# Patient Record
Sex: Male | Born: 1950
Health system: Southern US, Community
[De-identification: ages and names within clinical notes are randomized; demographics above are authoritative.]

## PROBLEM LIST (undated history)

## (undated) DIAGNOSIS — R7301 Impaired fasting glucose: Secondary | ICD-10-CM

## (undated) DIAGNOSIS — K625 Hemorrhage of anus and rectum: Secondary | ICD-10-CM

## (undated) DIAGNOSIS — N200 Calculus of kidney: Secondary | ICD-10-CM

## (undated) DIAGNOSIS — K219 Gastro-esophageal reflux disease without esophagitis: Secondary | ICD-10-CM

## (undated) DIAGNOSIS — T07XXXA Unspecified multiple injuries, initial encounter: Secondary | ICD-10-CM

## (undated) DIAGNOSIS — C4490 Unspecified malignant neoplasm of skin, unspecified: Secondary | ICD-10-CM

## (undated) DIAGNOSIS — K648 Other hemorrhoids: Secondary | ICD-10-CM

## (undated) HISTORY — DX: Impaired fasting glucose: R73.01

## (undated) HISTORY — PX: COLONOSCOPY: SHX174

## (undated) HISTORY — DX: Unspecified malignant neoplasm of skin, unspecified: C44.90

## (undated) HISTORY — DX: Gastro-esophageal reflux disease without esophagitis: K21.9

## (undated) HISTORY — PX: POLYPECTOMY: SHX149

## (undated) HISTORY — DX: Unspecified multiple injuries, initial encounter: T07.XXXA

## (undated) HISTORY — DX: Other hemorrhoids: K64.8

## (undated) HISTORY — DX: Calculus of kidney: N20.0

## (undated) HISTORY — DX: Gilbert syndrome: E80.4

## (undated) HISTORY — DX: Hemorrhage of anus and rectum: K62.5

---

## 2001-09-29 ENCOUNTER — Encounter: Admission: RE | Admit: 2001-09-29 | Discharge: 2001-09-29 | Payer: Self-pay | Admitting: Internal Medicine

## 2001-09-29 ENCOUNTER — Encounter: Payer: Self-pay | Admitting: Internal Medicine

## 2001-10-01 HISTORY — PX: UPPER GASTROINTESTINAL ENDOSCOPY: SHX188

## 2001-10-01 HISTORY — PX: COLONOSCOPY: SHX174

## 2001-10-01 HISTORY — PX: CHOLECYSTECTOMY: SHX55

## 2003-09-06 ENCOUNTER — Encounter: Payer: Self-pay | Admitting: Internal Medicine

## 2005-01-08 ENCOUNTER — Ambulatory Visit: Payer: Self-pay | Admitting: Internal Medicine

## 2005-01-15 ENCOUNTER — Encounter: Admission: RE | Admit: 2005-01-15 | Discharge: 2005-01-15 | Payer: Self-pay | Admitting: Internal Medicine

## 2005-01-15 ENCOUNTER — Ambulatory Visit: Payer: Self-pay | Admitting: Internal Medicine

## 2005-03-30 ENCOUNTER — Ambulatory Visit: Payer: Self-pay | Admitting: Internal Medicine

## 2005-10-01 HISTORY — PX: LITHOTRIPSY: SUR834

## 2005-11-26 ENCOUNTER — Ambulatory Visit: Payer: Self-pay | Admitting: Internal Medicine

## 2005-11-29 ENCOUNTER — Encounter: Admission: RE | Admit: 2005-11-29 | Discharge: 2005-11-29 | Payer: Self-pay | Admitting: Internal Medicine

## 2006-02-04 ENCOUNTER — Ambulatory Visit: Payer: Self-pay | Admitting: Internal Medicine

## 2006-07-12 ENCOUNTER — Ambulatory Visit: Payer: Self-pay | Admitting: Internal Medicine

## 2006-09-09 ENCOUNTER — Ambulatory Visit: Payer: Self-pay | Admitting: Internal Medicine

## 2006-09-09 LAB — CONVERTED CEMR LAB
ALT: 28 units/L (ref 0–40)
AST: 26 units/L (ref 0–37)
BUN: 29 mg/dL — ABNORMAL HIGH (ref 6–23)
Chol/HDL Ratio, serum: 4.8
Cholesterol: 159 mg/dL (ref 0–200)
Creatinine, Ser: 1.3 mg/dL (ref 0.4–1.5)
Glucose, Bld: 101 mg/dL — ABNORMAL HIGH (ref 70–99)
HDL: 33.1 mg/dL — ABNORMAL LOW (ref >39.0)
LDL Cholesterol: 95 mg/dL (ref 0–99)
PSA: 0.39 ng/mL (ref 0.10–4.00)
Potassium: 3.9 meq/L (ref 3.5–5.1)
Triglyceride fasting, serum: 153 mg/dL — ABNORMAL HIGH (ref 0–149)
VLDL: 31 mg/dL (ref 0–40)

## 2006-12-30 ENCOUNTER — Ambulatory Visit: Payer: Self-pay | Admitting: Internal Medicine

## 2007-02-25 DIAGNOSIS — Z87442 Personal history of urinary calculi: Secondary | ICD-10-CM | POA: Insufficient documentation

## 2007-02-25 DIAGNOSIS — M545 Low back pain: Secondary | ICD-10-CM

## 2007-04-07 ENCOUNTER — Ambulatory Visit: Payer: Self-pay | Admitting: Internal Medicine

## 2007-04-13 LAB — CONVERTED CEMR LAB
ALT: 22 units/L (ref 0–53)
AST: 21 units/L (ref 0–37)
Albumin: 3.8 g/dL (ref 3.5–5.2)
Alkaline Phosphatase: 55 units/L (ref 39–117)
BUN: 20 mg/dL (ref 6–23)
Basophils Absolute: 0 10*3/uL (ref 0.0–0.1)
Basophils Relative: 0.6 % (ref 0.0–1.0)
Bilirubin, Direct: 0.1 mg/dL (ref 0.0–0.3)
CO2: 26 meq/L (ref 19–32)
Calcium: 9 mg/dL (ref 8.4–10.5)
Chloride: 110 meq/L (ref 96–112)
Cholesterol: 157 mg/dL (ref 0–200)
Creatinine, Ser: 1.2 mg/dL (ref 0.4–1.5)
Direct LDL: 90 mg/dL
Eosinophils Absolute: 0.1 10*3/uL (ref 0.0–0.6)
Eosinophils Relative: 2.5 % (ref 0.0–5.0)
GFR calc Af Amer: 81 mL/min
GFR calc non Af Amer: 67 mL/min
Glucose, Bld: 108 mg/dL — ABNORMAL HIGH (ref 70–99)
HCT: 43.5 % (ref 39.0–52.0)
HDL: 24.2 mg/dL — ABNORMAL LOW (ref >39.0)
Hemoglobin: 15 g/dL (ref 13.0–17.0)
Lymphocytes Relative: 22.6 % (ref 12.0–46.0)
MCHC: 34.4 g/dL (ref 30.0–36.0)
MCV: 90.3 fL (ref 78.0–100.0)
Monocytes Absolute: 0.4 10*3/uL (ref 0.2–0.7)
Monocytes Relative: 7.6 % (ref 3.0–11.0)
Neutro Abs: 3.8 10*3/uL (ref 1.4–7.7)
Neutrophils Relative %: 66.7 % (ref 43.0–77.0)
Platelets: 138 10*3/uL — ABNORMAL LOW (ref 150–400)
Potassium: 3.9 meq/L (ref 3.5–5.1)
RBC: 4.82 M/uL (ref 4.22–5.81)
RDW: 12 % (ref 11.5–14.6)
Sodium: 142 meq/L (ref 135–145)
TSH: 0.93 microintl units/mL (ref 0.35–5.50)
Total Bilirubin: 1.5 mg/dL — ABNORMAL HIGH (ref 0.3–1.2)
Total CHOL/HDL Ratio: 6.5
Total Protein: 6.1 g/dL (ref 6.0–8.3)
Triglycerides: 269 mg/dL (ref 0–149)
VLDL: 54 mg/dL — ABNORMAL HIGH (ref 0–40)
WBC: 5.6 10*3/uL (ref 4.5–10.5)

## 2007-04-14 ENCOUNTER — Encounter (INDEPENDENT_AMBULATORY_CARE_PROVIDER_SITE_OTHER): Payer: Self-pay | Admitting: *Deleted

## 2007-04-21 ENCOUNTER — Ambulatory Visit: Payer: Self-pay | Admitting: Internal Medicine

## 2007-04-21 DIAGNOSIS — N401 Enlarged prostate with lower urinary tract symptoms: Secondary | ICD-10-CM

## 2007-04-21 DIAGNOSIS — T887XXA Unspecified adverse effect of drug or medicament, initial encounter: Secondary | ICD-10-CM | POA: Insufficient documentation

## 2007-04-24 ENCOUNTER — Encounter (INDEPENDENT_AMBULATORY_CARE_PROVIDER_SITE_OTHER): Payer: Self-pay | Admitting: *Deleted

## 2007-09-01 ENCOUNTER — Encounter: Payer: Self-pay | Admitting: Internal Medicine

## 2007-09-08 ENCOUNTER — Ambulatory Visit: Payer: Self-pay | Admitting: Internal Medicine

## 2007-09-08 DIAGNOSIS — E785 Hyperlipidemia, unspecified: Secondary | ICD-10-CM | POA: Insufficient documentation

## 2007-09-08 DIAGNOSIS — E1169 Type 2 diabetes mellitus with other specified complication: Secondary | ICD-10-CM | POA: Insufficient documentation

## 2007-09-08 DIAGNOSIS — I10 Essential (primary) hypertension: Secondary | ICD-10-CM

## 2007-09-08 DIAGNOSIS — K219 Gastro-esophageal reflux disease without esophagitis: Secondary | ICD-10-CM

## 2007-10-17 ENCOUNTER — Encounter (INDEPENDENT_AMBULATORY_CARE_PROVIDER_SITE_OTHER): Payer: Self-pay | Admitting: *Deleted

## 2007-10-17 ENCOUNTER — Ambulatory Visit: Payer: Self-pay | Admitting: Internal Medicine

## 2007-11-10 ENCOUNTER — Ambulatory Visit: Payer: Self-pay | Admitting: Gastroenterology

## 2007-12-22 ENCOUNTER — Encounter: Payer: Self-pay | Admitting: Internal Medicine

## 2007-12-29 ENCOUNTER — Encounter: Payer: Self-pay | Admitting: Gastroenterology

## 2007-12-29 ENCOUNTER — Ambulatory Visit: Payer: Self-pay | Admitting: Gastroenterology

## 2008-09-22 ENCOUNTER — Telehealth: Payer: Self-pay | Admitting: Internal Medicine

## 2008-09-27 ENCOUNTER — Ambulatory Visit: Payer: Self-pay | Admitting: Internal Medicine

## 2008-09-27 ENCOUNTER — Telehealth (INDEPENDENT_AMBULATORY_CARE_PROVIDER_SITE_OTHER): Payer: Self-pay | Admitting: *Deleted

## 2008-09-28 LAB — CONVERTED CEMR LAB: PSA: 0.37 ng/mL (ref 0.10–4.00)

## 2008-09-29 ENCOUNTER — Encounter (INDEPENDENT_AMBULATORY_CARE_PROVIDER_SITE_OTHER): Payer: Self-pay | Admitting: *Deleted

## 2008-11-04 ENCOUNTER — Ambulatory Visit: Payer: Self-pay | Admitting: Internal Medicine

## 2008-11-04 DIAGNOSIS — N1831 Chronic kidney disease, stage 3a: Secondary | ICD-10-CM | POA: Insufficient documentation

## 2008-11-04 DIAGNOSIS — R7303 Prediabetes: Secondary | ICD-10-CM | POA: Insufficient documentation

## 2008-11-04 DIAGNOSIS — R739 Hyperglycemia, unspecified: Secondary | ICD-10-CM

## 2008-11-04 LAB — CONVERTED CEMR LAB
Cholesterol, target level: 200 mg/dL
LDL Goal: 130 mg/dL

## 2008-11-05 ENCOUNTER — Encounter: Payer: Self-pay | Admitting: Internal Medicine

## 2008-11-15 ENCOUNTER — Ambulatory Visit: Payer: Self-pay | Admitting: Internal Medicine

## 2008-12-04 LAB — CONVERTED CEMR LAB
ALT: 24 units/L (ref 0–53)
AST: 25 units/L (ref 0–37)
Eosinophils Relative: 2.4 % (ref 0.0–5.0)
HCT: 43.7 % (ref 39.0–52.0)
Hemoglobin: 15.6 g/dL (ref 13.0–17.0)
Hgb A1c MFr Bld: 4.9 % (ref 4.6–6.0)
LDL Cholesterol: 108 mg/dL — ABNORMAL HIGH (ref 0–99)
Lymphocytes Relative: 22.8 % (ref 12.0–46.0)
Monocytes Absolute: 0.3 10*3/uL (ref 0.1–1.0)
Monocytes Relative: 5.3 % (ref 3.0–12.0)
Neutro Abs: 4.4 10*3/uL (ref 1.4–7.7)
Potassium: 3.9 meq/L (ref 3.5–5.1)
RBC: 4.78 M/uL (ref 4.22–5.81)
TSH: 0.9 microintl units/mL (ref 0.35–5.50)
Total Bilirubin: 1.4 mg/dL — ABNORMAL HIGH (ref 0.3–1.2)
Total CHOL/HDL Ratio: 5.8
Total Protein: 6.5 g/dL (ref 6.0–8.3)
WBC: 6.5 10*3/uL (ref 4.5–10.5)

## 2008-12-07 ENCOUNTER — Encounter (INDEPENDENT_AMBULATORY_CARE_PROVIDER_SITE_OTHER): Payer: Self-pay | Admitting: *Deleted

## 2009-08-04 ENCOUNTER — Telehealth (INDEPENDENT_AMBULATORY_CARE_PROVIDER_SITE_OTHER): Payer: Self-pay | Admitting: *Deleted

## 2009-08-09 ENCOUNTER — Telehealth (INDEPENDENT_AMBULATORY_CARE_PROVIDER_SITE_OTHER): Payer: Self-pay | Admitting: *Deleted

## 2009-08-17 ENCOUNTER — Encounter: Payer: Self-pay | Admitting: Internal Medicine

## 2009-09-29 ENCOUNTER — Ambulatory Visit: Payer: Self-pay | Admitting: Internal Medicine

## 2009-09-30 LAB — CONVERTED CEMR LAB: PSA: 0.49 ng/mL (ref 0.10–4.00)

## 2009-10-03 ENCOUNTER — Encounter (INDEPENDENT_AMBULATORY_CARE_PROVIDER_SITE_OTHER): Payer: Self-pay | Admitting: *Deleted

## 2009-11-07 ENCOUNTER — Ambulatory Visit: Payer: Self-pay | Admitting: Internal Medicine

## 2009-11-07 DIAGNOSIS — Z85828 Personal history of other malignant neoplasm of skin: Secondary | ICD-10-CM

## 2009-11-07 DIAGNOSIS — D126 Benign neoplasm of colon, unspecified: Secondary | ICD-10-CM

## 2009-11-14 ENCOUNTER — Ambulatory Visit: Payer: Self-pay | Admitting: Internal Medicine

## 2009-11-14 DIAGNOSIS — J069 Acute upper respiratory infection, unspecified: Secondary | ICD-10-CM | POA: Insufficient documentation

## 2009-11-18 LAB — CONVERTED CEMR LAB
Albumin: 3.7 g/dL (ref 3.5–5.2)
Basophils Relative: 0.2 % (ref 0.0–3.0)
CO2: 30 meq/L (ref 19–32)
Calcium: 8.9 mg/dL (ref 8.4–10.5)
Creatinine, Ser: 1.1 mg/dL (ref 0.4–1.5)
Eosinophils Absolute: 0.1 10*3/uL (ref 0.0–0.7)
Eosinophils Relative: 1.5 % (ref 0.0–5.0)
GFR calc non Af Amer: 73.22 mL/min (ref >60)
Glucose, Bld: 93 mg/dL (ref 70–99)
HDL: 32.2 mg/dL — ABNORMAL LOW (ref >39.00)
Hemoglobin: 14.6 g/dL (ref 13.0–17.0)
Lymphocytes Relative: 24.7 % (ref 12.0–46.0)
Monocytes Relative: 10.7 % (ref 3.0–12.0)
Neutro Abs: 2.4 10*3/uL (ref 1.4–7.7)
Neutrophils Relative %: 62.9 % (ref 43.0–77.0)
PSA: 0.41 ng/mL (ref 0.10–4.00)
RBC: 4.55 M/uL (ref 4.22–5.81)
Sodium: 143 meq/L (ref 135–145)
TSH: 1.47 microintl units/mL (ref 0.35–5.50)
Total CHOL/HDL Ratio: 4
Total Protein: 6.4 g/dL (ref 6.0–8.3)
Triglycerides: 151 mg/dL — ABNORMAL HIGH (ref 0.0–149.0)
VLDL: 30.2 mg/dL (ref 0.0–40.0)
WBC: 3.9 10*3/uL — ABNORMAL LOW (ref 4.5–10.5)

## 2010-10-29 LAB — CONVERTED CEMR LAB
Hgb A1c MFr Bld: 4.5 % — ABNORMAL LOW (ref 4.6–6.0)
Microalb Creat Ratio: 1.4 mg/g (ref 0.0–30.0)
PSA: 0.52 ng/mL (ref 0.10–4.00)

## 2010-10-31 NOTE — Assessment & Plan Note (Signed)
Summary: MED REFILL//PH   Vital Signs:  Patient profile:   60 year old male Height:      72 inches Weight:      246.6 pounds BMI:     33.57 Temp:     98.4 degrees F oral Pulse rate:   82 / minute Resp:     16 per minute BP sitting:   140 / 82  (left arm) Cuff size:   large  Vitals Entered By: Shonna Chock (November 07, 2009 1:09 PM)  CC: Yearly follow-up on medication, Discuss when colonoscopy should be rechecked, General Medical Evaluation Comments REVIEWED MED LIST, PATIENT AGREED DOSE AND INSTRUCTION CORRECT    CC:  Yearly follow-up on medication, Discuss when colonoscopy should be rechecked, and General Medical Evaluation.  History of Present Illness: Richard Trevino is here for a physical ; he is asymptomatic except for minor headaches . No Rx to date; he questions relationship to decreased vision.  Allergies: 1)  ! * Niaspan  Past History:  Past Medical History: Low back pain Multiple fractures:coccyx, finger,hand [phone] abnormal blood chemistry nec,790.29 (fasting hyperglycemia) hyperplasia of prostate  w/o urinary obstruction Hyperlipidemia Hypertension nephrolithiasis, history of X 2 in  2004, 2006 Gilberts syndrome  GERD Skin cancer, hx of, Basal Cell X1, Dr. Maree Erie  Past Surgical History: Endoscopy  :gastritis 2003; lithrotripsy 2007, Dr Edwin Cap Cholecystectomy 1994 Colon polypectomy 2003,Dr Russella Dar, ? due 2013 Endo neg 2009  Family History: Father: emphysema, brown lung,?HTN,CVA; Mother:myeloma ,HTN,arthritis; Siblings:bro  skin CA, prostate CA, CVA,depression,cirrhosis; M uncles MI ( not pre 77)  Social History: Married Former Smoker: quit @ 18 Alcohol use-no No diet Occupation:Truck Driver  Review of Systems       The patient complains of vision loss.  The patient denies anorexia, fever, decreased hearing, hoarseness, chest pain, syncope, dyspnea on exertion, prolonged cough, hemoptysis, abdominal pain, melena, hematochezia, severe  indigestion/heartburn, hematuria, incontinence, suspicious skin lesions, depression, unusual weight change, abnormal bleeding, enlarged lymph nodes, and angioedema.         Weight up 6# over past year. Hand stiff while driving, ? minimal swelling.  Physical Exam  General:  well-nourished,in no acute distress; alert,appropriate and cooperative throughout examination Head:  Normocephalic and atraumatic without obvious abnormalities. pattern  alopecia ; moustache & beard Eyes:  No corneal or conjunctival inflammation noted.Perrla. Funduscopic exam benign, without hemorrhages, exudates or papilledema.  Ears:  External ear exam shows no significant lesions or deformities.  Otoscopic examination reveals clear canals, tympanic membranes are intact bilaterally without bulging, retraction, inflammation or discharge. Hearing is grossly normal bilaterally. Nose:  External nasal examination shows no deformity or inflammation. Nasal mucosa are pink and moist without lesions or exudates. Mouth:  Oral mucosa and oropharynx without lesions or exudates.  Teeth in good repair. Neck:  No deformities, masses, or tenderness noted. Lungs:  Normal respiratory effort, chest expands symmetrically. Lungs are clear to auscultation, no crackles or wheezes. Heart:  Normal rate and regular rhythm. S1 and S2 normal without gallop, murmur, click, rub . S4 with slurring Abdomen:  Bowel sounds positive,abdomen soft and non-tender without masses, organomegaly or hernias noted. Rectal:  No external abnormalities noted. Normal sphincter tone. No rectal masses or tenderness. Genitalia:  Testes bilaterally descended without nodularity, tenderness or masses. No scrotal masses or lesions. No penis lesions or urethral discharge. Prostate:  Prostate gland firm and smooth, no enlargement, nodularity, tenderness, mass, asymmetry or induration. Msk:  No deformity or scoliosis noted of thoracic or lumbar spine.  Pulses:  R and L  carotid,radial,dorsalis pedis and posterior tibial pulses are full and equal bilaterally Extremities:  No clubbing, cyanosis, edema, or deformity noted with normal full range of motion of all joints.  Minor fungal toenail change Neurologic:  alert & oriented X3 and DTRs symmetrical and normal.   Skin:  Intact without suspicious lesions or rashes.Op scar RU back , well healed. Minor boss above OD Cervical Nodes:  No lymphadenopathy noted Axillary Nodes:  No palpable lymphadenopathy Inguinal Nodes:  No significant adenopathy Psych:  memory intact for recent and remote, normally interactive, and good eye contact.     Impression & Recommendations:  Problem # 1:  PREVENTIVE HEALTH CARE (ICD-V70.0)  Orders: EKG w/ Interpretation (93000)  Problem # 2:  FASTING HYPERGLYCEMIA (ICD-790.29)  Problem # 3:  HYPERTENSION (ICD-401.9)  His updated medication list for this problem includes:    Hydrochlorothiazide 25 Mg Tabs (Hydrochlorothiazide) .Marland Kitchen... 1/2 tab qd  Orders: EKG w/ Interpretation (93000)  Problem # 4:  HYPERLIPIDEMIA (ICD-272.4)  His updated medication list for this problem includes:    Pravachol 20 Mg Tabs (Pravastatin sodium) .Marland Kitchen... Take 1/2 tab qhs  Problem # 5:  NEOPLASM, MALIGNANT, PROSTATE, FAMILY HX (ICD-V16.42) brother  Problem # 6:  SKIN CANCER, HX OF (ICD-V10.83) as per Derm  Problem # 7:  COLONIC POLYPS (ICD-211.3)  Complete Medication List: 1)  Pravachol 20 Mg Tabs (Pravastatin sodium) .... Take 1/2 tab qhs 2)  Fluoxetine Hcl 20 Mg Caps (Fluoxetine hcl) .Marland Kitchen.. 1 by mouth qd 3)  Hydrochlorothiazide 25 Mg Tabs (Hydrochlorothiazide) .... 1/2 tab qd 4)  Losartan Potassium 100 Mg  .... 1/2 -1  once daily to keep bp < 135/85 on average 5)  Flomax 0.4 Mg Cp24 (Tamsulosin hcl) .Marland Kitchen.. 1 by mouth qd 6)  Gnp Flax Seed Oil 1000 Mg Caps (Flaxseed (linseed)) .... Daily 7)  Kapidex 60 Mg Cpdr (Dexlansoprazole) .Marland Kitchen.. 1 q am 8)  Daily Multi Tabs (Multiple vitamins-minerals) .Marland Kitchen.. 1 by  mouth once daily 9)  Benadryl 25 Mg Tabs (Diphenhydramine hcl) .... 3 by mouth once daily  Patient Instructions: 1)  Avoid foods high in acid (tomatoes, citrus juices, spicy foods). Avoid eating within two hours of lying down or before exercising. Do not over eat; try smaller more frequent meals. Elevate head of bed twelve inches when sleeping. Verify when Colonoscopy due with Dr Ardell Isaacs. Schedule fasting labs: 2)  BMP ;Hepatic Panel ;Lipid Panel ;TSH ;CBC w/ Diff ;PSA ;HbgA1C . Consume LESS THAN 40 grams of sugar/ day from foods & drinks with High Fructose Corn Syrup as #1,2 or #3 on label. 3)  Check your Blood Pressure regularly. If it is above:135/85  you should icrease Losartan to 2100mg  once daily from 50 mg once daily  Prescriptions: KAPIDEX 60 MG CPDR (DEXLANSOPRAZOLE) 1 q am  #30 x 2   Entered and Authorized by:   Marga Melnick MD   Signed by:   Marga Melnick MD on 11/07/2009   Method used:   Print then Mail to Patient   RxID:   [id number] FLOMAX 0.4 MG  CP24 (TAMSULOSIN HCL) 1 by mouth qd  #90 Capsule x 3   Entered and Authorized by:   Marga Melnick MD   Signed by:   Marga Melnick MD on 11/07/2009   Method used:   Print then Mail to Patient   RxID:   [id number] HYDROCHLOROTHIAZIDE 25 MG  TABS (HYDROCHLOROTHIAZIDE) 1/2 tab qd  #90 x 1   Entered and Authorized by:  Marga Melnick MD   Signed by:   Marga Melnick MD on 11/07/2009   Method used:   Print then Mail to Patient   RxID:   [id number] FLUOXETINE HCL 20 MG  CAPS (FLUOXETINE HCL) 1 by mouth qd  #90 Capsule x 3   Entered and Authorized by:   Marga Melnick MD   Signed by:   Marga Melnick MD on 11/07/2009   Method used:   Print then Mail to Patient   RxID:   [id number] PRAVACHOL 20 MG  TABS (PRAVASTATIN SODIUM) TAKE 1/2 tab qhs  #90 x 1   Entered and Authorized by:   Marga Melnick MD   Signed by:   Marga Melnick MD on 11/07/2009   Method used:   Print then Mail to Patient   RxID:    [id number] LOSARTAN POTASSIUM 100 MG 1/2 -1  once daily to keep BP < 135/85 on AVERAGE  #30 x 11   Entered and Authorized by:   Marga Melnick MD   Signed by:   Marga Melnick MD on 11/07/2009   Method used:   Print then Mail to Patient   RxID:   [id number]    Immunization History:  Tetanus/Td Immunization History:    Tetanus/Td:  historical (03/30/2005)

## 2010-10-31 NOTE — Letter (Signed)
Summary: Results Follow up Letter  [facility] [place]  [address] [place], [place] [address]   Phone: [phone]  Fax: [phone]    10/03/2009 MRN: [id number]  Richard Trevino [address] [place], [place]  [address]  Dear Mr. CRAVER,  The following are the results of your recent test(s):  Test         Result    Pap Smear:        Normal _____  Not Normal _____ Comments: ______________________________________________________ Cholesterol: LDL(Bad cholesterol):         Your goal is less than:         HDL (Good cholesterol):       Your goal is more than: Comments:  ______________________________________________________ Mammogram:        Normal _____  Not Normal _____ Comments:  ___________________________________________________________________ Hemoccult:        Normal _____  Not normal _______ Comments:    _____________________________________________________________________ Other Tests: Please see attached labs done on 09/29/2009    We routinely do not discuss normal results over the telephone.  If you desire a copy of the results, or you have any questions about this information we can discuss them at your next office visit.   Sincerely,

## 2010-10-31 NOTE — Assessment & Plan Note (Signed)
Summary: possible flu//lch   Vital Signs:  Patient profile:   60 year old male Weight:      242 pounds Temp:     98.7 degrees F oral Pulse rate:   84 / minute Resp:     14 per minute BP sitting:   124 / 78  (left arm) Cuff size:   large  Vitals Entered By: Shonna Chock (November 14, 2009 2:56 PM) CC: Cough, fatigue, wheezing, tightness in head(headache) and a fever( x 1 day-Thursday) Comments REVIEWED MED LIST, PATIENT AGREED DOSE AND INSTRUCTION CORRECT    CC:  Cough, fatigue, wheezing, and tightness in head(headache) and a fever( x 1 day-Thursday).  History of Present Illness: Onset 11/08/2009 as NP cough , malaise. Fever to ? 02/10 with chills.Now  residual frontal headache, NP cough with wheezing. Rx: OTC flu meds. He does not take flu shot.  Allergies: 1)  ! * Niaspan  Review of Systems General:  Complains of fever and sweats; denies chills. ENT:  Complains of nasal congestion and sinus pressure; Facial pain w/o purulence. Resp:  Complains of cough; denies shortness of breath and sputum productive; No purulence.  Physical Exam  General:  well-nourished,in no acute distress; alert,appropriate and cooperative throughout examination Ears:  External ear exam shows no significant lesions or deformities.  Otoscopic examination reveals clear canals, tympanic membranes are intact bilaterally without bulging, retraction, inflammation or discharge. Hearing is grossly normal bilaterally. Nose:  External nasal examination shows no deformity or inflammation. Nasal mucosa are pink and moist without lesions or exudates. septal dislocation Mouth:  Oral mucosa and oropharynx without lesions or exudates.  Teeth in good repair. Lungs:  Normal respiratory effort, chest expands symmetrically. Lungs are clear to auscultation, no crackles or wheezes. Minor cough Cervical Nodes:  No lymphadenopathy noted Axillary Nodes:  No palpable lymphadenopathy   Impression & Recommendations:  Problem #  1:  BRONCHITIS-ACUTE (ICD-466.0)  His updated medication list for this problem includes:    Azithromycin 250 Mg Tabs (Azithromycin) .Marland Kitchen... As per pack  Problem # 2:  URI (ICD-465.9)  His updated medication list for this problem includes:    Benadryl 25 Mg Tabs (Diphenhydramine hcl) .Marland KitchenMarland KitchenMarland KitchenMarland Kitchen 3 by mouth once daily  Complete Medication List: 1)  Pravachol 20 Mg Tabs (Pravastatin sodium) .... Take 1/2 tab qhs 2)  Fluoxetine Hcl 20 Mg Caps (Fluoxetine hcl) .Marland Kitchen.. 1 by mouth qd 3)  Hydrochlorothiazide 25 Mg Tabs (Hydrochlorothiazide) .... 1/2 tab qd 4)  Losartan Potassium 100 Mg  .... 1/2 -1  once daily to keep bp < 135/85 on average 5)  Flomax 0.4 Mg Cp24 (Tamsulosin hcl) .Marland Kitchen.. 1 by mouth qd 6)  Gnp Flax Seed Oil 1000 Mg Caps (Flaxseed (linseed)) .... Daily 7)  Dexilant 60 Mg Cpdr (dexlansoprazole)  .Marland Kitchen.. 1 q am 8)  Daily Multi Tabs (Multiple vitamins-minerals) .Marland Kitchen.. 1 by mouth once daily 9)  Benadryl 25 Mg Tabs (Diphenhydramine hcl) .... 3 by mouth once daily 10)  Azithromycin 250 Mg Tabs (Azithromycin) .... As per pack  Patient Instructions: 1)  Advair  (sample) one inhalation every 12 hrs ; gargle & spit after use. Cough syrup if no better with Advair. 2)  Drink as much fluid as you can tolerate for the next few days. Prescriptions: AZITHROMYCIN 250 MG TABS (AZITHROMYCIN) as per pack  #1 x 0   Entered and Authorized by:   Marga Melnick MD   Signed by:   Marga Melnick MD on 11/14/2009   Method used:   Arneta Cliche  to ..Marland Kitchen       [address] [phone]* (retail)       [address]       [place], [place]  [address]       Ph: [phone]       Fax: [phone]   RxID:   [id number]

## 2010-11-20 ENCOUNTER — Encounter (INDEPENDENT_AMBULATORY_CARE_PROVIDER_SITE_OTHER): Payer: Self-pay | Admitting: *Deleted

## 2010-11-28 NOTE — Letter (Signed)
Summary: Primary Care Appointment Letter  [facility] [place]  [address] [place], [place] [address]   Phone: [phone]  Fax: [phone]    11/20/2010 MRN: [id number]  NECO KLING [address] [place], [place]  [address]  Dear Mr. HASZ,   Your Primary Care Physician Marga Melnick MD has indicated that:    ___X____it is time to schedule an appointment(Last office visit 11/2009, please call to schedule appointment) An appointment is necessary to continue refilling meds .    _______you missed your appointment on______ and need to call and          reschedule.    _______you need to have lab work done.    _______you need to schedule an appointment discuss lab or test results.    _______you need to call to reschedule your appointment that is                       scheduled on _________.     Please call our office as soon as possible. Our phone number is [phone]. Please press option 1. Our office is open 8a-5p, Monday through Friday.     Thank you,    [facility]

## 2010-12-05 ENCOUNTER — Encounter: Payer: Self-pay | Admitting: Internal Medicine

## 2010-12-28 ENCOUNTER — Other Ambulatory Visit: Payer: Self-pay | Admitting: Internal Medicine

## 2011-01-18 ENCOUNTER — Other Ambulatory Visit: Payer: Self-pay | Admitting: Internal Medicine

## 2011-01-22 ENCOUNTER — Ambulatory Visit (INDEPENDENT_AMBULATORY_CARE_PROVIDER_SITE_OTHER): Payer: 59 | Admitting: Internal Medicine

## 2011-01-22 ENCOUNTER — Encounter: Payer: Self-pay | Admitting: Internal Medicine

## 2011-01-22 DIAGNOSIS — R079 Chest pain, unspecified: Secondary | ICD-10-CM

## 2011-01-22 DIAGNOSIS — E785 Hyperlipidemia, unspecified: Secondary | ICD-10-CM

## 2011-01-22 DIAGNOSIS — I10 Essential (primary) hypertension: Secondary | ICD-10-CM

## 2011-01-22 DIAGNOSIS — N429 Disorder of prostate, unspecified: Secondary | ICD-10-CM

## 2011-01-22 DIAGNOSIS — Z0000 Encounter for general adult medical examination without abnormal findings: Secondary | ICD-10-CM

## 2011-01-22 NOTE — Assessment & Plan Note (Signed)
NMR Lipoprofile 2004: LDL 144(1924/1216), HDL 32,TG 322. LDL goal= <110

## 2011-01-22 NOTE — Progress Notes (Signed)
Addended by: Stephan Minister on: 01/22/2011 02:39 PM   Modules accepted: Orders

## 2011-01-22 NOTE — Progress Notes (Signed)
Subjective:    Patient ID: Richard Trevino, male    DOB: 02-Nov-1950, 60 y.o.   MRN: [id number]  HPI He is here for a physical ; he does describe occasional chest pain when "tired", on average once / week. CHEST PAIN : Location: L anterior  Quality: burning Duration: 15-20 min Onset (rest, exertion): exertion Radiation: no Better with: no treatment Worse with: nothing definitely Symptoms History of Trauma/lifting: no  Nausea/vomiting: no  Diaphoresis: no Shortness of breath: yes, occasionally  Pleuritic: yes  Cough: no  Edema: yes, after sitting for prolonged period  PND: no  Dizziness: no  Palpitations: no, (see PACs on EKG)  Syncope: no  Indigestion: no  Red Flags Worse with exertion: yes  Recent Immobility: no  FH: brother CVA;  M uncles MI in 37s  Review of Systems Patient reports no  vision changes,anorexia,  fever ,adenopathy, persistant / recurrent hoarseness, swallowing issues, persistant / recurrent cough, hemoptysis,  gastrointestinal  bleeding (melena, rectal bleeding), abdominal pain,GU symptoms( dysuria, hematuria, pyuria, voiding/incontinence  issues)  focal weakness, memory loss,numbness & tingling, skin/hair/nail changes,depression, anxiety, abnormal bruising/bleeding, musculoskeletal symptoms/signs.   He does believe  He has had  some hearing loss. He has gained 20 pounds since his last visit.      Objective:   Physical Exam Gen.: Healthy and well-nourished in appearance. Alert, appropriate and cooperative throughout exam. Head: Normocephalic without obvious abnormalities; patternalopecia  Eyes: No corneal or conjunctival inflammation noted. Pupils equal round reactive to light and accommodation. Fundal exam is benign without hemorrhages, exudate, papilledema. Extraocular motion intact. Vision grossly normal. Ears: External  ear exam reveals no significant lesions or deformities. Canals clear .TMs normal. Hearing is grossly normal bilaterally. Nose: External  nasal exam reveals no deformity or inflammation. Nasal mucosa are pink and moist. No lesions or exudates noted. Septum  slightly dislocated to L  Mouth: Oral mucosa and oropharynx reveal no lesions or exudates. Teeth in good repair. Neck: No deformities, masses, or tenderness noted. Range of motion normal. Thyroid : no nodules. Lungs: Normal respiratory effort; chest expands symmetrically. Lungs are clear to auscultation without rales, wheezes, or increased work of breathing. Heart: Normal rate and rhythm. Normal S1 and S2. No gallop, click, or rub. S4; no murmur. Abdomen: Bowel sounds normal; abdomen soft and nontender. No masses, organomegaly or hernias noted. Genitalia: There is a small varicocele on the left. There is mild asymmetry of the prostate with borderline  enlargement of the right lobe without nodules.                                                                                     Musculoskeletal/extremities: No deformity or scoliosis noted of  the thoracic or lumbar spine. No clubbing, cyanosis, edema. Minor DIP  deformity noted 5th L finger. Range of motion  normal .Tone & strength  normal.Joints normal. Nail health  Good. Crepitus of knees Vascular: Carotid, radial artery, dorsalis pedis and dorsalis posterior tibial pulses are full and equal. No bruits present. Neurologic: Alert and oriented x3. Deep tendon reflexes symmetrical and normal.         Skin: Intact without suspicious lesions or rashes. Lymph: No  cervical, axillary, or inguinal lymphadenopathy present. Psych: Mood and affect are normal. Normally interactive                                                                                         Assessment & Plan:  #1 comprehensive physical examination  #2 chest pain  #3 PACs on EKG without ischemic changes  #4 dyslipidemia  #5 hypertension, controlled  Plan: #1  An advanced cholesterol panel will be collected to optimally assess any long-term risk.  #2 a  Bruce stress test will be performed to assess for chest pain.

## 2011-01-22 NOTE — Patient Instructions (Addendum)
Please schedule fasting labs: CBC and differential, PSA, Boston heart panel (1304X) Codes: V70.0, 786.50,272.4,602.9,530.10. A routine stress test will be completed to assess the chest pain. Please monitor your blood pressure regularly. Your goal is an average of 135/85 or less ideally.                    Preventive Health Care: Exercise at least 30-45 minutes a day,  3-4 days a week if stress test is negative.  Eat a low-fat diet with lots of fruits and vegetables, up to 7-9 servings per day. Avoid obesity; your goal is waist measurement < 40 inches.Consume less than 40 grams of sugar per day from foods & drinks with High Fructose Corn Sugar as #2,3 or # 4 on label. Seatbelts can save your life. Wear them always. Smoke detectors on every level of your home, check batteries every year. Eye Doctor - have an eye exam @ least annually.                                                                                 Health Care Power of Attorney & Living Will. Complete if not in place ; these place you in charge of your health care decisions.  The most common cause of elevated triglycerides is the ingestion of sugar from high fructose corn syrup sources. You should consume less than 40 grams  of sugar per day from foods and drinks with high fructose corn syrup as number 2, 3, or #4 on the label. As TG go up, HDL or good cholesterol goes down. Also uric acid which causes gout will go up.

## 2011-01-25 ENCOUNTER — Encounter: Payer: 59 | Admitting: Physician Assistant

## 2011-01-29 ENCOUNTER — Ambulatory Visit (INDEPENDENT_AMBULATORY_CARE_PROVIDER_SITE_OTHER): Payer: 59 | Admitting: Physician Assistant

## 2011-01-29 ENCOUNTER — Encounter: Payer: Self-pay | Admitting: Physician Assistant

## 2011-01-29 ENCOUNTER — Other Ambulatory Visit (INDEPENDENT_AMBULATORY_CARE_PROVIDER_SITE_OTHER): Payer: 59

## 2011-01-29 DIAGNOSIS — N429 Disorder of prostate, unspecified: Secondary | ICD-10-CM

## 2011-01-29 DIAGNOSIS — Z0000 Encounter for general adult medical examination without abnormal findings: Secondary | ICD-10-CM

## 2011-01-29 DIAGNOSIS — E785 Hyperlipidemia, unspecified: Secondary | ICD-10-CM

## 2011-01-29 DIAGNOSIS — R079 Chest pain, unspecified: Secondary | ICD-10-CM

## 2011-01-29 LAB — CBC WITH DIFFERENTIAL/PLATELET
Basophils Absolute: 0 10*3/uL (ref 0.0–0.1)
Eosinophils Absolute: 0.1 10*3/uL (ref 0.0–0.7)
HCT: 44.2 % (ref 39.0–52.0)
Lymphs Abs: 1.3 10*3/uL (ref 0.7–4.0)
MCV: 92.7 fl (ref 78.0–100.0)
Monocytes Absolute: 0.4 10*3/uL (ref 0.1–1.0)
Platelets: 137 10*3/uL — ABNORMAL LOW (ref 150.0–400.0)
RDW: 12.5 % (ref 11.5–14.6)

## 2011-01-29 NOTE — Progress Notes (Signed)
Exercise Treadmill Test  Pre-Exercise Testing Evaluation Rhythm: normal sinus  Rate: 88   PR:  .14 QRS:  .08  QT:  .34 QTc: .42     Test  Exercise Tolerance Test Ordering MD: Marga Melnick M.D  Interpreting MD:  Tereso Newcomer PA-C  Unique Test No: 1  Treadmill:  1  Indication for ETT: chest pain - rule out ischemia  Contraindication to ETT: No   Stress Modality: exercise - treadmill  Cardiac Imaging Performed: non   Protocol: standard Bruce - maximal  Max BP: 186/84  Max MPHR (bpm):  162 85% MPR (bpm):  137  MPHR obtained (bpm):160 % MPHR obtained:98  Reached 85% MPHR (min:sec):  3:31 Total Exercise Time (min-sec): 9:07  Workload in METS: 10.2 Borg Scale:15  Reason ETT Terminated:  desired heart rate attained    ST Segment Analysis At Rest: normal ST segments - no evidence of significant ST depression With Exercise: no evidence of significant ST depression  Other Information Arrhythmia:  No Angina during ETT:  absent (0) Quality of ETT:  diagnostic  ETT Interpretation:  normal - no evidence of ischemia by ST analysis  Comments: Good exercise tolerance. Normal BP response to exercise. No chest pain. No EKG changes to suggest ischemia. Few PACs noted.  Recommendations: Follow up with Dr. Alwyn Ren as directed.

## 2011-01-30 ENCOUNTER — Telehealth: Payer: Self-pay | Admitting: Physician Assistant

## 2011-01-30 NOTE — Telephone Encounter (Signed)
Pt Signed ROI, Picked up copy of Stress Test 01/29/11/km

## 2011-02-06 ENCOUNTER — Other Ambulatory Visit: Payer: Self-pay | Admitting: Internal Medicine

## 2011-02-08 ENCOUNTER — Other Ambulatory Visit: Payer: Self-pay | Admitting: Internal Medicine

## 2011-02-09 ENCOUNTER — Telehealth: Payer: Self-pay

## 2011-02-09 NOTE — Telephone Encounter (Signed)
Patient faxed over paperwork stating he has not received his blood work results and needs prescriptions filled. Patient is wondering if he was suppose to schedule an appointment in order to receive copy and discuss.   I called and left message on patient's home voicemail informing him he had a special cholesterol test and it takes a while (3-4 weeks) to come back and we will mail with Dr.Hopper's comments and patient is to prepare any questions and then schedule appointment once he receives booklet in mail. I informed patient to call pharmacy for refill request and to call our office if any questions about this phone message

## 2011-02-15 ENCOUNTER — Other Ambulatory Visit: Payer: Self-pay | Admitting: Internal Medicine

## 2011-02-16 ENCOUNTER — Other Ambulatory Visit: Payer: Self-pay | Admitting: Internal Medicine

## 2011-02-16 NOTE — Assessment & Plan Note (Signed)
[facility]                         GASTROENTEROLOGY OFFICE NOTE   Quandarius, Nill XAVYER STEENSON                    MRN:          [id number]  DATE:11/10/2007                            DOB:          [birth year]    REFERRING PHYSICIAN:  Titus Dubin. Hopper, MD,FACP,FCCP   PROBLEM:  Acid reflux and choking.   HISTORY:  Mr. Lama is a pleasant 60 year old white male known  previously to Dr. Russella Dar, who was last seen in 2003.  He had upper  endoscopy at that time, which did not show any evidence of a stricture.  He did have gastritis.  He also had colonoscopy at that same time,  negative except internal hemorrhoids.  He would be due for recall colon  in 2010.   The patient said that he has been on PPI therapy long term for acid  reflux, but has had difficulty over the past one year with frequent  coughing and choking, waking him from sleep.  He said he also wakes up  frequently with burning in his chest and epigastrium, and belching.  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  His wife said that he often eats a very heavy meal prior to going  to bed, and has changed his breakfast selections some recently with  lighter meals, and that has decreased his symptoms.  He had been taking  Protonix on a daily basis, was changed to a Protonix dissolvable form  per Dr. Alwyn Ren, but has only been taking this as needed over the past  few weeks.   Patient does have some breakthrough symptoms while at work as well with  heartburn and indigestion, though this does not happen on a daily basis,  does occasionally feel that his food is sticking, but he says that he  generally eats very fast and knows that he does not chew his food well.   CURRENT MEDICATIONS:  1. Pravachol 20 mg 1/2 tablet daily.  2. Fluoxetine 20 daily.  3. HCTZ 25 daily.  4. Cozaar 50 daily.  5. Flomax 0.4 daily.  6. Rozerem 8 mg 1/2 tablet at night.  7. Protonix  40 mg daily to q. 3 days.   ALLERGIES/INTOLERANCES:  Allergic to PLASTIC TAPE.   PAST MEDICAL HISTORY:  Hypertension  osteoarthritis  anxiety  depression  history of ureterolithiasis  prior skin cancer  status post cholecystectomy   FAMILY HISTORY:  Negative for colon cancer or polyps.  Positive for  heart disease in a brother and prostatic cancer in a brother.   SOCIAL HISTORY:  Patient is married, accompanied by his wife today.  He  is employed as a Freight forwarder.  Is a nonsmoker,  nondrinker.   REVIEW OF SYSTEMS:  GI as outlined above.  He does have problems with  osteoarthritis and low back pain.  Has some decreased auditory acuity,  and changes in urination, which he attributes to his prostate.   PHYSICAL EXAMINATION:  Well developed white male in no acute distress.  Height 5 feet 11 inches.  Weight is 235.8.  HEENT:  Nontraumatic, normocephalic.  EOMI.  PERRLA.  Sclerae anicteric.  CARDIOVASCULAR:  Regular rate and rhythm with S1 and S2.  ABDOMEN:  Obese, soft, nontender.  There is no palpable mass or  hepatosplenomegaly.  No guarding or rebound.  RECTAL:  Exam not done at this time.   IMPRESSION:  1. [age]-year-old white male with refractory gastrointestinal      reflux disease with nocturnal symptoms and intermittent dysphagia.      Rule out stricture and erosive esophagitis.  2. Colon neoplasia screening.  Patient is up-to-date on colonoscopy.  3. Status post cholecystectomy.   PLAN:  1. Patient was advised to use his proton pump inhibitor on a daily      basis.  We will put him back on Protonix 40 mg a.c. his last meal      before bedtime.  2. Schedule upper endoscopy with probable Savary dilation.  3. If daily therapy is not completely relieving his symptoms, we may      need to increase to b.i.d.  4. Bedblocks and standard antireflux measures advised.      Mike Gip, PA-C  Electronically Signed      Venita Lick. Russella Dar, MD, [facility]   Electronically Signed   AE/MedQ  DD: 11/10/2007  DT: 11/11/2007  Job #: [id number]   cc:   Titus Dubin. Alwyn Ren, MD,FACP,FCCP

## 2011-02-21 ENCOUNTER — Encounter: Payer: Self-pay | Admitting: Internal Medicine

## 2011-03-30 ENCOUNTER — Other Ambulatory Visit: Payer: Self-pay | Admitting: Internal Medicine

## 2011-05-11 ENCOUNTER — Other Ambulatory Visit: Payer: Self-pay | Admitting: Internal Medicine

## 2011-06-28 ENCOUNTER — Other Ambulatory Visit: Payer: Self-pay | Admitting: Internal Medicine

## 2011-07-13 ENCOUNTER — Other Ambulatory Visit: Payer: Self-pay | Admitting: Internal Medicine

## 2011-09-05 ENCOUNTER — Other Ambulatory Visit: Payer: Self-pay | Admitting: Internal Medicine

## 2011-10-11 ENCOUNTER — Other Ambulatory Visit: Payer: Self-pay | Admitting: Internal Medicine

## 2011-11-16 ENCOUNTER — Other Ambulatory Visit: Payer: Self-pay | Admitting: Internal Medicine

## 2011-12-24 ENCOUNTER — Other Ambulatory Visit: Payer: Self-pay | Admitting: Internal Medicine

## 2011-12-24 NOTE — Telephone Encounter (Signed)
Refill done.  

## 2012-01-18 ENCOUNTER — Other Ambulatory Visit: Payer: Self-pay | Admitting: Internal Medicine

## 2012-02-17 ENCOUNTER — Other Ambulatory Visit: Payer: Self-pay | Admitting: Internal Medicine

## 2012-02-18 NOTE — Telephone Encounter (Signed)
Patient needs to schedule a CPX  

## 2012-03-05 ENCOUNTER — Other Ambulatory Visit: Payer: Self-pay | Admitting: Internal Medicine

## 2012-03-12 ENCOUNTER — Other Ambulatory Visit: Payer: Self-pay | Admitting: *Deleted

## 2012-03-12 MED ORDER — TAMSULOSIN HCL 0.4 MG PO CAPS: ORAL_CAPSULE | ORAL | Status: DC

## 2012-03-12 NOTE — Progress Notes (Signed)
Correction refill for Rx Error/SLS

## 2012-03-24 ENCOUNTER — Other Ambulatory Visit: Payer: Self-pay | Admitting: Internal Medicine

## 2012-03-24 NOTE — Telephone Encounter (Signed)
Refill done.  

## 2012-04-11 ENCOUNTER — Encounter: Payer: 59 | Admitting: Internal Medicine

## 2012-04-18 ENCOUNTER — Other Ambulatory Visit: Payer: Self-pay | Admitting: Internal Medicine

## 2012-05-09 ENCOUNTER — Encounter: Payer: 59 | Admitting: Internal Medicine

## 2012-05-16 ENCOUNTER — Other Ambulatory Visit: Payer: Self-pay | Admitting: Internal Medicine

## 2012-05-21 ENCOUNTER — Other Ambulatory Visit: Payer: Self-pay | Admitting: Internal Medicine

## 2012-05-21 NOTE — Telephone Encounter (Signed)
Refill done.  

## 2012-06-12 ENCOUNTER — Other Ambulatory Visit: Payer: Self-pay | Admitting: Internal Medicine

## 2012-07-03 ENCOUNTER — Encounter: Payer: Self-pay | Admitting: Gastroenterology

## 2012-07-07 ENCOUNTER — Encounter: Payer: Self-pay | Admitting: Internal Medicine

## 2012-07-07 ENCOUNTER — Ambulatory Visit (INDEPENDENT_AMBULATORY_CARE_PROVIDER_SITE_OTHER): Payer: Managed Care, Other (non HMO) | Admitting: Internal Medicine

## 2012-07-07 VITALS — BP 136/90 | HR 78 | Temp 98.3°F | Resp 12 | Ht 72.03 in | Wt 237.2 lb

## 2012-07-07 DIAGNOSIS — K649 Unspecified hemorrhoids: Secondary | ICD-10-CM

## 2012-07-07 DIAGNOSIS — D696 Thrombocytopenia, unspecified: Secondary | ICD-10-CM | POA: Insufficient documentation

## 2012-07-07 DIAGNOSIS — G8929 Other chronic pain: Secondary | ICD-10-CM

## 2012-07-07 DIAGNOSIS — M545 Low back pain: Secondary | ICD-10-CM

## 2012-07-07 DIAGNOSIS — E785 Hyperlipidemia, unspecified: Secondary | ICD-10-CM

## 2012-07-07 DIAGNOSIS — Z0000 Encounter for general adult medical examination without abnormal findings: Secondary | ICD-10-CM

## 2012-07-07 DIAGNOSIS — I10 Essential (primary) hypertension: Secondary | ICD-10-CM

## 2012-07-07 LAB — CBC WITH DIFFERENTIAL/PLATELET
Eosinophils Absolute: 0.1 10*3/uL (ref 0.0–0.7)
HCT: 44.5 % (ref 39.0–52.0)
Lymphs Abs: 1.5 10*3/uL (ref 0.7–4.0)
MCHC: 34.5 g/dL (ref 30.0–36.0)
MCV: 92.1 fl (ref 78.0–100.0)
Monocytes Absolute: 0.4 10*3/uL (ref 0.1–1.0)
Neutrophils Relative %: 66.1 % (ref 43.0–77.0)
Platelets: 144 10*3/uL — ABNORMAL LOW (ref 150.0–400.0)

## 2012-07-07 LAB — BASIC METABOLIC PANEL
BUN: 21 mg/dL (ref 6–23)
CO2: 26 mEq/L (ref 19–32)
Chloride: 106 mEq/L (ref 96–112)
Creatinine, Ser: 1.1 mg/dL (ref 0.4–1.5)
Glucose, Bld: 96 mg/dL (ref 70–99)
Potassium: 4 mEq/L (ref 3.5–5.1)

## 2012-07-07 LAB — HEPATIC FUNCTION PANEL
Bilirubin, Direct: 0.2 mg/dL (ref 0.0–0.3)
Total Bilirubin: 1.9 mg/dL — ABNORMAL HIGH (ref 0.3–1.2)
Total Protein: 7.2 g/dL (ref 6.0–8.3)

## 2012-07-07 LAB — LIPID PANEL
Cholesterol: 169 mg/dL (ref 0–200)
HDL: 30.7 mg/dL — ABNORMAL LOW (ref >39.00)
Triglycerides: 206 mg/dL — ABNORMAL HIGH (ref 0.0–149.0)

## 2012-07-07 LAB — TSH: TSH: 1.43 u[IU]/mL (ref 0.35–5.50)

## 2012-07-07 MED ORDER — HYDROCORTISONE ACE-PRAMOXINE 1-1 % RE FOAM: 1.0000 | Freq: Two times a day (BID) | RECTAL | Status: DC

## 2012-07-07 NOTE — Patient Instructions (Addendum)
Preventive Health Care: Eat a low-fat diet with lots of fruits and vegetables, up to 7-9 servings per day.  Consume less than 40 grams of sugar per day from foods & drinks with High Fructose Corn Sugar as # 1,2,3 or # 4 on label. Health Care Power of Attorney & Living Will. Complete if not in place ; these place you in charge of your health care decisions. Blood Pressure Goal  Ideally is an AVERAGE < 135/85. This AVERAGE should be calculated from @ least 5-7 BP readings taken @ different times of day on different days of week. You should not respond to isolated BP readings , but rather the AVERAGE for that week . Soak your buttocks in Sitz bath twice a day and then apply the cream to the hemorrhoidal tissue.  If you activate My Chart; the results can be released to you as soon as they populate from the lab. If you choose not to use this program; the labs have to be reviewed, copied & mailed   causing a delay in getting the results to you.

## 2012-07-07 NOTE — Progress Notes (Signed)
Subjective:    Patient ID: Richard Trevino, male    DOB: 1951/06/05, 61 y.o.   MRN: [id number]  HPI  Richard Trevino  is here for a physical;acute issues  Include chronic low back pain responsive to NSAIDS.  He denies associated weight loss; numbness/tingling of extremities; weakness in extremities; or incontinence of urine or stool.      Review of Systems HYPERTENSION: Disease Monitoring: Blood pressure - 120/76 on average  Chest pain, palpitations-no       Dyspnea- no Medications: Compliance- yes  Lightheadedness,Syncope- no   Edema- unless driving long periods   FASTING HYPERGLYCEMIA, PMH of:  Polyuria/phagia/dipsia- no      Visual problems- no   HYPERLIPIDEMIA: Disease Monitoring: See symptoms for Hypertension Medications: Compliance- yes Abd pain, bowel changes- no  Muscle aches- no         Objective:   Physical Exam Gen.:  well-nourished in appearance. Alert, appropriate and cooperative throughout exam. Head: Normocephalic without obvious abnormalities; pattern alopecia ; moustache & beard Eyes: No corneal or conjunctival inflammation noted. Pupils equal round reactive to light and accommodation.  Extraocular motion intact. Vision grossly normal with lenses. Ears: External  ear exam reveals no significant lesions or deformities. Canals clear .TMs normal. Hearing is grossly normal bilaterally. Nose: External nasal exam reveals no deformity or inflammation. Nasal mucosa are pink and moist. No lesions or exudates noted.   Mouth: Oral mucosa and oropharynx reveal no lesions or exudates. Teeth in good repair. Neck: No deformities, masses, or tenderness noted. Range of motion & Thyroid normal. Lungs: Normal respiratory effort; chest expands symmetrically. Lungs are clear to auscultation without rales, wheezes, or increased work of breathing. Heart: Normal rate and rhythm. Normal S1 and S2. No gallop, click, or rub. No murmur. Abdomen: Bowel sounds normal; abdomen soft  and nontender. No masses, organomegaly or hernias noted. Genitalia/ DRE: Genitalia normal except for right granuloma & left varices. Prostate is normal without enlargement, asymmetry, nodularity, or induration. External hemorrhoid                                                  Musculoskeletal/extremities: No deformity or scoliosis noted of  the thoracic or lumbar spine. No clubbing, cyanosis, edema, or deformity noted. Range of motion  normal .Tone & strength  normal.Joints normal. Nail health  good. Negative straight leg raising bilaterally Vascular: Carotid, radial artery, dorsalis pedis and  posterior tibial pulses are full and equal. No bruits present. Neurologic: Alert and oriented x3. Deep tendon reflexes symmetrical and normal.          Skin: Intact without suspicious lesions or rashes. Lymph: No cervical, axillary, or inguinal lymphadenopathy present. Psych: Mood and affect are normal. Normally interactive                                                                                       Assessment & Plan:  #1 comprehensive physical exam; no acute findings #2 low back syndrome with no neurologic deficit #3 hemorrhoid #4 see  Problem List with Assessments & Recommendations Plan: see Orders

## 2012-07-17 ENCOUNTER — Telehealth: Payer: Self-pay | Admitting: *Deleted

## 2012-07-17 NOTE — Telephone Encounter (Signed)
Noted incoming fax for pt prior authorization for dexilant 60mg  #30, was advised by rep nancy that she will fax out the PA form and pt will need to verify if he has tried any other medication such as Nexium, Omeprazole any other OTC medication, called pt and was advised that the pt has tried all other medications with no resolve, advised that we are pending the forms and will advise on form the pt inability to take other medicaitons, fax in and send the dexilant when approved, pt understood will address when received

## 2012-07-18 NOTE — Telephone Encounter (Signed)
Noted incoming fax to be filled out and approved, completed form faxed back to pharmacy, pending approval

## 2012-07-20 ENCOUNTER — Other Ambulatory Visit: Payer: Self-pay | Admitting: Internal Medicine

## 2012-07-23 NOTE — Telephone Encounter (Signed)
Form completed by MD Alwyn Ren, signed and faxed, will send when approval noted,

## 2012-07-24 ENCOUNTER — Other Ambulatory Visit: Payer: Self-pay | Admitting: Internal Medicine

## 2012-07-29 ENCOUNTER — Encounter: Payer: Self-pay | Admitting: Internal Medicine

## 2012-07-29 NOTE — Telephone Encounter (Signed)
No approval letter noted in chart at this time, contacted MD Hopper assistant CB to advise approval pending

## 2012-07-29 NOTE — Telephone Encounter (Signed)
Was advised the pt medication has been approved and faxed to [facility] pharmacy, approval letter noted in media

## 2012-08-18 ENCOUNTER — Other Ambulatory Visit: Payer: Self-pay | Admitting: Internal Medicine

## 2012-08-19 NOTE — Telephone Encounter (Signed)
Rx sent.    MW 

## 2012-08-21 ENCOUNTER — Other Ambulatory Visit: Payer: Self-pay | Admitting: Internal Medicine

## 2012-09-17 ENCOUNTER — Other Ambulatory Visit: Payer: Self-pay | Admitting: Internal Medicine

## 2012-09-22 ENCOUNTER — Other Ambulatory Visit: Payer: Self-pay | Admitting: Internal Medicine

## 2012-10-17 ENCOUNTER — Other Ambulatory Visit: Payer: Self-pay | Admitting: Internal Medicine

## 2012-11-15 ENCOUNTER — Other Ambulatory Visit: Payer: Self-pay

## 2012-11-16 ENCOUNTER — Other Ambulatory Visit: Payer: Self-pay | Admitting: Internal Medicine

## 2013-01-21 ENCOUNTER — Encounter: Payer: Self-pay | Admitting: Gastroenterology

## 2013-02-02 ENCOUNTER — Other Ambulatory Visit: Payer: Self-pay | Admitting: General Practice

## 2013-02-02 MED ORDER — HYDROCHLOROTHIAZIDE 25 MG PO TABS: ORAL_TABLET | ORAL | Status: DC

## 2013-02-02 MED ORDER — DEXLANSOPRAZOLE 60 MG PO CPDR: DELAYED_RELEASE_CAPSULE | ORAL | Status: DC

## 2013-02-02 MED ORDER — TAMSULOSIN HCL 0.4 MG PO CAPS: ORAL_CAPSULE | ORAL | Status: DC

## 2013-02-02 NOTE — Telephone Encounter (Signed)
Refill request for Dexilant, Tamsulosin, and HCTZ. Pt last seen on 07/2012. Left message for pt to schedule an appt. meds filled for 30 with 0 refills.

## 2013-02-06 MED ORDER — TAMSULOSIN HCL 0.4 MG PO CAPS: ORAL_CAPSULE | ORAL | Status: DC

## 2013-02-06 MED ORDER — HYDROCHLOROTHIAZIDE 25 MG PO TABS: ORAL_TABLET | ORAL | Status: DC

## 2013-02-06 MED ORDER — DEXLANSOPRAZOLE 60 MG PO CPDR: DELAYED_RELEASE_CAPSULE | ORAL | Status: DC

## 2013-02-06 NOTE — Telephone Encounter (Signed)
[facility]

## 2013-02-06 NOTE — Addendum Note (Signed)
Addended by: Jackson Latino on: 02/06/2013 05:03 PM   Modules accepted: Orders

## 2013-02-09 ENCOUNTER — Telehealth: Payer: Self-pay | Admitting: General Practice

## 2013-02-09 NOTE — Telephone Encounter (Signed)
[facility] a [facility] from [facility] regarding pt's dexlansoprazole (DEXILANT) 60 MG capsule. They state it would be cheaper to switch pt to Lansoprazole, omeprazole, or pantoprazole. Called pt and he informed that he does not want to switch from the Dexilant as it is the only medication that has helped him.

## 2013-02-16 ENCOUNTER — Encounter: Payer: Self-pay | Admitting: Internal Medicine

## 2013-02-16 ENCOUNTER — Ambulatory Visit (INDEPENDENT_AMBULATORY_CARE_PROVIDER_SITE_OTHER): Payer: Managed Care, Other (non HMO) | Admitting: Internal Medicine

## 2013-02-16 VITALS — BP 134/82 | HR 72 | Temp 98.1°F | Wt 234.0 lb

## 2013-02-16 DIAGNOSIS — I10 Essential (primary) hypertension: Secondary | ICD-10-CM

## 2013-02-16 DIAGNOSIS — K219 Gastro-esophageal reflux disease without esophagitis: Secondary | ICD-10-CM

## 2013-02-16 DIAGNOSIS — D696 Thrombocytopenia, unspecified: Secondary | ICD-10-CM

## 2013-02-16 MED ORDER — DEXLANSOPRAZOLE 60 MG PO CPDR: DELAYED_RELEASE_CAPSULE | ORAL | Status: DC

## 2013-02-16 NOTE — Assessment & Plan Note (Signed)
Blood pressure control is excellent; no change indicated

## 2013-02-16 NOTE — Assessment & Plan Note (Signed)
DEXILANT is controlling symptoms; it is clinically indicated especially in view of the mild thrombocytopenia as noted.

## 2013-02-16 NOTE — Patient Instructions (Addendum)
Reflux of gastric acid may be asymptomatic as this may occur mainly during sleep.The triggers for reflux  include stress; the "aspirin family" ; alcohol; peppermint; and caffeine (coffee, tea, cola, and chocolate). The aspirin family would include aspirin and the nonsteroidal agents such as ibuprofen &  Naproxen. Tylenol would not cause reflux. If having symptoms ; food & drink should be avoided for @ least 2 hours before going to bed.   The most common cause of elevated triglycerides (TG) is the ingestion of sugar from high fructose corn syrup sources added to processed foods & drinks.  Eat a low-fat diet with lots of fruits and vegetables, up to 7-9 servings per day. Consume less than 40  Grams (preferably ZERO) of sugar per day from foods & drinks with High Fructose Corn Syrup (HFCS) sugar as #1,2,3 or # 4 on label.Whole Foods, [employer] & [employer] do not carry products with HFCS. 

## 2013-02-16 NOTE — Progress Notes (Signed)
  Subjective:    Patient ID: Richard Trevino, male    DOB: May 10, 1951, 62 y.o.   MRN: [id number]  HPI   He is taking DEXILANT for reflux with good control. Unless he rushes his meals, and he has no dysphagia. There is no associated unexplained weight loss, melena, or rectal bleeding.  He is delaying his followup colonoscopy for insurance coverage reasons until 2014.    Review of Systems He is on a heart healthy diet; he is not exercising but is very physically active on his job without symptoms. Specifically he denies chest pain, palpitations, dyspnea, or claudication. Family history is negative for premature coronary disease. Advanced cholesterol testing reveals his LDL goal was less than 100.He continues on statin; his LDL has been at goal. 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   He has a history of slightly low platelets; he switched from a full dose to a baby aspirin daily     Objective:   Physical Exam  General appearance is one of good health and nourishment w/o distress.  Eyes: No conjunctival inflammation or scleral icterus is present.  Oral exam: Dental hygiene is good; lips and gums are healthy appearing.There is no oropharyngeal erythema or exudate noted.   Heart:  Normal rate and regular rhythm. S1 and S2 normal without gallop, murmur, click, rub or other extra sounds     Lungs:Chest clear to auscultation; no wheezes, rhonchi,rales ,or rubs present.No increased work of breathing.   Abdomen: bowel sounds normal, soft and non-tender without masses, organomegaly or hernias noted.  No guarding or rebound   Skin:Warm & dry.  Intact without suspicious lesions or rashes ; no jaundice   Lymphatic: No lymphadenopathy is noted about the head, neck, axilla.             Assessment & Plan:  See Current Assessment & Plan in Problem List under specific Diagnosis

## 2013-02-16 NOTE — Assessment & Plan Note (Signed)
   We discussed potential risk of excess aspirin or nonsteroidal agents

## 2013-03-16 ENCOUNTER — Encounter: Payer: Self-pay | Admitting: Cardiology

## 2013-03-29 ENCOUNTER — Other Ambulatory Visit: Payer: Self-pay | Admitting: Internal Medicine

## 2013-03-30 NOTE — Telephone Encounter (Signed)
Refill done.  

## 2013-05-15 ENCOUNTER — Other Ambulatory Visit: Payer: Self-pay | Admitting: *Deleted

## 2013-05-15 MED ORDER — TAMSULOSIN HCL 0.4 MG PO CAPS: ORAL_CAPSULE | ORAL | Status: DC

## 2013-05-15 NOTE — Telephone Encounter (Signed)
Rx refilled for 30 day supply only patients labs are due.  Ag cma

## 2013-07-09 ENCOUNTER — Other Ambulatory Visit: Payer: Self-pay | Admitting: Internal Medicine

## 2013-07-10 ENCOUNTER — Other Ambulatory Visit: Payer: Self-pay | Admitting: *Deleted

## 2013-07-10 MED ORDER — TAMSULOSIN HCL 0.4 MG PO CAPS: ORAL_CAPSULE | ORAL | Status: DC

## 2013-07-10 NOTE — Telephone Encounter (Signed)
Flomax refill sent to pharmacy 

## 2013-07-28 ENCOUNTER — Other Ambulatory Visit: Payer: Self-pay | Admitting: Internal Medicine

## 2013-07-28 NOTE — Telephone Encounter (Signed)
Prozac refill sent to pharmacy

## 2013-07-29 ENCOUNTER — Other Ambulatory Visit: Payer: Self-pay | Admitting: Internal Medicine

## 2013-07-29 NOTE — Telephone Encounter (Signed)
Hydrochlorothiazide refill sent to pharmacy 

## 2013-08-03 ENCOUNTER — Other Ambulatory Visit (INDEPENDENT_AMBULATORY_CARE_PROVIDER_SITE_OTHER): Payer: Managed Care, Other (non HMO)

## 2013-08-03 DIAGNOSIS — Z0000 Encounter for general adult medical examination without abnormal findings: Secondary | ICD-10-CM

## 2013-08-03 LAB — CBC WITH DIFFERENTIAL/PLATELET
Basophils Absolute: 0 10*3/uL (ref 0.0–0.1)
Eosinophils Absolute: 0.1 10*3/uL (ref 0.0–0.7)
HCT: 42.4 % (ref 39.0–52.0)
Lymphs Abs: 1.6 10*3/uL (ref 0.7–4.0)
MCHC: 34.9 g/dL (ref 30.0–36.0)
MCV: 90.5 fl (ref 78.0–100.0)
Monocytes Absolute: 0.4 10*3/uL (ref 0.1–1.0)
Neutrophils Relative %: 65.6 % (ref 43.0–77.0)
Platelets: 131 10*3/uL — ABNORMAL LOW (ref 150.0–400.0)
RDW: 12.5 % (ref 11.5–14.6)

## 2013-08-03 LAB — HEPATIC FUNCTION PANEL
ALT: 20 U/L (ref 0–53)
Albumin: 3.9 g/dL (ref 3.5–5.2)
Bilirubin, Direct: 0.1 mg/dL (ref 0.0–0.3)
Total Protein: 6.5 g/dL (ref 6.0–8.3)

## 2013-08-03 LAB — BASIC METABOLIC PANEL
CO2: 27 mEq/L (ref 19–32)
Calcium: 9.3 mg/dL (ref 8.4–10.5)
GFR: 77.13 mL/min (ref >60.00)
Potassium: 3.7 mEq/L (ref 3.5–5.1)
Sodium: 143 mEq/L (ref 135–145)

## 2013-08-03 LAB — LIPID PANEL
Cholesterol: 156 mg/dL (ref 0–200)
HDL: 29.9 mg/dL — ABNORMAL LOW (ref >39.00)
Triglycerides: 260 mg/dL — ABNORMAL HIGH (ref 0.0–149.0)
VLDL: 52 mg/dL — ABNORMAL HIGH (ref 0.0–40.0)

## 2013-08-03 LAB — TSH: TSH: 1.53 u[IU]/mL (ref 0.35–5.50)

## 2013-08-03 LAB — LDL CHOLESTEROL, DIRECT: Direct LDL: 89.2 mg/dL

## 2013-08-06 ENCOUNTER — Other Ambulatory Visit: Payer: Self-pay

## 2013-08-10 ENCOUNTER — Ambulatory Visit (INDEPENDENT_AMBULATORY_CARE_PROVIDER_SITE_OTHER): Payer: Managed Care, Other (non HMO) | Admitting: Internal Medicine

## 2013-08-10 ENCOUNTER — Encounter: Payer: Self-pay | Admitting: Internal Medicine

## 2013-08-10 ENCOUNTER — Other Ambulatory Visit: Payer: Self-pay | Admitting: Internal Medicine

## 2013-08-10 ENCOUNTER — Encounter: Payer: Self-pay | Admitting: Gastroenterology

## 2013-08-10 VITALS — BP 118/81 | HR 81 | Temp 98.5°F | Wt 234.6 lb

## 2013-08-10 DIAGNOSIS — K219 Gastro-esophageal reflux disease without esophagitis: Secondary | ICD-10-CM

## 2013-08-10 DIAGNOSIS — Z8042 Family history of malignant neoplasm of prostate: Secondary | ICD-10-CM

## 2013-08-10 DIAGNOSIS — R972 Elevated prostate specific antigen [PSA]: Secondary | ICD-10-CM

## 2013-08-10 DIAGNOSIS — Z0000 Encounter for general adult medical examination without abnormal findings: Secondary | ICD-10-CM

## 2013-08-10 DIAGNOSIS — K625 Hemorrhage of anus and rectum: Secondary | ICD-10-CM

## 2013-08-10 DIAGNOSIS — E785 Hyperlipidemia, unspecified: Secondary | ICD-10-CM

## 2013-08-10 MED ORDER — TAMSULOSIN HCL 0.4 MG PO CAPS: 0.4000 mg | ORAL_CAPSULE | Freq: Every day | ORAL | Status: DC

## 2013-08-10 MED ORDER — PRAVASTATIN SODIUM 20 MG PO TABS: ORAL_TABLET | ORAL | Status: DC

## 2013-08-10 MED ORDER — LOSARTAN POTASSIUM 100 MG PO TABS: 100.0000 mg | ORAL_TABLET | Freq: Two times a day (BID) | ORAL | Status: DC

## 2013-08-10 MED ORDER — HYDROCHLOROTHIAZIDE 25 MG PO TABS: ORAL_TABLET | ORAL | Status: DC

## 2013-08-10 MED ORDER — FLUOXETINE HCL 20 MG PO CAPS: 20.0000 mg | ORAL_CAPSULE | Freq: Every day | ORAL | Status: DC

## 2013-08-10 MED ORDER — DEXLANSOPRAZOLE 60 MG PO CPDR: DELAYED_RELEASE_CAPSULE | ORAL | Status: DC

## 2013-08-10 NOTE — Progress Notes (Signed)
Pre visit review using our clinic review tool, if applicable. No additional management support is needed unless otherwise documented below in the visit note. 

## 2013-08-10 NOTE — Patient Instructions (Signed)
As per the Standard of Care , screening Colonoscopy recommended @ 50 & every 5-10 years thereafter . More frequent monitor would be dictated by family history or findings @ Colonoscopy.  Surveillance colonoscopy cannot be scheduled because of the recent history of rectal bleeding. Dr. Russella Dar will need to reassess you.

## 2013-08-10 NOTE — Progress Notes (Signed)
  Subjective:    Patient ID: Richard Trevino, male    DOB: [birth year], 62 y.o.   MRN: [id number]  HPI  He is here for a physical;acute issues denied.     Review of Systems  A modified  heart healthy diet is followed; no regular exercise  Family history is positive for premature CVA in his father. Advanced cholesterol testing reveals  LDL goal is less than 100 ; ideally < 75 . There is medication compliance with the statin.  Low dose ASA taken Specifically denied are  chest pain, palpitations, dyspnea, or claudication.  Significant abdominal symptoms, memory deficit, or myalgias not present now but rectal bleeding over several weeks 2-3 months ago. BP not monitored @ home.      Objective:   Physical Exam  Gen.: Healthy and well-nourished in appearance. Alert, appropriate and cooperative throughout exam. Head: Normocephalic without obvious abnormalities;beard & moustache  Eyes: No corneal or conjunctival inflammation noted. Pupils equal round reactive to light and accommodation. Extraocular motion intact. Ears: External  ear exam reveals no significant lesions or deformities. Canals clear .TMs normal. Hearing is grossly normal bilaterally. Nose: External nasal exam reveals no deformity or inflammation. Nasal mucosa are pink and moist. No lesions or exudates noted.   Mouth: Oral mucosa and oropharynx reveal no lesions or exudates. Teeth in good repair. Neck: No deformities, masses, or tenderness noted. Range of motion & Thyroid normal. Lungs: Normal respiratory effort; chest expands symmetrically. Lungs are clear to auscultation without rales, wheezes, or increased work of breathing. Heart: Normal rate and rhythm. Accentuated S1 ; normal S2. No gallop, click, or rub. No murmur. Abdomen: Bowel sounds normal; abdomen soft and nontender. No masses, organomegaly or hernias noted. Genitalia: Genitalia normal except for bilateral epididymal granuloma & left varices. Prostate is normal without  enlargement,  nodularity, or induration . Minimal asymmetry, R > L. FOB negative                                   Musculoskeletal/extremities: No deformity or scoliosis noted of  the thoracic or lumbar spine.    No clubbing, cyanosis, edema, or significant extremity  deformity noted. Range of motion normal .Tone & strength normal. Hand joints  reveal minor L 5th DIP flexion changes. Fingernail health good. Able to lie down & sit up w/o help. Negative SLR bilaterally Vascular: Carotid, radial artery, dorsalis pedis and  posterior tibial pulses are full and equal. No bruits present. Neurologic: Alert and oriented x3. Deep tendon reflexes symmetrical and normal.         Skin: Intact without suspicious lesions or rashes. Blanching erythema above OD Lymph: No cervical, axillary, or inguinal lymphadenopathy present. Psych: Mood and affect are normal. Normally interactive                                                                                        Assessment & Plan:  #1 comprehensive physical exam; no acute findings #2 rectal bleeding, resolved. No anemia  Plan: see Orders  & Recommendations

## 2013-08-13 ENCOUNTER — Telehealth: Payer: Self-pay | Admitting: *Deleted

## 2013-08-13 NOTE — Telephone Encounter (Signed)
Received approval for Losartan Potassium, effective 07/12/2013-08/11/2014. Case ID: [id number]

## 2013-09-12 ENCOUNTER — Other Ambulatory Visit: Payer: Self-pay | Admitting: Internal Medicine

## 2013-09-14 ENCOUNTER — Encounter: Payer: Self-pay | Admitting: Gastroenterology

## 2013-09-14 ENCOUNTER — Ambulatory Visit (INDEPENDENT_AMBULATORY_CARE_PROVIDER_SITE_OTHER): Payer: Managed Care, Other (non HMO) | Admitting: Gastroenterology

## 2013-09-14 VITALS — BP 110/70 | HR 89 | Ht 70.5 in | Wt 238.4 lb

## 2013-09-14 DIAGNOSIS — K219 Gastro-esophageal reflux disease without esophagitis: Secondary | ICD-10-CM

## 2013-09-14 DIAGNOSIS — K921 Melena: Secondary | ICD-10-CM

## 2013-09-14 MED ORDER — PEG-KCL-NACL-NASULF-NA ASC-C 100 G PO SOLR: 1.0000 | Freq: Once | ORAL | Status: DC

## 2013-09-14 NOTE — Patient Instructions (Addendum)
You have been scheduled for a colonoscopy with propofol. Please follow written instructions given to you at your visit today.  Please pick up your prep kit at the pharmacy within the next 1-3 days. If you use inhalers (even only as needed), please bring them with you on the day of your procedure.  Thank you for choosing me and [facility].  Malcolm T. Stark, Jr., MD., FACG    

## 2013-09-14 NOTE — Progress Notes (Signed)
    History of Present Illness: This is a 62 year old male who previously underwent colonoscopy in October 2003 showing only small internal hemorrhoids. He relates problems with small volume hematochezia with bowel movements occurring once or twice a month the past 9 months. He has had a couple episodes of clots per rectum. He has chronic mild thrombocytopenia and Gilbert's syndrome. Denies weight loss, abdominal pain, constipation, diarrhea, change in stool caliber, melena, nausea, vomiting, dysphagia, reflux symptoms, chest pain.  Review of Systems: Pertinent positive and negative review of systems were noted in the above HPI section. All other review of systems were otherwise negative.  Current Medications, Allergies, Past Medical History, Past Surgical History, Family History and Social History were reviewed in [employer] record.  Physical Exam: General: Well developed , well nourished, no acute distress Head: Normocephalic and atraumatic Eyes:  sclerae anicteric, EOMI Ears: Normal auditory acuity Mouth: No deformity or lesions Neck: Supple, no masses or thyromegaly Lungs: Clear throughout to auscultation Heart: Regular rate and rhythm; no murmurs, rubs or bruits Abdomen: Soft, non tender and non distended. No masses, hepatosplenomegaly or hernias noted. Normal Bowel sounds Rectal: Deferred to colonoscopy. Recent exam by Dr. Alwyn Ren unremarkable with Hemoccult negative stool Musculoskeletal: Symmetrical with no gross deformities  Skin: No lesions on visible extremities Pulses:  Normal pulses noted Extremities: No clubbing, cyanosis, edema or deformities noted Neurological: Alert oriented x 4, grossly nonfocal Cervical Nodes:  No significant cervical adenopathy Inguinal Nodes: No significant inguinal adenopathy Psychological:  Alert and cooperative. Normal mood and affect  Assessment and Recommendations:  1. Hematochezia. Rule out hemorrhoids, colorectal  neoplasms and other disorders. The risks, benefits, and alternatives to colonoscopy with possible biopsy and possible polypectomy were discussed with the patient and they consent to proceed.   2. GERD. Symptoms controlled on dexlansoprazole and antireflux measures.   3. Mild chronic thrombocytopenia. Last platelet count was 131k.  4. Gilbert's syndrome

## 2013-09-14 NOTE — Telephone Encounter (Signed)
Rx sent to the pharmacy by e-script.//AB/CMA 

## 2013-09-26 ENCOUNTER — Other Ambulatory Visit: Payer: Self-pay | Admitting: Internal Medicine

## 2013-09-28 NOTE — Telephone Encounter (Signed)
Pravastatin refilled per protocol. JG//CMA 

## 2013-09-30 ENCOUNTER — Encounter: Payer: Self-pay | Admitting: Internal Medicine

## 2013-10-23 NOTE — Telephone Encounter (Signed)
LM @ (11:43am) asking the pt to RTC regarding meds below.//AB/CMA

## 2013-11-06 ENCOUNTER — Encounter: Payer: Managed Care, Other (non HMO) | Admitting: Gastroenterology

## 2013-11-29 ENCOUNTER — Encounter: Payer: Self-pay | Admitting: Gastroenterology

## 2013-11-30 ENCOUNTER — Telehealth: Payer: Self-pay | Admitting: Gastroenterology

## 2013-11-30 ENCOUNTER — Encounter: Payer: Self-pay | Admitting: Gastroenterology

## 2013-12-01 ENCOUNTER — Encounter: Payer: Self-pay | Admitting: Gastroenterology

## 2013-12-01 ENCOUNTER — Ambulatory Visit (AMBULATORY_SURGERY_CENTER): Payer: Managed Care, Other (non HMO) | Admitting: Gastroenterology

## 2013-12-01 VITALS — BP 132/80 | HR 66 | Temp 98.3°F | Resp 50 | Ht 70.0 in | Wt 238.0 lb

## 2013-12-01 DIAGNOSIS — D126 Benign neoplasm of colon, unspecified: Secondary | ICD-10-CM

## 2013-12-01 DIAGNOSIS — K921 Melena: Secondary | ICD-10-CM

## 2013-12-01 MED ORDER — SODIUM CHLORIDE 0.9 % IV SOLN: 500.0000 mL | INTRAVENOUS | Status: DC

## 2013-12-01 NOTE — Op Note (Signed)
[facility]  [address] [place], [address]   COLONOSCOPY PROCEDURE REPORT  PATIENT: Richard Trevino, Richard Trevino.  MR#: [id number] BIRTHDATE: 05-08-1951 , 62  yrs. old GENDER: Male ENDOSCOPIST: Ladene Artist, MD, [facility] PROCEDURE DATE:  12/01/2013 PROCEDURE:   Colonoscopy with snare polypectomy First Screening Colonoscopy - Avg.  risk and is 50 yrs.  old or older - No.  Prior Negative Screening - Now for repeat screening. N/A  History of Adenoma - Now for follow-up colonoscopy & has been > or = to 3 yrs.  N/A  Polyps Removed Today? Yes. ASA CLASS:   Class II INDICATIONS:hematochezia. MEDICATIONS: MAC sedation, administered by CRNA and propofol (Diprivan) 270mg  IV DESCRIPTION OF PROCEDURE:   After the risks benefits and alternatives of the procedure were thoroughly explained, informed consent was obtained.  A digital rectal exam revealed no abnormalities of the rectum.   The LB [id number]  endoscope was introduced through the anus and advanced to the cecum, which was identified by both the appendix and ileocecal valve. No adverse events experienced.   The quality of the prep was excellent, using MoviPrep  The instrument was then slowly withdrawn as the colon was fully examined.  COLON FINDINGS: Two sessile polyps measuring 5-6 mm in size were found in the transverse colon.  A polypectomy was performed with a cold snare.  The resection was complete and the polyp tissue was completely retrieved.   The colon was otherwise normal.  There was no diverticulosis, inflammation, polyps or cancers unless previously stated.  Retroflexed views revealed moderate internal hemorrhoids. The time to cecum=2 minutes 09 seconds.  Withdrawal time=9 minutes 16 seconds.  The scope was withdrawn and the procedure completed. COMPLICATIONS: There were no complications.  ENDOSCOPIC IMPRESSION: 1.   Two sessile polyps measuring 5-6 mm in the transverse colon; polypectomy performed with  a cold snare 2.   Moderate internal hemorrhoids  RECOMMENDATIONS: 1.  Await pathology results 2.  Repeat colonoscopy in 5 years if polyp(s) adenomatous; otherwise 10 years 3.  Prep H supp bid prn hemorrhoidal symptoms  eSigned:  Ladene Artist, MD, [facility] 12/01/2013 9:35 AM

## 2013-12-01 NOTE — Progress Notes (Signed)
Called to room to assist during endoscopic procedure.  Patient ID and intended procedure confirmed with present staff. Received instructions for my participation in the procedure from the performing physician.  

## 2013-12-01 NOTE — Patient Instructions (Signed)
Discharge instructions given with verbal understanding. Handouts on polyps and hemorrhoids. Resume previous medications. YOU HAD AN ENDOSCOPIC PROCEDURE TODAY AT THE [facility]: Refer to the procedure report that was given to you for any specific questions about what was found during the examination.  If the procedure report does not answer your questions, please call your gastroenterologist to clarify.  If you requested that your care partner not be given the details of your procedure findings, then the procedure report has been included in a sealed envelope for you to review at your convenience later.  YOU SHOULD EXPECT: Some feelings of bloating in the abdomen. Passage of more gas than usual.  Walking can help get rid of the air that was put into your GI tract during the procedure and reduce the bloating. If you had a lower endoscopy (such as a colonoscopy or flexible sigmoidoscopy) you may notice spotting of blood in your stool or on the toilet paper. If you underwent a bowel prep for your procedure, then you may not have a normal bowel movement for a few days.  DIET: Your first meal following the procedure should be a light meal and then it is ok to progress to your normal diet.  A half-sandwich or bowl of soup is an example of a good first meal.  Heavy or fried foods are harder to digest and may make you feel nauseous or bloated.  Likewise meals heavy in dairy and vegetables can cause extra gas to form and this can also increase the bloating.  Drink plenty of fluids but you should avoid alcoholic beverages for 24 hours.  ACTIVITY: Your care partner should take you home directly after the procedure.  You should plan to take it easy, moving slowly for the rest of the day.  You can resume normal activity the day after the procedure however you should NOT DRIVE or use heavy machinery for 24 hours (because of the sedation medicines used during the test).    SYMPTOMS TO REPORT  IMMEDIATELY: A gastroenterologist can be reached at any hour.  During normal business hours, 8:30 AM to 5:00 PM Monday through Friday, call [phone].  After hours and on weekends, please call the GI answering service at [phone] who will take a message and have the physician on call contact you.   Following lower endoscopy (colonoscopy or flexible sigmoidoscopy):  Excessive amounts of blood in the stool  Significant tenderness or worsening of abdominal pains  Swelling of the abdomen that is new, acute  Fever of 100F or higher  FOLLOW UP: If any biopsies were taken you will be contacted by phone or by letter within the next 1-3 weeks.  Call your gastroenterologist if you have not heard about the biopsies in 3 weeks.  Our staff will call the home number listed on your records the next business day following your procedure to check on you and address any questions or concerns that you may have at that time regarding the information given to you following your procedure. This is a courtesy call and so if there is no answer at the home number and we have not heard from you through the emergency physician on call, we will assume that you have returned to your regular daily activities without incident.  SIGNATURES/CONFIDENTIALITY: You and/or your care partner have signed paperwork which will be entered into your electronic medical record.  These signatures attest to the fact that that the information above on your After Visit Summary   has been reviewed and is understood.  Full responsibility of the confidentiality of this discharge information lies with you and/or your care-partner. 

## 2013-12-01 NOTE — Progress Notes (Signed)
Report to pacu rn, vss, bbs=clear 

## 2013-12-02 ENCOUNTER — Telehealth: Payer: Self-pay | Admitting: *Deleted

## 2013-12-02 NOTE — Telephone Encounter (Signed)
No answer, left message to call if questions or concerns. 

## 2013-12-07 ENCOUNTER — Encounter: Payer: Self-pay | Admitting: Gastroenterology

## 2014-01-26 NOTE — Telephone Encounter (Signed)
Pt called by admitting

## 2014-02-01 ENCOUNTER — Other Ambulatory Visit: Payer: Self-pay | Admitting: Internal Medicine

## 2014-02-01 NOTE — Telephone Encounter (Signed)
He was Rxed #90 with 3 refills in 11/14. Rx should be active until 11/15

## 2014-02-01 NOTE — Telephone Encounter (Signed)
Last ov with you on 08/10/2013 Med refilled on 08/10/2013 #90 plus 3 refills

## 2014-05-01 ENCOUNTER — Encounter: Payer: Self-pay | Admitting: Internal Medicine

## 2014-05-03 MED ORDER — LOSARTAN POTASSIUM 100 MG PO TABS: 100.0000 mg | ORAL_TABLET | Freq: Two times a day (BID) | ORAL | Status: DC

## 2014-06-20 ENCOUNTER — Other Ambulatory Visit: Payer: Self-pay | Admitting: Internal Medicine

## 2014-08-16 ENCOUNTER — Encounter: Payer: Self-pay | Admitting: Internal Medicine

## 2014-08-16 ENCOUNTER — Ambulatory Visit (INDEPENDENT_AMBULATORY_CARE_PROVIDER_SITE_OTHER): Payer: Managed Care, Other (non HMO) | Admitting: Internal Medicine

## 2014-08-16 VITALS — BP 104/70 | HR 77 | Temp 98.1°F | Resp 12 | Wt 236.5 lb

## 2014-08-16 DIAGNOSIS — R972 Elevated prostate specific antigen [PSA]: Secondary | ICD-10-CM

## 2014-08-16 DIAGNOSIS — K219 Gastro-esophageal reflux disease without esophagitis: Secondary | ICD-10-CM

## 2014-08-16 DIAGNOSIS — E785 Hyperlipidemia, unspecified: Secondary | ICD-10-CM

## 2014-08-16 DIAGNOSIS — I10 Essential (primary) hypertension: Secondary | ICD-10-CM

## 2014-08-16 DIAGNOSIS — R739 Hyperglycemia, unspecified: Secondary | ICD-10-CM

## 2014-08-16 DIAGNOSIS — R1310 Dysphagia, unspecified: Secondary | ICD-10-CM

## 2014-08-16 NOTE — Progress Notes (Signed)
Pre visit review using our clinic review tool, if applicable. No additional management support is needed unless otherwise documented below in the visit note. 

## 2014-08-16 NOTE — Assessment & Plan Note (Signed)
Liproprofile, LFTs, TSH

## 2014-08-16 NOTE — Assessment & Plan Note (Signed)
GI referral for dysphagia CBC & dif

## 2014-08-16 NOTE — Patient Instructions (Signed)

## 2014-08-16 NOTE — Assessment & Plan Note (Signed)
A1c

## 2014-08-16 NOTE — Progress Notes (Signed)
Subjective:    Patient ID: Richard Trevino, male    DOB: [birth year], 63 y.o.   MRN: [id number]  HPI  He is here to assess active health issues & conditions. PMH, FH, & Social history verified & updated    He was previously on Dexilant; but it was changed to Nexium because of lack of insurance coverage. Despite taking Nexium 40 mg daily; he continues to have dysphagia once weekly or every 2 weeks. He believes his last upper endoscopy was in 2005.  He also describes hoarseness.  He continues to have some occasional rectal bleeding. He had a colonoscopy this year. He states insurance would  not pay for it.  He has compliant with this antihypertensive as well as statin medications without adverse effects.  He has not monitored his blood pressure.  He does have occasional pedal edema after driving for prolonged period of time.  He is on fluoxetine which his wife feels helps his anxiety and irritability. Deppression is denied. She states that he "chills out better"on the medication.  He is on tamsulosin yet he continues to have some lower urinary tract symptoms including difficulty starting stream and nocturia several times a night. In  2012 his PSA was 0.54. It had been below 0.5 dating back to 2010. In November 2014 it had risen to 1.80. Hearing instructed to return in May to have a repeat PSA. This was not completed.   He takes Benadryl 25 mg 3 at bedtime for sleep. His wife denies excessive snoring or sleep apnea.     Review of Systems   He denies chest pain, palpitations, dyspnea, claudication, or paroxysmal nocturnal dyspnea  He has no abdominal pain, initially weight loss, melena, or small-caliber stools  He also denies significant memory deficit or myalgias       Objective:   Physical Exam  Gen.: Healthy and well-nourished in appearance. Alert, appropriate and cooperative throughout exam. Appears younger than stated age  Head: Normocephalic without obvious  abnormalities;  Beard & moustache. Pattern alopecia  Eyes: No corneal or conjunctival inflammation noted. Pupils equal round reactive to light and accommodation. Extraocular motion intact.  Ears: External  ear exam reveals no significant lesions or deformities. Canals clear .TMs normal. Hearing is grossly normal bilaterally. Nose: External nasal exam reveals no deformity or inflammation. Nasal mucosa are pink and moist. No lesions or exudates noted.   Mouth: Oral mucosa and oropharynx reveal no lesions or exudates. Teeth in good repair. Neck: No deformities, masses, or tenderness noted. Range of motion & Thyroid normal Lungs: Normal respiratory effort; chest expands symmetrically. Lungs are clear to auscultation without rales, wheezes, or increased work of breathing. Heart: Normal rate and rhythm. Normal S1 and S2. No gallop, click, or rub. No murmur. Abdomen: Protuberant.Bowel sounds normal; abdomen soft and nontender. No masses, organomegaly or hernias noted. Genitalia:  as per Urologist                                  Musculoskeletal/extremities: No deformity or scoliosis noted of  the thoracic or lumbar spine.  No clubbing, cyanosis, edema, or significant extremity  deformity noted. Range of motion normal .Tone & strength normal. Hand joints reveal mild  5th L finger flexion @ DIP.  Fingernail  health good. Able to lie down & sit up w/o help. Negative SLR bilaterally Vascular: Carotid, radial artery, dorsalis pedis and  posterior tibial pulses are full and  equal. No bruits present. Neurologic: Alert and oriented x3. Deep tendon reflexes symmetrical and normal.  Gait normal .      Skin: Intact without suspicious lesions or rashes. Lymph: No cervical, axillary lymphadenopathy present. Psych: Mood and affect are normal. Normally interactive                                                                                       Assessment & Plan:  See Current Assessment & Plan in Problem  List under specific Diagnosis

## 2014-08-16 NOTE — Assessment & Plan Note (Signed)
Blood pressure goals reviewed. BMET 

## 2014-08-17 ENCOUNTER — Telehealth: Payer: Self-pay

## 2014-08-17 ENCOUNTER — Telehealth: Payer: Self-pay | Admitting: Internal Medicine

## 2014-08-17 NOTE — Telephone Encounter (Signed)
-----   Message from Ladene Artist, MD sent at 08/17/2014  8:34 AM EST ----- We will follow up. Barbera Setters will you contact the patient. Likely needs an REV.   ----- Message -----    From: Hendricks Limes, MD    Sent: 08/17/2014   7:48 AM      To: Ladene Artist, MD  He is having ongoing dysphagia despite Nexium qd. He states insurance would not pay for colonoscopy. Can your staff F/U please. Thanks, [employer]

## 2014-08-17 NOTE — Telephone Encounter (Signed)
emmi emailed °

## 2014-08-17 NOTE — Telephone Encounter (Signed)
Left message for patient to call back  

## 2014-08-17 NOTE — Assessment & Plan Note (Signed)
PSA  reheck

## 2014-08-18 NOTE — Telephone Encounter (Signed)
Patient is scheduled for 08/23/14 with Alonza Bogus, PA

## 2014-08-23 ENCOUNTER — Other Ambulatory Visit (INDEPENDENT_AMBULATORY_CARE_PROVIDER_SITE_OTHER): Payer: Managed Care, Other (non HMO)

## 2014-08-23 ENCOUNTER — Encounter: Payer: Self-pay | Admitting: Gastroenterology

## 2014-08-23 ENCOUNTER — Other Ambulatory Visit: Payer: Self-pay | Admitting: Internal Medicine

## 2014-08-23 ENCOUNTER — Ambulatory Visit (INDEPENDENT_AMBULATORY_CARE_PROVIDER_SITE_OTHER): Payer: Managed Care, Other (non HMO) | Admitting: Gastroenterology

## 2014-08-23 VITALS — BP 120/70 | HR 79 | Ht 70.0 in | Wt 236.6 lb

## 2014-08-23 DIAGNOSIS — E785 Hyperlipidemia, unspecified: Secondary | ICD-10-CM

## 2014-08-23 DIAGNOSIS — K219 Gastro-esophageal reflux disease without esophagitis: Secondary | ICD-10-CM | POA: Insufficient documentation

## 2014-08-23 DIAGNOSIS — R972 Elevated prostate specific antigen [PSA]: Secondary | ICD-10-CM

## 2014-08-23 DIAGNOSIS — R739 Hyperglycemia, unspecified: Secondary | ICD-10-CM

## 2014-08-23 DIAGNOSIS — I10 Essential (primary) hypertension: Secondary | ICD-10-CM

## 2014-08-23 DIAGNOSIS — R1310 Dysphagia, unspecified: Secondary | ICD-10-CM | POA: Insufficient documentation

## 2014-08-23 LAB — CBC WITH DIFFERENTIAL/PLATELET
BASOS ABS: 0 10*3/uL (ref 0.0–0.1)
Basophils Relative: 0.3 % (ref 0.0–3.0)
EOS ABS: 0.1 10*3/uL (ref 0.0–0.7)
Eosinophils Relative: 1.8 % (ref 0.0–5.0)
HEMATOCRIT: 44.4 % (ref 39.0–52.0)
Hemoglobin: 15.2 g/dL (ref 13.0–17.0)
LYMPHS ABS: 1.6 10*3/uL (ref 0.7–4.0)
Lymphocytes Relative: 23 % (ref 12.0–46.0)
MCHC: 34.3 g/dL (ref 30.0–36.0)
MCV: 91.4 fl (ref 78.0–100.0)
MONOS PCT: 6 % (ref 3.0–12.0)
Monocytes Absolute: 0.4 10*3/uL (ref 0.1–1.0)
Neutro Abs: 4.9 10*3/uL (ref 1.4–7.7)
Neutrophils Relative %: 68.9 % (ref 43.0–77.0)
Platelets: 155 10*3/uL (ref 150.0–400.0)
RBC: 4.86 Mil/uL (ref 4.22–5.81)
RDW: 11.9 % (ref 11.5–15.5)
WBC: 7.1 10*3/uL (ref 4.0–10.5)

## 2014-08-23 LAB — HEPATIC FUNCTION PANEL
ALBUMIN: 4.2 g/dL (ref 3.5–5.2)
ALT: 25 U/L (ref 0–53)
AST: 24 U/L (ref 0–37)
Alkaline Phosphatase: 67 U/L (ref 39–117)
Bilirubin, Direct: 0.2 mg/dL (ref 0.0–0.3)
Total Bilirubin: 1.5 mg/dL — ABNORMAL HIGH (ref 0.2–1.2)
Total Protein: 6.5 g/dL (ref 6.0–8.3)

## 2014-08-23 LAB — BASIC METABOLIC PANEL
BUN: 27 mg/dL — AB (ref 6–23)
CALCIUM: 9.4 mg/dL (ref 8.4–10.5)
CO2: 24 mEq/L (ref 19–32)
Chloride: 109 mEq/L (ref 96–112)
Creatinine, Ser: 1.1 mg/dL (ref 0.4–1.5)
GFR: 72.81 mL/min (ref >60.00)
Glucose, Bld: 97 mg/dL (ref 70–99)
POTASSIUM: 4.2 meq/L (ref 3.5–5.1)
Sodium: 142 mEq/L (ref 135–145)

## 2014-08-23 LAB — HEMOGLOBIN A1C: Hgb A1c MFr Bld: 5.2 % (ref 4.6–6.5)

## 2014-08-23 LAB — TSH: TSH: 1.26 u[IU]/mL (ref 0.35–4.50)

## 2014-08-23 LAB — PSA: PSA: 0.53 ng/mL (ref 0.10–4.00)

## 2014-08-23 MED ORDER — ESOMEPRAZOLE MAGNESIUM 40 MG PO CPDR: 40.0000 mg | DELAYED_RELEASE_CAPSULE | Freq: Two times a day (BID) | ORAL | Status: DC

## 2014-08-23 NOTE — Patient Instructions (Addendum)
You have been scheduled for a Barium Esophogram at [facility] (1st floor of the hospital) on 08-30-2014 at 10 AM. Please arrive 15 minutes prior to your appointment for registration. Make certain not to have anything to eat or drink 6 hours prior to your test. If you need to reschedule for any reason, please contact radiology at [phone] to do so. __________________________________________________________________ A barium swallow is an examination that concentrates on views of the esophagus. This tends to be a double contrast exam (barium and two liquids which, when combined, create a gas to distend the wall of the oesophagus) or single contrast (non-ionic iodine based). The study is usually tailored to your symptoms so a good history is essential. Attention is paid during the study to the form, structure and configuration of the esophagus, looking for functional disorders (such as aspiration, dysphagia, achalasia, motility and reflux) EXAMINATION You may be asked to change into a gown, depending on the type of swallow being performed. A radiologist and radiographer will perform the procedure. The radiologist will advise you of the type of contrast selected for your procedure and direct you during the exam. You will be asked to stand, sit or lie in several different positions and to hold a small amount of fluid in your mouth before being asked to swallow while the imaging is performed .In some instances you may be asked to swallow barium coated marshmallows to assess the motility of a solid food bolus. The exam can be recorded as a digital or video fluoroscopy procedure. POST PROCEDURE It will take 1-2 days for the barium to pass through your system. To facilitate this, it is important, unless otherwise directed, to increase your fluids for the next 24-48hrs and to resume your normal diet.  This test typically takes about 30 minutes to  perform. __________________________________________________________________________________   Please increase Nexium to twice daily, new prescription has been sent to your pharmacy.

## 2014-08-23 NOTE — Progress Notes (Signed)
Reviewed and agree with management plan.  Malcolm T. Stark, MD FACG 

## 2014-08-23 NOTE — Progress Notes (Signed)
     08/23/2014 MYCAH FORMICA [id number] 1951-03-02   History of Present Illness:  This is a pleasant 63 year old male who is known to Dr. Fuller Plan.  Had recent colonoscopy in 11/2013 at which time he had two polyps removed; one tubular adenoma and one sessile serrated polyp, negative for dysplasia; also had internal hemorrhoids.  He is also treated for GERD.  Was previously on Dexilant 60 mg daily for several years, which worked well, however, he recently changed jobs and now his insurance will not cover it.  He is now on Nexium 40 mg daily, but does not think that it helps much.  He presents to our office today with complaints of intermittent dysphagia, maybe occurring once every 2-3 weeks or so.  Says that he "gets choked" and coughs a lot then brings the food back up.  Also "gets choked" in his sleep at times.  He works third shift so he admits to eating breakfast in the morning right before lying down to sleep.  Also complains of hoarseness in his throat.      Last EGD was in 11/2007 at which time he only had gastritis and GERD.  Esophagus was dilated empirically at that time, but no stricture was seen.   Current Medications, Allergies, Past Medical History, Past Surgical History, Family History and Social History were reviewed in [employer] record.   Physical Exam: BP 120/70 mmHg  Pulse 79  Ht 5\' 10"  (1.778 m)  Wt 236 lb 9.6 oz (107.321 kg)  BMI 33.95 kg/m2 General: Well developed white male in no acute distress Head: Normocephalic and atraumatic Eyes:  Sclerae anicteric, conjunctiva pink  Ears: Normal auditory acuity Lungs: Clear throughout to auscultation Heart: Regular rate and rhythm Abdomen: Soft, non-distended.  Normal bowel sounds.  Non-tender. Musculoskeletal: Symmetrical with no gross deformities  Extremities: No edema  Neurological: Alert oriented x 4, grossly non-focal Psychological:  Alert and cooperative. Normal mood and affect  Assessment  and Recommendations: -Dysphagia:  Intermittent to solid food.  Does not have history of stricture.  ? Stricture vs spasm due to uncontrolled reflux.  Will check esophagram first to see if there is anything amenable to endoscopic therapy. -GERD:  Not well controlled recently.  Dexilant worked well previously but not covered by Insurance underwriter.  Will increase Nexium to 40 mg BID.  Discussed GERD dietary measures including waiting 2-3 hours between eating and going to bed.

## 2014-08-25 LAB — NMR LIPOPROFILE WITH LIPIDS
CHOLESTEROL, TOTAL: 159 mg/dL (ref 100–199)
HDL Particle Number: 27.5 umol/L — ABNORMAL LOW (ref >=30.5)
HDL SIZE: 8.6 nm — AB (ref >=9.2)
HDL-C: 34 mg/dL — ABNORMAL LOW (ref >39)
LARGE VLDL-P: 6 nmol/L — AB (ref <=2.7)
LDL (calc): 86 mg/dL (ref 0–99)
LDL Particle Number: 1255 nmol/L — ABNORMAL HIGH (ref <1000)
LDL Size: 20.1 nm (ref >=20.8)
LP-IR Score: 77 — ABNORMAL HIGH (ref <=45)
Large HDL-P: 2.3 umol/L — ABNORMAL LOW (ref >=4.8)
Small LDL Particle Number: 758 nmol/L — ABNORMAL HIGH (ref <=527)
Triglycerides: 197 mg/dL — ABNORMAL HIGH (ref 0–149)
VLDL Size: 51.1 nm — ABNORMAL HIGH (ref <=46.6)

## 2014-08-30 ENCOUNTER — Ambulatory Visit (HOSPITAL_COMMUNITY)
Admission: RE | Admit: 2014-08-30 | Discharge: 2014-08-30 | Disposition: A | Discharge status: Home / Self Care | Payer: Managed Care, Other (non HMO) | Source: Ambulatory Visit | Attending: Gastroenterology | Admitting: Gastroenterology

## 2014-08-30 DIAGNOSIS — R1310 Dysphagia, unspecified: Secondary | ICD-10-CM | POA: Diagnosis present

## 2014-09-02 ENCOUNTER — Other Ambulatory Visit: Payer: Self-pay | Admitting: Internal Medicine

## 2014-09-02 ENCOUNTER — Encounter: Payer: Self-pay | Admitting: Internal Medicine

## 2014-09-02 MED ORDER — FLUOXETINE HCL 20 MG PO CAPS: 20.0000 mg | ORAL_CAPSULE | Freq: Every day | ORAL | Status: DC

## 2014-10-09 ENCOUNTER — Other Ambulatory Visit: Payer: Self-pay | Admitting: Internal Medicine

## 2014-10-24 ENCOUNTER — Other Ambulatory Visit: Payer: Self-pay | Admitting: Internal Medicine

## 2014-11-03 ENCOUNTER — Encounter: Payer: Self-pay | Admitting: Internal Medicine

## 2014-11-03 MED ORDER — DEXLANSOPRAZOLE 60 MG PO CPDR: 60.0000 mg | DELAYED_RELEASE_CAPSULE | Freq: Every day | ORAL | Status: DC

## 2014-11-05 ENCOUNTER — Telehealth: Payer: Self-pay

## 2014-11-05 NOTE — Telephone Encounter (Signed)
Faxed additional documentation (08/23/14 office note) to [facility] stating why patient needs to remain on Dexilant 60 mg. Also sent 11/03/14 email from patient.

## 2014-12-17 ENCOUNTER — Other Ambulatory Visit: Payer: Self-pay | Admitting: Internal Medicine

## 2015-05-27 ENCOUNTER — Other Ambulatory Visit: Payer: Self-pay | Admitting: Emergency Medicine

## 2015-05-27 MED ORDER — TAMSULOSIN HCL 0.4 MG PO CAPS: 0.4000 mg | ORAL_CAPSULE | Freq: Every day | ORAL | Status: DC

## 2015-06-09 ENCOUNTER — Telehealth: Payer: Self-pay | Admitting: Internal Medicine

## 2015-06-09 DIAGNOSIS — Z0000 Encounter for general adult medical examination without abnormal findings: Secondary | ICD-10-CM

## 2015-06-09 DIAGNOSIS — R972 Elevated prostate specific antigen [PSA]: Secondary | ICD-10-CM

## 2015-06-09 NOTE — Telephone Encounter (Signed)
Pt was wondering if he can have blood work done prior to his appt 06/17/15, he wants to come and do it in the morning if it is fasting lab. Pt has Garment/textile technologist for insurance. Please advise.

## 2015-06-10 NOTE — Telephone Encounter (Signed)
Called pt no answer LMOM cpx labs has been entered....Richard Trevino

## 2015-06-13 ENCOUNTER — Other Ambulatory Visit (INDEPENDENT_AMBULATORY_CARE_PROVIDER_SITE_OTHER): Payer: Managed Care, Other (non HMO)

## 2015-06-13 ENCOUNTER — Encounter: Payer: Managed Care, Other (non HMO) | Admitting: Internal Medicine

## 2015-06-13 DIAGNOSIS — R7989 Other specified abnormal findings of blood chemistry: Secondary | ICD-10-CM | POA: Diagnosis not present

## 2015-06-13 DIAGNOSIS — R972 Elevated prostate specific antigen [PSA]: Secondary | ICD-10-CM

## 2015-06-13 DIAGNOSIS — Z0000 Encounter for general adult medical examination without abnormal findings: Secondary | ICD-10-CM

## 2015-06-13 DIAGNOSIS — Z0189 Encounter for other specified special examinations: Secondary | ICD-10-CM | POA: Diagnosis not present

## 2015-06-13 LAB — URINALYSIS, ROUTINE W REFLEX MICROSCOPIC
Bilirubin Urine: NEGATIVE
Hgb urine dipstick: NEGATIVE
Ketones, ur: NEGATIVE
Leukocytes, UA: NEGATIVE
Nitrite: NEGATIVE
RBC / HPF: NONE SEEN
Specific Gravity, Urine: 1.025
Total Protein, Urine: NEGATIVE
Urine Glucose: NEGATIVE
Urobilinogen, UA: 0.2
pH: 5.5 (ref 5.0–8.0)

## 2015-06-13 LAB — LDL CHOLESTEROL, DIRECT: Direct LDL: 106 mg/dL

## 2015-06-13 LAB — CBC WITH DIFFERENTIAL/PLATELET
BASOS ABS: 0 10*3/uL (ref 0.0–0.1)
BASOS PCT: 0.2 % (ref 0.0–3.0)
EOS ABS: 0.1 10*3/uL (ref 0.0–0.7)
Eosinophils Relative: 1.9 % (ref 0.0–5.0)
HEMATOCRIT: 46.3 % (ref 39.0–52.0)
HEMOGLOBIN: 15.9 g/dL (ref 13.0–17.0)
LYMPHS PCT: 23.4 % (ref 12.0–46.0)
Lymphs Abs: 1.7 10*3/uL (ref 0.7–4.0)
MCHC: 34.3 g/dL (ref 30.0–36.0)
MCV: 91 fl (ref 78.0–100.0)
Monocytes Absolute: 0.4 10*3/uL (ref 0.1–1.0)
Monocytes Relative: 5.7 % (ref 3.0–12.0)
Neutro Abs: 4.9 10*3/uL (ref 1.4–7.7)
Neutrophils Relative %: 68.8 % (ref 43.0–77.0)
Platelets: 172 10*3/uL (ref 150.0–400.0)
RBC: 5.09 Mil/uL (ref 4.22–5.81)
RDW: 12.4 % (ref 11.5–15.5)
WBC: 7.1 10*3/uL (ref 4.0–10.5)

## 2015-06-13 LAB — TSH: TSH: 1.64 u[IU]/mL (ref 0.35–4.50)

## 2015-06-13 LAB — HEPATIC FUNCTION PANEL
ALBUMIN: 4.2 g/dL (ref 3.5–5.2)
ALK PHOS: 74 U/L (ref 39–117)
ALT: 24 U/L (ref 0–53)
AST: 19 U/L (ref 0–37)
BILIRUBIN DIRECT: 0.2 mg/dL (ref 0.0–0.3)
TOTAL PROTEIN: 6.6 g/dL (ref 6.0–8.3)
Total Bilirubin: 1.4 mg/dL — ABNORMAL HIGH (ref 0.2–1.2)

## 2015-06-13 LAB — BASIC METABOLIC PANEL
BUN: 18 mg/dL (ref 6–23)
CHLORIDE: 107 meq/L (ref 96–112)
CO2: 27 meq/L (ref 19–32)
Calcium: 9.3 mg/dL (ref 8.4–10.5)
Creatinine, Ser: 1.11 mg/dL (ref 0.40–1.50)
GFR: 71.11 mL/min (ref >60.00)
GLUCOSE: 104 mg/dL — AB (ref 70–99)
POTASSIUM: 4 meq/L (ref 3.5–5.1)
Sodium: 141 mEq/L (ref 135–145)

## 2015-06-13 LAB — LIPID PANEL
Cholesterol: 177 mg/dL (ref 0–200)
HDL: 30.2 mg/dL — ABNORMAL LOW (ref >39.00)
NONHDL: 146.65
Total CHOL/HDL Ratio: 6
Triglycerides: 345 mg/dL — ABNORMAL HIGH (ref 0.0–149.0)
VLDL: 69 mg/dL — AB (ref 0.0–40.0)

## 2015-06-13 LAB — PSA: PSA: 0.75 ng/mL (ref 0.10–4.00)

## 2015-06-17 ENCOUNTER — Other Ambulatory Visit (INDEPENDENT_AMBULATORY_CARE_PROVIDER_SITE_OTHER): Payer: Managed Care, Other (non HMO)

## 2015-06-17 DIAGNOSIS — R739 Hyperglycemia, unspecified: Secondary | ICD-10-CM | POA: Diagnosis not present

## 2015-06-17 LAB — HEMOGLOBIN A1C: Hgb A1c MFr Bld: 5.1 % (ref 4.6–6.5)

## 2015-06-20 ENCOUNTER — Ambulatory Visit (INDEPENDENT_AMBULATORY_CARE_PROVIDER_SITE_OTHER): Payer: Managed Care, Other (non HMO) | Admitting: Internal Medicine

## 2015-06-20 ENCOUNTER — Encounter: Payer: Self-pay | Admitting: Internal Medicine

## 2015-06-20 VITALS — BP 130/84 | HR 84 | Temp 98.4°F | Resp 16 | Ht 71.0 in | Wt 243.0 lb

## 2015-06-20 DIAGNOSIS — Z23 Encounter for immunization: Secondary | ICD-10-CM

## 2015-06-20 DIAGNOSIS — Z0189 Encounter for other specified special examinations: Secondary | ICD-10-CM | POA: Diagnosis not present

## 2015-06-20 DIAGNOSIS — Z0000 Encounter for general adult medical examination without abnormal findings: Secondary | ICD-10-CM

## 2015-06-20 MED ORDER — PANTOPRAZOLE SODIUM 40 MG PO TBEC: 40.0000 mg | DELAYED_RELEASE_TABLET | Freq: Every day | ORAL | Status: DC

## 2015-06-20 NOTE — Progress Notes (Signed)
   Subjective:    Patient ID: Richard Trevino, male    DOB: [birth year], 64 y.o.   MRN: [id number]  HPI The patient is here for physical to assess status of active health conditions.  PMH, FH, & Social History reviewed & updated.No change in [place] as recorded.  He ingests  red meat and fried foods. He does not add salt at the table. He's physically active on the job without a definitive exercise program. He denies active cardio pulmonary symptoms except for some exertional dyspnea  He quit smoking in his teens. He does not drink alcohol.  He had a colonoscopy in March 2015. He had a serrated polyp as well as a tubular adenoma. Path report given to them and F/U recommended in 2020. He has no active GI symptoms  His major active symptom is pain at the base of the left thumb. He describes stiffness after he sleeps for a while. He denies frank numbness and tingling of the hand. His mother and maternal aunt had non-rheumatoid arthritis. 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  Review of Systems  Chest pain, palpitations, tachycardia, exertional dyspnea, paroxysmal nocturnal dyspnea, claudication or edema are absent. No unexplained weight loss, abdominal pain, significant dyspepsia, dysphagia, melena, rectal bleeding, or persistently small caliber stools. Dysuria, pyuria, hematuria, frequency, nocturia or polyuria are denied. Change in hair, skin, nails denied. No bowel changes of constipation or diarrhea. No intolerance to heat or cold.      Objective:   Physical Exam  Pertinent or positive findings include: Pattern alopecia is present. He has a mustache. Nasal septum dislocated to the left. Abdomen is protuberant. He has crepitus greater in the right knee than the left. Varices are noted in the left scrotum. Prostate is upper limits of normal with slight asymmetry.  General appearance :adequately nourished; in no distress.  Eyes: No conjunctival  inflammation or scleral icterus is present.  Oral exam:  Lips and gums are healthy appearing.There is no oropharyngeal erythema or exudate noted. Dental hygiene is good.  Heart:  Normal rate and regular rhythm. S1 and S2 normal without gallop, murmur, click, rub or other extra sounds    Lungs:Chest clear to auscultation; no wheezes, rhonchi,rales ,or rubs present.No increased work of breathing.   Abdomen: bowel sounds normal, soft and non-tender without masses, organomegaly or hernias noted.  No guarding or rebound.   Vascular : all pulses equal ; no bruits present.  Skin:Warm & dry.  Intact without suspicious lesions or rashes ; no tenting or jaundice   Lymphatic: No lymphadenopathy is noted about the head, neck, axilla, or inguinal areas.   Neuro: Strength, tone & DTRs normal.   MS: no hand deformities; full ROM and normal strength.       Assessment & Plan:  #1 comprehensive physical exam; no acute findings  #2 hand pain, localized, mainly at the base of the left thumb. Films will be pursued if no better with recommendations in AVS.  Plan: see Orders  & Recommendations

## 2015-06-20 NOTE — Progress Notes (Signed)
Pre visit review using our clinic review tool, if applicable. No additional management support is needed unless otherwise documented below in the visit note. 

## 2015-06-20 NOTE — Patient Instructions (Signed)
Use an anti-inflammatory cream such as Aspercreme or Zostrix cream twice a day to the affected area as needed. In lieu of this warm moist compresses or  hot water bottle can be used. Do not apply ice .  Consider glucosamine sulfate 1500 mg daily for joint symptoms. Take this daily  for 3 months and then leave it off for 2 months. This will rehydrate the cartilages.

## 2015-08-28 ENCOUNTER — Other Ambulatory Visit: Payer: Self-pay | Admitting: Internal Medicine

## 2015-09-11 ENCOUNTER — Other Ambulatory Visit: Payer: Self-pay | Admitting: Internal Medicine

## 2015-09-12 ENCOUNTER — Other Ambulatory Visit: Payer: Self-pay | Admitting: Emergency Medicine

## 2015-09-12 MED ORDER — HYDROCHLOROTHIAZIDE 25 MG PO TABS: 12.5000 mg | ORAL_TABLET | Freq: Every day | ORAL | Status: DC

## 2015-09-30 ENCOUNTER — Other Ambulatory Visit: Payer: Self-pay | Admitting: Internal Medicine

## 2015-11-26 ENCOUNTER — Other Ambulatory Visit: Payer: Self-pay | Admitting: Internal Medicine

## 2015-11-27 ENCOUNTER — Other Ambulatory Visit: Payer: Self-pay | Admitting: Internal Medicine

## 2015-11-28 NOTE — Telephone Encounter (Signed)
Left message advising patient to call back to schedule appt with new pcp----let Richard Trevino know when appt is make so that prozac rx refill can be sent to pharm

## 2015-11-28 NOTE — Telephone Encounter (Signed)
Left message advising patient he needs to call back in to schedule appt with new pcp----his appt can be august or September/2017---let tamara know when appt is made so that protonix rx request can be sent to patient's pharm

## 2015-12-13 ENCOUNTER — Other Ambulatory Visit: Payer: Self-pay | Admitting: Internal Medicine

## 2015-12-13 NOTE — Telephone Encounter (Signed)
Left message advising patient to call back to schedule appt/get established with new pcp---appt can be aug/sept 2017---let tamara know when appt is make so that flomax refill can be sent to pharm

## 2015-12-19 MED ORDER — FLUOXETINE HCL 20 MG PO CAPS: 20.0000 mg | ORAL_CAPSULE | Freq: Every day | ORAL | Status: DC

## 2015-12-19 NOTE — Telephone Encounter (Signed)
Received call from pt/wife pt has made appt for his CPX w/Dr. Quay Burow in sept. She states he is needing his tamulosin & [facility]. Inform will send electronically...Richard Trevino

## 2016-06-25 ENCOUNTER — Encounter: Payer: Managed Care, Other (non HMO) | Admitting: Internal Medicine

## 2016-06-26 ENCOUNTER — Encounter: Payer: Self-pay | Admitting: Internal Medicine

## 2016-06-26 ENCOUNTER — Ambulatory Visit (INDEPENDENT_AMBULATORY_CARE_PROVIDER_SITE_OTHER): Payer: Self-pay | Admitting: Internal Medicine

## 2016-06-26 VITALS — BP 152/82 | HR 82 | Temp 97.9°F | Resp 16 | Wt 245.0 lb

## 2016-06-26 DIAGNOSIS — E785 Hyperlipidemia, unspecified: Secondary | ICD-10-CM

## 2016-06-26 DIAGNOSIS — R972 Elevated prostate specific antigen [PSA]: Secondary | ICD-10-CM

## 2016-06-26 DIAGNOSIS — Z114 Encounter for screening for human immunodeficiency virus [HIV]: Secondary | ICD-10-CM

## 2016-06-26 DIAGNOSIS — I10 Essential (primary) hypertension: Secondary | ICD-10-CM

## 2016-06-26 DIAGNOSIS — K219 Gastro-esophageal reflux disease without esophagitis: Secondary | ICD-10-CM

## 2016-06-26 DIAGNOSIS — R739 Hyperglycemia, unspecified: Secondary | ICD-10-CM

## 2016-06-26 DIAGNOSIS — D696 Thrombocytopenia, unspecified: Secondary | ICD-10-CM

## 2016-06-26 DIAGNOSIS — Z1159 Encounter for screening for other viral diseases: Secondary | ICD-10-CM

## 2016-06-26 DIAGNOSIS — N401 Enlarged prostate with lower urinary tract symptoms: Secondary | ICD-10-CM

## 2016-06-26 MED ORDER — PANTOPRAZOLE SODIUM 40 MG PO TBEC: 40.0000 mg | DELAYED_RELEASE_TABLET | Freq: Every day | ORAL | 1 refills | Status: DC

## 2016-06-26 MED ORDER — TAMSULOSIN HCL 0.4 MG PO CAPS: 0.4000 mg | ORAL_CAPSULE | Freq: Every day | ORAL | 1 refills | Status: DC

## 2016-06-26 MED ORDER — LOSARTAN POTASSIUM 100 MG PO TABS: 100.0000 mg | ORAL_TABLET | Freq: Every day | ORAL | 3 refills | Status: DC

## 2016-06-26 MED ORDER — FLUOXETINE HCL 20 MG PO CAPS: 20.0000 mg | ORAL_CAPSULE | Freq: Every day | ORAL | 1 refills | Status: DC

## 2016-06-26 NOTE — Progress Notes (Signed)
Subjective:    Patient ID: Richard Trevino, male    DOB: Apr 01, 1951, 65 y.o.   MRN: [id number]  HPI He is here to establish with a new pcp.   He is here for follow up.  Hypertension: He is taking his medication daily. He is not always compliant with a low sodium diet.  He denies chest pain, palpitations, edema, shortness of breath and regular headaches. He is not exercising regularly.  He does monitor his blood pressure at home 129/90, 149/96, 151/105, 135/89.    Hyperlipidemia: He is taking his medication daily. He is compliant with a low fat/cholesterol diet. He is not exercising regularly.   GERD:  He is taking his medication daily as prescribed.  He denies any GERD symptoms and feels his GERD is well controlled.   BPH:  He is taking flomax daily.  He denies difficulty urinating or change in his urination.  He feels the medication is working well.    Restless legs: Some nights when he lays down he has restless feeling in legs.  He is still able to sleep. He does not have the symptoms outside of laying in his bed.   Bites on left leg:   Several bites on his leg. The itch and one looks like it is turing purple.  Some were blisters and were oozing fluid.  He does not think it is poison ivy.     Medications and allergies reviewed with patient and updated if appropriate.  Patient Active Problem List   Diagnosis Date Noted  . Dysphagia 08/23/2014  . Esophageal reflux 08/23/2014  . Rising PSA level 08/16/2014  . Thrombocytopenia ([place]) 07/07/2012  . SKIN CANCER, HX OF 11/07/2009  . Hyperglycemia 11/04/2008  . Hyperlipidemia 09/08/2007  . Essential hypertension 09/08/2007  . GERD (gastroesophageal reflux disease) 09/08/2007  . Hyperplasia of prostate with lower urinary tract symptoms (LUTS) 04/21/2007  . LOW BACK PAIN 02/25/2007  . NEPHROLITHIASIS, HX OF 02/25/2007    Current Outpatient Prescriptions on File Prior to Visit  Medication Sig Dispense Refill  . aspirin 81 MG tablet  Take 81 mg by mouth daily.    . Cholecalciferol (VITAMIN D3) 1000 UNITS CAPS Take by mouth daily.    Marland Kitchen CINNAMON PO Take 1,000 mg by mouth daily.    . Coenzyme Q10 (COQ10) 50 MG CAPS Take by mouth daily.    . Cyanocobalamin 1500 MCG TBDP Take 1 tablet by mouth daily.    . Flaxseed, Linseed, (FLAX SEED OIL) 1000 MG CAPS Take by mouth.    Marland Kitchen FLUoxetine (PROZAC) 20 MG capsule Take 1 capsule (20 mg total) by mouth daily. Keep sept appt w/new PCP for refills 90 capsule 1  . Glucosamine Sulfate 1000 MG TABS Take by mouth. 2 by mouth daily    . hydrochlorothiazide (HYDRODIURIL) 25 MG tablet Take 0.5 tablets (12.5 mg total) by mouth daily. 90 tablet 2  . losartan (COZAAR) 100 MG tablet Take 1 tablet (100 mg total) by mouth 2 (two) times daily. (Patient taking differently: Take 50 mg by mouth 2 (two) times daily. ) 90 tablet 3  . Magnesium 250 MG TABS Take 250 mg by mouth daily.    . Melatonin 5 MG TABS Take 10 mg by mouth daily.    . Multiple Vitamin (MULTIVITAMIN) tablet Take 1 tablet by mouth daily.      . NON FORMULARY Celery seed extract 600 mg daily    . pantoprazole (PROTONIX) 40 MG tablet Take 1 tablet (40  mg total) by mouth daily. 30 minutes before breakfast. 90 tablet 1  . pravastatin (PRAVACHOL) 20 MG tablet TAKE ONE-HALF (1/2) TABLET NIGHTLY AT BEDTIME 45 tablet 3  . tamsulosin (FLOMAX) 0.4 MG CAPS capsule Take 1 capsule (0.4 mg total) by mouth daily. Keep Sept appt w/new PCP for refills 90 capsule 1  . vitamin E 400 UNIT capsule Take 400 Units by mouth daily.    Marland Kitchen zinc gluconate 50 MG tablet Take 50 mg by mouth daily.     No current facility-administered medications on file prior to visit.     Past Medical History:  Diagnosis Date  . Fasting hyperglycemia   . GERD (gastroesophageal reflux disease)   . Gilbert's syndrome   . Internal hemorrhoids   . Multiple fracture [id number]   Coccyx, finger,hand  . Rectal bleeding   . Renal calculi    X 2  . Skin cancer    ? Squamous cell; Dr  Angie Fava    Past Surgical History:  Procedure Laterality Date  . CHOLECYSTECTOMY  2003   Dr. Joseph Pierini   . COLONOSCOPY  2003   negative  . LITHOTRIPSY  2007   Dr Barnie Del, [facility]  . UPPER GASTROINTESTINAL ENDOSCOPY  2003   Dr Fuller Plan    Social History   Social History  . Marital status: Married    Spouse name: Pamala Hurry  . Number of children: 3  . Years of education: N/A   Occupational History  . 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 Smoking status: Former Smoker    Quit date: 10/02/1967  . Smokeless tobacco: Never Used     Comment: smoked 1968-69, up to 1/2 ppd  . Alcohol use No  . Drug use: No  . Sexual activity: Not on file   Other Topics Concern  . Not on file   Social History Narrative  . No narrative on file    Family History  Problem Relation Age of Onset  . Emphysema Father   . Hypertension Father   . Stroke Father      < 21  . Hypertension Mother   . Arthritis Mother   . Multiple myeloma Mother   . Skin cancer Brother   . Prostate cancer Brother   . Stroke Brother      > 55  . Depression Brother   . Cirrhosis Brother   . Heart attack Maternal Uncle     X2; both > 55  . Prostate cancer Maternal Uncle   . Diabetes Neg Hx     Review of Systems  Constitutional: Negative for fever.  Respiratory: Negative for cough, shortness of breath and wheezing.   Cardiovascular: Negative for chest pain, palpitations and leg swelling.  Gastrointestinal: Negative for abdominal pain.  Genitourinary: Negative for difficulty urinating, dysuria and hematuria.       Objective:   Vitals:   06/26/16 1301  BP: (!) 152/82  Pulse: 82  Resp: 16  Temp: 97.9 F (36.6 C)   Filed Weights   06/26/16 1301  Weight: 245 lb (111.1 kg)   Body mass index is 34.17 kg/m.   Physical Exam Constitutional: Appears well-developed and well-nourished. No distress.  HENT:  Head: Normocephalic and atraumatic.  Neck: Neck supple. No tracheal deviation  present. No thyromegaly present.  No cervical lymphadenopathy Cardiovascular: Normal rate, regular rhythm and normal heart sounds.   No murmur heard. No carotid bruit .  No edema Pulmonary/Chest: Effort normal and breath sounds normal. No  respiratory distress. No has no wheezes. No rales.  Skin: Skin is warm and dry. Not diaphoretic. macular papular rash with small blisters - no surrounding erythema, no open wounds or discharge Psychiatric: Normal mood and affect. Behavior is normal.       Assessment & Plan:   See Problem List for Assessment and Plan of chronic medical problems.  F/u annually

## 2016-06-26 NOTE — Patient Instructions (Addendum)
Goal BP is less than 150/90 on average.   Test(s) ordered today. Your results will be released to [place] (or called to you) after review, usually within 72hours after test completion. If any changes need to be made, you will be notified at that same time.  All other Health Maintenance issues reviewed.   All recommended immunizations and age-appropriate screenings are up-to-date or discussed.  No immunizations administered today.   Medications reviewed and updated.  Changes include increasing the losartan to 100 mg daily.  Your prescription(s) have been submitted to your pharmacy. Please take as directed and contact our office if you believe you are having problem(s) with the medication(s).  Work on increasing your exercise and weight loss.    Please followup in one year for a physical.

## 2016-06-26 NOTE — Assessment & Plan Note (Signed)
Not ideally controlled Increase losartan to 100 mg daily Continue hctz 12. 5 mg daily stressed exercise, weight loss and low sodium diet cmp and basic blood work

## 2016-06-26 NOTE — Assessment & Plan Note (Signed)
a1c Stressed exercise and weight loss

## 2016-06-26 NOTE — Assessment & Plan Note (Signed)
Controlled with flomax Continue same

## 2016-06-26 NOTE — Assessment & Plan Note (Signed)
Check lipid panel.  Continue statin. 

## 2016-06-26 NOTE — Progress Notes (Signed)
Patient received education resource, including the self-management goal and tool. Patient verbalized understanding. 

## 2016-06-26 NOTE — Assessment & Plan Note (Signed)
GERD controlled Continue daily medication  

## 2016-06-26 NOTE — Assessment & Plan Note (Signed)
cbc

## 2016-06-26 NOTE — Assessment & Plan Note (Signed)
Check psa 

## 2016-10-09 ENCOUNTER — Other Ambulatory Visit: Payer: Self-pay | Admitting: Emergency Medicine

## 2016-10-09 MED ORDER — TAMSULOSIN HCL 0.4 MG PO CAPS: 0.4000 mg | ORAL_CAPSULE | Freq: Every day | ORAL | 1 refills | Status: DC

## 2016-10-24 ENCOUNTER — Other Ambulatory Visit (INDEPENDENT_AMBULATORY_CARE_PROVIDER_SITE_OTHER): Payer: Self-pay

## 2016-10-24 DIAGNOSIS — I10 Essential (primary) hypertension: Secondary | ICD-10-CM

## 2016-10-24 DIAGNOSIS — Z1159 Encounter for screening for other viral diseases: Secondary | ICD-10-CM

## 2016-10-24 DIAGNOSIS — R739 Hyperglycemia, unspecified: Secondary | ICD-10-CM

## 2016-10-24 DIAGNOSIS — E785 Hyperlipidemia, unspecified: Secondary | ICD-10-CM

## 2016-10-24 DIAGNOSIS — Z114 Encounter for screening for human immunodeficiency virus [HIV]: Secondary | ICD-10-CM

## 2016-10-24 DIAGNOSIS — K219 Gastro-esophageal reflux disease without esophagitis: Secondary | ICD-10-CM

## 2016-10-24 LAB — COMPREHENSIVE METABOLIC PANEL
ALBUMIN: 4.4 g/dL (ref 3.5–5.2)
ALK PHOS: 68 U/L (ref 39–117)
ALT: 23 U/L (ref 0–53)
AST: 19 U/L (ref 0–37)
BILIRUBIN TOTAL: 2.1 mg/dL — AB (ref 0.2–1.2)
BUN: 19 mg/dL (ref 6–23)
CO2: 28 mEq/L (ref 19–32)
Calcium: 9.3 mg/dL (ref 8.4–10.5)
Chloride: 106 mEq/L (ref 96–112)
Creatinine, Ser: 1.11 mg/dL (ref 0.40–1.50)
GFR: 70.8 mL/min (ref >60.00)
Glucose, Bld: 117 mg/dL — ABNORMAL HIGH (ref 70–99)
Potassium: 3.8 mEq/L (ref 3.5–5.1)
SODIUM: 140 meq/L (ref 135–145)
TOTAL PROTEIN: 6.8 g/dL (ref 6.0–8.3)

## 2016-10-24 LAB — CBC WITH DIFFERENTIAL/PLATELET
BASOS ABS: 0 10*3/uL (ref 0.0–0.1)
Basophils Relative: 0.5 % (ref 0.0–3.0)
EOS ABS: 0.1 10*3/uL (ref 0.0–0.7)
EOS PCT: 2.5 % (ref 0.0–5.0)
HCT: 43 % (ref 39.0–52.0)
HEMOGLOBIN: 15.3 g/dL (ref 13.0–17.0)
Lymphocytes Relative: 24.1 % (ref 12.0–46.0)
Lymphs Abs: 1.4 10*3/uL (ref 0.7–4.0)
MCHC: 35.5 g/dL (ref 30.0–36.0)
MCV: 89.2 fl (ref 78.0–100.0)
MONO ABS: 0.4 10*3/uL (ref 0.1–1.0)
Monocytes Relative: 7.3 % (ref 3.0–12.0)
Neutro Abs: 3.9 10*3/uL (ref 1.4–7.7)
Neutrophils Relative %: 65.6 % (ref 43.0–77.0)
Platelets: 166 10*3/uL (ref 150.0–400.0)
RBC: 4.82 Mil/uL (ref 4.22–5.81)
RDW: 11.9 % (ref 11.5–15.5)
WBC: 5.9 10*3/uL (ref 4.0–10.5)

## 2016-10-24 LAB — HEPATITIS C ANTIBODY: HCV Ab: NEGATIVE

## 2016-10-24 LAB — HIV ANTIBODY (ROUTINE TESTING W REFLEX): HIV 1&2 Ab, 4th Generation: NONREACTIVE

## 2016-10-24 LAB — LIPID PANEL
Cholesterol: 156 mg/dL (ref 0–200)
HDL: 30.2 mg/dL — ABNORMAL LOW (ref >39.00)
NONHDL: 125.82
Total CHOL/HDL Ratio: 5
Triglycerides: 240 mg/dL — ABNORMAL HIGH (ref 0.0–149.0)
VLDL: 48 mg/dL — ABNORMAL HIGH (ref 0.0–40.0)

## 2016-10-24 LAB — HEMOGLOBIN A1C: HEMOGLOBIN A1C: 5.1 % (ref 4.6–6.5)

## 2016-10-24 LAB — LDL CHOLESTEROL, DIRECT: LDL DIRECT: 97 mg/dL

## 2016-10-24 LAB — TSH: TSH: 3.49 u[IU]/mL (ref 0.35–4.50)

## 2016-10-25 LAB — PSA, TOTAL AND FREE
PROSTATE SPECIFIC AG, SERUM: 0.7 ng/mL (ref 0.0–4.0)
PSA FREE: 0.14 ng/mL
PSA, Free Pct: 20 %

## 2016-10-27 ENCOUNTER — Encounter: Payer: Self-pay | Admitting: Internal Medicine

## 2016-12-12 ENCOUNTER — Telehealth: Payer: Self-pay

## 2016-12-12 MED ORDER — HYDROCHLOROTHIAZIDE 25 MG PO TABS: 12.5000 mg | ORAL_TABLET | Freq: Every day | ORAL | 1 refills | Status: DC

## 2016-12-12 NOTE — Telephone Encounter (Signed)
Pharmacy is requesting a new Hydrochlorothiazide Rx due to the one of file being expired.

## 2016-12-12 NOTE — Telephone Encounter (Signed)
Ok to send

## 2016-12-18 ENCOUNTER — Other Ambulatory Visit: Payer: Self-pay | Admitting: Emergency Medicine

## 2016-12-18 MED ORDER — HYDROCHLOROTHIAZIDE 25 MG PO TABS: 12.5000 mg | ORAL_TABLET | Freq: Every day | ORAL | 1 refills | Status: DC

## 2017-04-10 ENCOUNTER — Other Ambulatory Visit: Payer: Self-pay | Admitting: Internal Medicine

## 2017-06-26 ENCOUNTER — Ambulatory Visit: Payer: Self-pay | Admitting: Internal Medicine

## 2017-06-26 NOTE — Progress Notes (Deleted)
Subjective:    Patient ID: Richard Trevino, male    DOB: Feb 12, 1951, 66 y.o.   MRN: [id number]  HPI The patient is here for follow up.  Hypertension: He is taking his medication daily. He is compliant with a low sodium diet.  He denies chest pain, palpitations, edema, shortness of breath and regular headaches. He is exercising regularly.  He does not monitor his blood pressure at home.    Hyperlipidemia: He is taking his medication daily. He is compliant with a low fat/cholesterol diet. He is exercising regularly. He denies myalgias.   Hyperglycemia:  He is compliant with a low sugar/carbohydrate diet.  He is exercising regularly.  GERD:  He is taking his medication daily as prescribed.  He denies any GERD symptoms and feels his GERD is well controlled.    Medications and allergies reviewed with patient and updated if appropriate.  Patient Active Problem List   Diagnosis Date Noted  . Dysphagia 08/23/2014  . Rising PSA level 08/16/2014  . Thrombocytopenia ([place]) 07/07/2012  . SKIN CANCER, HX OF 11/07/2009  . Hyperglycemia 11/04/2008  . Hyperlipidemia 09/08/2007  . Essential hypertension 09/08/2007  . GERD (gastroesophageal reflux disease) 09/08/2007  . Hyperplasia of prostate with lower urinary tract symptoms (LUTS) 04/21/2007  . LOW BACK PAIN 02/25/2007  . NEPHROLITHIASIS, HX OF 02/25/2007    Current Outpatient Prescriptions on File Prior to Visit  Medication Sig Dispense Refill  . aspirin 81 MG tablet Take 81 mg by mouth daily.    . Cholecalciferol (VITAMIN D3) 1000 UNITS CAPS Take by mouth daily.    Marland Kitchen CINNAMON PO Take 1,000 mg by mouth daily.    . Coenzyme Q10 (COQ10) 50 MG CAPS Take by mouth daily.    . Cyanocobalamin 1500 MCG TBDP Take 1 tablet by mouth daily.    . Flaxseed, Linseed, (FLAX SEED OIL) 1000 MG CAPS Take by mouth.    Marland Kitchen FLUoxetine (PROZAC) 20 MG capsule Take 1 capsule (20 mg total) by mouth daily. 90 capsule 1  . Glucosamine Sulfate 1000 MG TABS Take by  mouth. 2 by mouth daily    . hydrochlorothiazide (HYDRODIURIL) 25 MG tablet Take 0.5 tablets (12.5 mg total) by mouth daily. 90 tablet 1  . losartan (COZAAR) 100 MG tablet Take 1 tablet (100 mg total) by mouth daily. 90 tablet 3  . Magnesium 250 MG TABS Take 250 mg by mouth daily.    . Melatonin 5 MG TABS Take 10 mg by mouth daily.    . Multiple Vitamin (MULTIVITAMIN) tablet Take 1 tablet by mouth daily.      . NON FORMULARY Celery seed extract 600 mg daily    . pantoprazole (PROTONIX) 40 MG tablet TAKE ONE TABLET BY MOUTH DAILY 30 MINUTES BEFORE BREAKFAST 90 tablet 1  . pravastatin (PRAVACHOL) 20 MG tablet TAKE ONE-HALF (1/2) TABLET NIGHTLY AT BEDTIME 45 tablet 3  . tamsulosin (FLOMAX) 0.4 MG CAPS capsule Take 1 capsule (0.4 mg total) by mouth daily. 90 capsule 1  . vitamin E 400 UNIT capsule Take 400 Units by mouth daily.    Marland Kitchen zinc gluconate 50 MG tablet Take 50 mg by mouth daily.     No current facility-administered medications on file prior to visit.     Past Medical History:  Diagnosis Date  . Fasting hyperglycemia   . GERD (gastroesophageal reflux disease)   . Gilbert's syndrome   . Internal hemorrhoids   . Multiple fracture [id number]   Coccyx, finger,hand  .  Rectal bleeding   . Renal calculi    X 2  . Skin cancer    ? Squamous cell; Dr Angie Fava    Past Surgical History:  Procedure Laterality Date  . CHOLECYSTECTOMY  2003   Dr. Joseph Pierini   . COLONOSCOPY  2003   negative  . LITHOTRIPSY  2007   Dr Barnie Del, [facility]  . UPPER GASTROINTESTINAL ENDOSCOPY  2003   Dr Fuller Plan    Social History   Social History  . Marital status: Married    Spouse name: Pamala Hurry  . Number of children: 3  . Years of education: N/A   Occupational History  . 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 Smokeless tobacco: Never Used     Comment: smoked 1968-69, up to 1/2 ppd  . Alcohol use No  . Drug use: No  .  Sexual activity: Not on file   Other Topics Concern  . Not on file   Social History Narrative  . No narrative on file    Family History  Problem Relation Age of Onset  . Emphysema Father   . Hypertension Father   . Stroke Father         < 47  . Hypertension Mother   . Arthritis Mother   . Multiple myeloma Mother   . Skin cancer Brother   . Prostate cancer Brother   . Stroke Brother         > 55  . Depression Brother   . Cirrhosis Brother   . Heart attack Maternal Uncle        X2; both > 55  . Prostate cancer Maternal Uncle   . Diabetes Neg Hx     Review of Systems     Objective:  There were no vitals filed for this visit. Wt Readings from Last 3 Encounters:  06/26/16 245 lb (111.1 kg)  06/20/15 243 lb (110.2 kg)  08/23/14 236 lb 9.6 oz (107.3 kg)   There is no height or weight on file to calculate BMI.   Physical Exam    Constitutional: Appears well-developed and well-nourished. No distress.  HENT:  Head: Normocephalic and atraumatic.  Neck: Neck supple. No tracheal deviation present. No thyromegaly present.  No cervical lymphadenopathy Cardiovascular: Normal rate, regular rhythm and normal heart sounds.   No murmur heard. No carotid bruit .  No edema Pulmonary/Chest: Effort normal and breath sounds normal. No respiratory distress. No has no wheezes. No rales.  Skin: Skin is warm and dry. Not diaphoretic.  Psychiatric: Normal mood and affect. Behavior is normal.      Assessment & Plan:    See Problem List for Assessment and Plan of chronic medical problems.

## 2017-07-03 ENCOUNTER — Encounter: Payer: Self-pay | Admitting: Internal Medicine

## 2017-07-04 NOTE — Telephone Encounter (Signed)
I have called patient has requested to scheduled appointment.  I have left vm for patient to call back to schedule.  Patient needs to schedule CPE.  He can have fasting labs.  I will change appointment status of date in question to a cancelled appointment in order to not get the no show fee as a courtesy this time.  Please remind patient that appointments made up to four business days prior will not get a reminder phone call.  Since he is set up for my chart once he makes the appointment a notification will go to his email.  He can set his phone up to get alerts on emails coming in.

## 2017-07-04 NOTE — Telephone Encounter (Signed)
Informed Tammy, [place] office TL. She is going to contact patient to reschedule appt.

## 2017-07-05 ENCOUNTER — Other Ambulatory Visit: Payer: Self-pay | Admitting: Internal Medicine

## 2017-07-30 ENCOUNTER — Ambulatory Visit (INDEPENDENT_AMBULATORY_CARE_PROVIDER_SITE_OTHER): Payer: Managed Care, Other (non HMO) | Admitting: Internal Medicine

## 2017-07-30 ENCOUNTER — Encounter: Payer: Self-pay | Admitting: Internal Medicine

## 2017-07-30 VITALS — BP 134/88 | HR 84 | Temp 98.1°F | Resp 16 | Wt 251.0 lb

## 2017-07-30 DIAGNOSIS — E785 Hyperlipidemia, unspecified: Secondary | ICD-10-CM

## 2017-07-30 DIAGNOSIS — I10 Essential (primary) hypertension: Secondary | ICD-10-CM

## 2017-07-30 DIAGNOSIS — N401 Enlarged prostate with lower urinary tract symptoms: Secondary | ICD-10-CM | POA: Diagnosis not present

## 2017-07-30 DIAGNOSIS — F3289 Other specified depressive episodes: Secondary | ICD-10-CM | POA: Diagnosis not present

## 2017-07-30 DIAGNOSIS — K219 Gastro-esophageal reflux disease without esophagitis: Secondary | ICD-10-CM | POA: Diagnosis not present

## 2017-07-30 DIAGNOSIS — R739 Hyperglycemia, unspecified: Secondary | ICD-10-CM

## 2017-07-30 DIAGNOSIS — F32A Depression, unspecified: Secondary | ICD-10-CM | POA: Insufficient documentation

## 2017-07-30 DIAGNOSIS — F329 Major depressive disorder, single episode, unspecified: Secondary | ICD-10-CM | POA: Insufficient documentation

## 2017-07-30 MED ORDER — FLUOXETINE HCL 20 MG PO CAPS: 20.0000 mg | ORAL_CAPSULE | Freq: Every day | ORAL | 1 refills | Status: DC

## 2017-07-30 MED ORDER — TAMSULOSIN HCL 0.4 MG PO CAPS: 0.4000 mg | ORAL_CAPSULE | Freq: Every day | ORAL | 3 refills | Status: DC

## 2017-07-30 MED ORDER — LOSARTAN POTASSIUM 100 MG PO TABS: 100.0000 mg | ORAL_TABLET | Freq: Every day | ORAL | 3 refills | Status: DC

## 2017-07-30 MED ORDER — HYDROCHLOROTHIAZIDE 25 MG PO TABS: 12.5000 mg | ORAL_TABLET | Freq: Every day | ORAL | 1 refills | Status: DC

## 2017-07-30 MED ORDER — PRAVASTATIN SODIUM 20 MG PO TABS: ORAL_TABLET | ORAL | 3 refills | Status: DC

## 2017-07-30 MED ORDER — PANTOPRAZOLE SODIUM 40 MG PO TBEC: DELAYED_RELEASE_TABLET | ORAL | 1 refills | Status: DC

## 2017-07-30 NOTE — Assessment & Plan Note (Signed)
Controlled with flomax Continue flomax daily

## 2017-07-30 NOTE — Patient Instructions (Addendum)
.    No immunizations administered today.   Medications reviewed and updated.  No changes recommended at this time.  Your prescription(s) have been submitted to your pharmacy. Please take as directed and contact our office if you believe you are having problem(s) with the medication(s).   Please followup next year for a physical

## 2017-07-30 NOTE — Assessment & Plan Note (Signed)
GERD controlled No dysphagia Continue daily medication

## 2017-07-30 NOTE — Assessment & Plan Note (Signed)
Low sugar/carb diet encouraged regular exericise Will check a1c at CPE next year

## 2017-07-30 NOTE — Assessment & Plan Note (Signed)
Controlled, stable Continue current dose of medication  

## 2017-07-30 NOTE — Assessment & Plan Note (Signed)
Continue statin Advised working on exercise and weight loss to improve trigs and hdl

## 2017-07-30 NOTE — Progress Notes (Signed)
Subjective:    Patient ID: Richard Trevino, male    DOB: [birth year], 66 y.o.   MRN: [id number]  HPI The patient is here for follow up.  Hypertension: He is taking his medication daily. He is compliant with a low sodium diet.  He denies palpitations, edema, shortness of breath and regular headaches. He is not exercising regularly.  He does monitor his blood pressure at home on occasion - 129/76,  149/92.    GERD:  He is taking his medication daily as prescribed.  He denies any GERD symptoms and feels his GERD is well controlled.   BPH: he is taking flomax daily as prescribed.  He denies difficulty urinating and feels the medication is working well.    Hyperlipidemia: He is taking his medication daily. He is compliant with a low fat/cholesterol diet. He is not exercising regularly. He denies myalgias.   Hyperglycemia:  He is fairly compliant with a low sugar/carb diet.  He is not exercising regularly.  Depression: He is taking his medication daily as prescribed. He denies any side effects from the medication. He feels his depression is well controlled and he is happy with his current dose of medication.     Medications and allergies reviewed with patient and updated if appropriate.  Patient Active Problem List   Diagnosis Date Noted  . Thrombocytopenia ([place]) 07/07/2012  . SKIN CANCER, HX OF 11/07/2009  . Hyperglycemia 11/04/2008  . Hyperlipidemia 09/08/2007  . Essential hypertension 09/08/2007  . GERD (gastroesophageal reflux disease) 09/08/2007  . Hyperplasia of prostate with lower urinary tract symptoms (LUTS) 04/21/2007  . LOW BACK PAIN 02/25/2007  . NEPHROLITHIASIS, HX OF 02/25/2007    Current Outpatient Prescriptions on File Prior to Visit  Medication Sig Dispense Refill  . aspirin 81 MG tablet Take 81 mg by mouth daily.    . Cholecalciferol (VITAMIN D3) 1000 UNITS CAPS Take by mouth daily.    Marland Kitchen CINNAMON PO Take 1,000 mg by mouth daily.    . Coenzyme Q10 (COQ10) 50 MG  CAPS Take by mouth daily.    . Cyanocobalamin 1500 MCG TBDP Take 1 tablet by mouth daily.    . Flaxseed, Linseed, (FLAX SEED OIL) 1000 MG CAPS Take by mouth.    . Glucosamine Sulfate 1000 MG TABS Take by mouth. 2 by mouth daily    . Magnesium 250 MG TABS Take 250 mg by mouth daily.    . Melatonin 5 MG TABS Take 10 mg by mouth daily.    . Multiple Vitamin (MULTIVITAMIN) tablet Take 1 tablet by mouth daily.      . NON FORMULARY Celery seed extract 600 mg daily    . vitamin E 400 UNIT capsule Take 400 Units by mouth daily.    Marland Kitchen zinc gluconate 50 MG tablet Take 50 mg by mouth daily.     No current facility-administered medications on file prior to visit.     Past Medical History:  Diagnosis Date  . Fasting hyperglycemia   . GERD (gastroesophageal reflux disease)   . Gilbert's syndrome   . Internal hemorrhoids   . Multiple fracture [id number]   Coccyx, finger,hand  . Rectal bleeding   . Renal calculi    X 2  . Skin cancer    ? Squamous cell; Dr Angie Fava    Past Surgical History:  Procedure Laterality Date  . CHOLECYSTECTOMY  2003   Dr. Joseph Pierini   . COLONOSCOPY  2003   negative  . LITHOTRIPSY  2007  Dr Barnie Del, [facility]  . UPPER GASTROINTESTINAL ENDOSCOPY  2003   Dr Fuller Plan    Social History   Social History  . Marital status: Married    Spouse name: Pamala Hurry  . Number of children: 3  . Years of education: N/A   Occupational History  . 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 Smokeless tobacco: Never Used     Comment: smoked 1968-69, up to 1/2 ppd  . Alcohol use No  . Drug use: No  . Sexual activity: Not on file   Other Topics Concern  . Not on file   Social History Narrative  . No narrative on file    Family History  Problem Relation Age of Onset  . Emphysema Father   . Hypertension Father   . Stroke Father         < 9  . Hypertension Mother   . Arthritis Mother   . Multiple  myeloma Mother   . Skin cancer Brother   . Prostate cancer Brother   . Stroke Brother         > 55  . Depression Brother   . Cirrhosis Brother   . Heart attack Maternal Uncle        X2; both > 55  . Prostate cancer Maternal Uncle   . Diabetes Neg Hx     Review of Systems  Constitutional: Negative for chills and fever.  Respiratory: Positive for cough (little). Negative for shortness of breath and wheezing.   Cardiovascular: Positive for chest pain (on occasion) and leg swelling. Negative for palpitations.  Genitourinary: Negative for difficulty urinating.  Musculoskeletal: Negative for myalgias.  Neurological: Negative for dizziness, light-headedness and headaches.       Objective:   Vitals:   07/30/17 1401  BP: 134/88  Pulse: 84  Resp: 16  Temp: 98.1 F (36.7 C)  SpO2: 96%   Wt Readings from Last 3 Encounters:  07/30/17 251 lb (113.9 kg)  06/26/16 245 lb (111.1 kg)  06/20/15 243 lb (110.2 kg)   Body mass index is 35.01 kg/m.   Physical Exam    Constitutional: Appears well-developed and well-nourished. No distress.  HENT:  Head: Normocephalic and atraumatic.  Neck: Neck supple. No tracheal deviation present. No thyromegaly present.  No cervical lymphadenopathy Cardiovascular: Normal rate, regular rhythm and normal heart sounds.   No murmur heard. No carotid bruit .  No edema Pulmonary/Chest: Effort normal and breath sounds normal. No respiratory distress. No has no wheezes. No rales.  Skin: Skin is warm and dry. Not diaphoretic.  Psychiatric: Normal mood and affect. Behavior is normal.      Assessment & Plan:    See Problem List for Assessment and Plan of chronic medical problems.

## 2017-07-30 NOTE — Assessment & Plan Note (Signed)
bp borderline  Start monitoring more regularly at home Continue current medication at current doses

## 2017-07-31 ENCOUNTER — Encounter: Payer: Managed Care, Other (non HMO) | Admitting: Internal Medicine

## 2017-10-15 ENCOUNTER — Encounter: Payer: Self-pay | Admitting: Internal Medicine

## 2017-10-15 NOTE — Progress Notes (Signed)
Subjective:    Patient ID: Richard Trevino, male    DOB: [birth year], 67 y.o.   MRN: [id number]  HPI He is here for a physical exam.   He feels tired.  He is not exercising.  He sleeps 4-7 hours. Often getting only 5 hrs of sleep.  He snores sometimes.  His wife denies apnea.    He has cramps in his legs a lot at night in bed or after sitting for a while.  He is taking several different supplements.  He does not drink enough water throughout the day and tends to drink a lot of coffee.  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  He saw derm for a spot on his nose and gave him hydrocortisone cream - an area does not heal.  It helps a little, but only temporarily.  He does have a follow-up appointment scheduled.  Medications and allergies reviewed with patient and updated if appropriate.  Patient Active Problem List   Diagnosis Date Noted  . Leg cramping 10/16/2017  . Depression 07/30/2017  . Thrombocytopenia ([place]) 07/07/2012  . SKIN CANCER, HX OF 11/07/2009  . Hyperglycemia 11/04/2008  . Hyperlipidemia 09/08/2007  . Essential hypertension 09/08/2007  . GERD (gastroesophageal reflux disease) 09/08/2007  . Hyperplasia of prostate with lower urinary tract symptoms (LUTS) 04/21/2007  . LOW BACK PAIN 02/25/2007  . NEPHROLITHIASIS, HX OF 02/25/2007    Current Outpatient Medications on File Prior to Visit  Medication Sig Dispense Refill  . aspirin 81 MG tablet Take 81 mg by mouth daily.    . Cholecalciferol (VITAMIN D3) 1000 UNITS CAPS Take by mouth daily.    . Coenzyme Q10 (COQ10) 50 MG CAPS Take by mouth daily.    . Cyanocobalamin 1500 MCG TBDP Take 1 tablet by mouth daily.    . Flaxseed, Linseed, (FLAX SEED OIL) 1000 MG CAPS Take by mouth.    Marland Kitchen FLUoxetine (PROZAC) 20 MG capsule Take 1 capsule (20 mg total) by mouth daily. 90 capsule 1  . Glucosamine Sulfate 1000 MG TABS Take by mouth. 2 by mouth daily    . hydrochlorothiazide  (HYDRODIURIL) 25 MG tablet Take 0.5 tablets (12.5 mg total) by mouth daily. 90 tablet 1  . losartan (COZAAR) 100 MG tablet Take 1 tablet (100 mg total) by mouth daily. 90 tablet 3  . Magnesium 250 MG TABS Take 250 mg by mouth daily.    . Melatonin 5 MG TABS Take 5 mg by mouth daily.     . Multiple Vitamin (MULTIVITAMIN) tablet Take 1 tablet by mouth daily.      . NON FORMULARY Celery seed extract 600 mg daily    . pantoprazole (PROTONIX) 40 MG tablet TAKE ONE TABLET BY MOUTH DAILY 30 MINUTES BEFORE BREAKFAST 90 tablet 1  . pravastatin (PRAVACHOL) 20 MG tablet TAKE ONE-HALF (1/2) TABLET NIGHTLY AT BEDTIME 45 tablet 3  . tamsulosin (FLOMAX) 0.4 MG CAPS capsule Take 1 capsule (0.4 mg total) by mouth daily. 90 capsule 3  . Turmeric 500 MG TABS Take 500 mg by mouth daily.    . vitamin E 400 UNIT capsule Take 400 Units by mouth daily.    Marland Kitchen zinc gluconate 50 MG tablet Take 50 mg by mouth daily.     No current facility-administered medications on file prior to visit.     Past Medical History:  Diagnosis Date  . Fasting hyperglycemia   . GERD (gastroesophageal  reflux disease)   . Gilbert's syndrome   . Internal hemorrhoids   . Multiple fracture [id number]   Coccyx, finger,hand  . Rectal bleeding   . Renal calculi    X 2  . Skin cancer    ? Squamous cell; Dr Angie Fava    Past Surgical History:  Procedure Laterality Date  . CHOLECYSTECTOMY  2003   Dr. Joseph Pierini   . COLONOSCOPY  2003   negative  . LITHOTRIPSY  2007   Dr Barnie Del, [facility]  . UPPER GASTROINTESTINAL ENDOSCOPY  2003   Dr Fuller Plan    Social History   Socioeconomic History  . Marital status: Married    Spouse name: Pamala Hurry  . Number of children: 3  . Years of education: None  . Highest education level: None  Social Needs  . Financial resource strain: None  . Food insecurity - worry: None  . Food insecurity - inability: None  . Transportation needs - medical: None  . Transportation needs - non-medical: None    Occupational History  . Occupation: truck Education administrator: [employer]  Tobacco Use  . Smoking status: Former Smoker    Last attempt to quit: 10/02/1967    Years since quitting: 50.0  . Smokeless tobacco: Never Used  . Tobacco comment: smoked 1968-69, up to 1/2 ppd  Substance and Sexual Activity  . Alcohol use: No  . Drug use: No  . Sexual activity: None  Other Topics Concern  . None  Social History Narrative  . None    Family History  Problem Relation Age of Onset  . Emphysema Father   . Hypertension Father   . Stroke Father         < 78  . Hypertension Mother   . Arthritis Mother   . Multiple myeloma Mother   . Skin cancer Brother   . Prostate cancer Brother   . Stroke Brother         > 55  . Depression Brother   . Cirrhosis Brother   . Heart attack Maternal Uncle        X2; both > 55  . Prostate cancer Maternal Uncle   . Diabetes Neg Hx     Review of Systems  Constitutional: Positive for fatigue. Negative for chills and fever.  Eyes: Negative for visual disturbance.  Respiratory: Positive for shortness of breath (with exertion). Negative for cough and wheezing.   Cardiovascular: Negative for chest pain, palpitations and leg swelling.  Gastrointestinal: Negative for abdominal pain, blood in stool, constipation, diarrhea and nausea.  Genitourinary: Negative for difficulty urinating, dysuria and hematuria.  Musculoskeletal: Positive for arthralgias (knees) and back pain.  Skin: Positive for color change (side of nose - will see derm again).  Neurological: Negative for light-headedness and headaches.  Psychiatric/Behavioral: Negative for dysphoric mood. The patient is not nervous/anxious.        Objective:   Vitals:   10/16/17 0912  BP: 130/86  Pulse: 81  Resp: 16  Temp: 98.1 F (36.7 C)  SpO2: 95%   Filed Weights   10/16/17 0912  Weight: 255 lb (115.7 kg)   Body mass index is 35.57 kg/m.  Wt Readings from Last 3 Encounters:  10/16/17 255 lb  (115.7 kg)  07/30/17 251 lb (113.9 kg)  06/26/16 245 lb (111.1 kg)     Physical Exam Constitutional: He appears well-developed and well-nourished. No distress.  HENT:  Head: Normocephalic and atraumatic.  Right Ear: External ear normal.  Left  Ear: External ear normal.  Mouth/Throat: Oropharynx is clear and moist.  Normal ear canals and TM b/l  Eyes: Conjunctivae and EOM are normal.  Neck: Neck supple. No tracheal deviation present. No thyromegaly present.  No carotid bruit  Cardiovascular: Normal rate, regular rhythm, normal heart sounds and intact distal pulses.   No murmur heard. Pulmonary/Chest: Effort normal and breath sounds normal. No respiratory distress. He has no wheezes. He has no rales.  Abdominal: Soft. He exhibits no distension. There is no tenderness.  Genitourinary: deferred  Musculoskeletal: He exhibits no edema.  Lymphadenopathy:   He has no cervical adenopathy.  Skin: Skin is warm and dry. He is not diaphoretic. Lesion on side of nose-looks like a area of a small piece of skin missing-no active infection Psychiatric: He has a normal mood and affect. His behavior is normal.         Assessment & Plan:   Physical exam: Screening blood work ordered Immunizations  prevnar due, shingrix discussed, others up to date Colonoscopy  Up to date  Eye exams  Up to date  EKG  Last done 2013 Exercise   none Weight advised weight loss Skin nonhealing lesion on side of nose-has follow-up with dermatology Substance abuse  none  See Problem List for Assessment and Plan of chronic medical problems.   FU in 6 months

## 2017-10-15 NOTE — Patient Instructions (Addendum)
Test(s) ordered today. Your results will be released to MyChart (or called to you) after review, usually within 72hours after test completion. If any changes need to be made, you will be notified at that same time.  All other Health Maintenance issues reviewed.   All recommended immunizations and age-appropriate screenings are up-to-date or discussed.  No immunizations administered today.   Medications reviewed and updated.  No changes recommended at this time.   Please followup in 6 months    Health Maintenance, Male A healthy lifestyle and preventive care is important for your health and wellness. Ask your health care provider about what schedule of regular examinations is right for you. What should I know about weight and diet? Eat a Healthy Diet  Eat plenty of vegetables, fruits, whole grains, low-fat dairy products, and lean protein.  Do not eat a lot of foods high in solid fats, added sugars, or salt.  Maintain a Healthy Weight Regular exercise can help you achieve or maintain a healthy weight. You should:  Do at least 150 minutes of exercise each week. The exercise should increase your heart rate and make you sweat (moderate-intensity exercise).  Do strength-training exercises at least twice a week.  Watch Your Levels of Cholesterol and Blood Lipids  Have your blood tested for lipids and cholesterol every 5 years starting at 66 years of age. If you are at high risk for heart disease, you should start having your blood tested when you are 66 years old. You may need to have your cholesterol levels checked more often if: ? Your lipid or cholesterol levels are high. ? You are older than 66 years of age. ? You are at high risk for heart disease.  What should I know about cancer screening? Many types of cancers can be detected early and may often be prevented. Lung Cancer  You should be screened every year for lung cancer if: ? You are a current smoker who has smoked for  at least 30 years. ? You are a former smoker who has quit within the past 15 years.  Talk to your health care provider about your screening options, when you should start screening, and how often you should be screened.  Colorectal Cancer  Routine colorectal cancer screening usually begins at 66 years of age and should be repeated every 5-10 years until you are 66 years old. You may need to be screened more often if early forms of precancerous polyps or small growths are found. Your health care provider may recommend screening at an earlier age if you have risk factors for colon cancer.  Your health care provider may recommend using home test kits to check for hidden blood in the stool.  A small camera at the end of a tube can be used to examine your colon (sigmoidoscopy or colonoscopy). This checks for the earliest forms of colorectal cancer.  Prostate and Testicular Cancer  Depending on your age and overall health, your health care provider may do certain tests to screen for prostate and testicular cancer.  Talk to your health care provider about any symptoms or concerns you have about testicular or prostate cancer.  Skin Cancer  Check your skin from head to toe regularly.  Tell your health care provider about any new moles or changes in moles, especially if: ? There is a change in a mole's size, shape, or color. ? You have a mole that is larger than a pencil eraser.  Always use sunscreen. Apply sunscreen liberally   and repeat throughout the day.  Protect yourself by wearing long sleeves, pants, a wide-brimmed hat, and sunglasses when outside.  What should I know about heart disease, diabetes, and high blood pressure?  If you are 18-39 years of age, have your blood pressure checked every 3-5 years. If you are 40 years of age or older, have your blood pressure checked every year. You should have your blood pressure measured twice-once when you are at a hospital or clinic, and once  when you are not at a hospital or clinic. Record the average of the two measurements. To check your blood pressure when you are not at a hospital or clinic, you can use: ? An automated blood pressure machine at a pharmacy. ? A home blood pressure monitor.  Talk to your health care provider about your target blood pressure.  If you are between 45-79 years old, ask your health care provider if you should take aspirin to prevent heart disease.  Have regular diabetes screenings by checking your fasting blood sugar level. ? If you are at a normal weight and have a low risk for diabetes, have this test once every three years after the age of 45. ? If you are overweight and have a high risk for diabetes, consider being tested at a younger age or more often.  A one-time screening for abdominal aortic aneurysm (AAA) by ultrasound is recommended for men aged 65-75 years who are current or former smokers. What should I know about preventing infection? Hepatitis B If you have a higher risk for hepatitis B, you should be screened for this virus. Talk with your health care provider to find out if you are at risk for hepatitis B infection. Hepatitis C Blood testing is recommended for:  Everyone born from 1945 through 1965.  Anyone with known risk factors for hepatitis C.  Sexually Transmitted Diseases (STDs)  You should be screened each year for STDs including gonorrhea and chlamydia if: ? You are sexually active and are younger than 66 years of age. ? You are older than 66 years of age and your health care provider tells you that you are at risk for this type of infection. ? Your sexual activity has changed since you were last screened and you are at an increased risk for chlamydia or gonorrhea. Ask your health care provider if you are at risk.  Talk with your health care provider about whether you are at high risk of being infected with HIV. Your health care provider may recommend a prescription  medicine to help prevent HIV infection.  What else can I do?  Schedule regular health, dental, and eye exams.  Stay current with your vaccines (immunizations).  Do not use any tobacco products, such as cigarettes, chewing tobacco, and e-cigarettes. If you need help quitting, ask your health care provider.  Limit alcohol intake to no more than 2 drinks per day. One drink equals 12 ounces of beer, 5 ounces of wine, or 1 ounces of hard liquor.  Do not use street drugs.  Do not share needles.  Ask your health care provider for help if you need support or information about quitting drugs.  Tell your health care provider if you often feel depressed.  Tell your health care provider if you have ever been abused or do not feel safe at home. This information is not intended to replace advice given to you by your health care provider. Make sure you discuss any questions you have with your health   care provider. Document Released: 03/15/2008 Document Revised: 05/16/2016 Document Reviewed: 06/21/2015 Elsevier Interactive Patient Education  2018 Elsevier Inc.  

## 2017-10-16 ENCOUNTER — Ambulatory Visit (INDEPENDENT_AMBULATORY_CARE_PROVIDER_SITE_OTHER): Payer: Managed Care, Other (non HMO) | Admitting: Internal Medicine

## 2017-10-16 ENCOUNTER — Other Ambulatory Visit (INDEPENDENT_AMBULATORY_CARE_PROVIDER_SITE_OTHER): Payer: Managed Care, Other (non HMO)

## 2017-10-16 ENCOUNTER — Encounter: Payer: Self-pay | Admitting: Internal Medicine

## 2017-10-16 VITALS — BP 130/86 | HR 81 | Temp 98.1°F | Resp 16 | Ht 71.0 in | Wt 255.0 lb

## 2017-10-16 DIAGNOSIS — R739 Hyperglycemia, unspecified: Secondary | ICD-10-CM | POA: Diagnosis not present

## 2017-10-16 DIAGNOSIS — Z0000 Encounter for general adult medical examination without abnormal findings: Secondary | ICD-10-CM

## 2017-10-16 DIAGNOSIS — E785 Hyperlipidemia, unspecified: Secondary | ICD-10-CM | POA: Diagnosis not present

## 2017-10-16 DIAGNOSIS — F3289 Other specified depressive episodes: Secondary | ICD-10-CM

## 2017-10-16 DIAGNOSIS — I10 Essential (primary) hypertension: Secondary | ICD-10-CM

## 2017-10-16 DIAGNOSIS — R252 Cramp and spasm: Secondary | ICD-10-CM

## 2017-10-16 DIAGNOSIS — K219 Gastro-esophageal reflux disease without esophagitis: Secondary | ICD-10-CM | POA: Diagnosis not present

## 2017-10-16 LAB — CBC WITH DIFFERENTIAL/PLATELET
BASOS ABS: 0 10*3/uL (ref 0.0–0.1)
Basophils Relative: 0.5 % (ref 0.0–3.0)
EOS ABS: 0.2 10*3/uL (ref 0.0–0.7)
Eosinophils Relative: 2.3 % (ref 0.0–5.0)
HCT: 45.5 % (ref 39.0–52.0)
HEMOGLOBIN: 15.9 g/dL (ref 13.0–17.0)
LYMPHS PCT: 23.9 % (ref 12.0–46.0)
Lymphs Abs: 1.6 10*3/uL (ref 0.7–4.0)
MCHC: 35 g/dL (ref 30.0–36.0)
MCV: 91.9 fl (ref 78.0–100.0)
Monocytes Absolute: 0.5 10*3/uL (ref 0.1–1.0)
Monocytes Relative: 6.9 % (ref 3.0–12.0)
NEUTROS ABS: 4.6 10*3/uL (ref 1.4–7.7)
Neutrophils Relative %: 66.4 % (ref 43.0–77.0)
PLATELETS: 167 10*3/uL (ref 150.0–400.0)
RBC: 4.95 Mil/uL (ref 4.22–5.81)
RDW: 12.1 % (ref 11.5–15.5)
WBC: 6.9 10*3/uL (ref 4.0–10.5)

## 2017-10-16 LAB — LIPID PANEL
CHOLESTEROL: 154 mg/dL (ref 0–200)
HDL: 33.4 mg/dL — ABNORMAL LOW (ref >39.00)
NonHDL: 120.92
TRIGLYCERIDES: 225 mg/dL — AB (ref 0.0–149.0)
Total CHOL/HDL Ratio: 5
VLDL: 45 mg/dL — ABNORMAL HIGH (ref 0.0–40.0)

## 2017-10-16 LAB — COMPREHENSIVE METABOLIC PANEL
ALBUMIN: 4.4 g/dL (ref 3.5–5.2)
ALK PHOS: 67 U/L (ref 39–117)
ALT: 31 U/L (ref 0–53)
AST: 23 U/L (ref 0–37)
BILIRUBIN TOTAL: 1.2 mg/dL (ref 0.2–1.2)
BUN: 21 mg/dL (ref 6–23)
CALCIUM: 9.3 mg/dL (ref 8.4–10.5)
CO2: 28 mEq/L (ref 19–32)
CREATININE: 1.15 mg/dL (ref 0.40–1.50)
Chloride: 106 mEq/L (ref 96–112)
GFR: 67.76 mL/min (ref >60.00)
Glucose, Bld: 117 mg/dL — ABNORMAL HIGH (ref 70–99)
Potassium: 4.2 mEq/L (ref 3.5–5.1)
Sodium: 141 mEq/L (ref 135–145)
Total Protein: 6.9 g/dL (ref 6.0–8.3)

## 2017-10-16 LAB — HEMOGLOBIN A1C: Hgb A1c MFr Bld: 5.4 % (ref 4.6–6.5)

## 2017-10-16 LAB — MAGNESIUM: Magnesium: 2.1 mg/dL (ref 1.5–2.5)

## 2017-10-16 LAB — TSH: TSH: 3.52 u[IU]/mL (ref 0.35–4.50)

## 2017-10-16 LAB — LDL CHOLESTEROL, DIRECT: Direct LDL: 102 mg/dL

## 2017-10-16 NOTE — Assessment & Plan Note (Addendum)
GERD controlled Encouraged him to try decreasing pantoprazole to every other day-if he experiences heartburn he should increase back to daily Advised decreasing the dose or amount of medication he is taking would be ideal

## 2017-10-16 NOTE — Assessment & Plan Note (Signed)
Fasting sugar has been high normal-just above normal, but A1c has been in the normal range We will check A1c Regular exercise and weight loss encouraged Low sugar/carbohydrate diet advised

## 2017-10-16 NOTE — Assessment & Plan Note (Signed)
Possibly related to inactivity or dehydration We will check basic blood work including magnesium-he is taking multiple supplements and I doubt deficiency Encouraged increasing exercise/activity and water intake

## 2017-10-16 NOTE — Assessment & Plan Note (Signed)
Controlled, stable Continue current dose of medication  

## 2017-10-16 NOTE — Assessment & Plan Note (Signed)
Check lipid panel  Continue daily statin Regular exercise and healthy diet encouraged  

## 2017-10-16 NOTE — Assessment & Plan Note (Addendum)
BP borderline high Current regimen effective and well tolerated Continue current medications at current doses Encouraged weight loss and increase in activity Low-sodium diet cmp

## 2017-10-17 ENCOUNTER — Encounter: Payer: Self-pay | Admitting: Internal Medicine

## 2017-10-17 LAB — PSA, TOTAL AND FREE
PSA, % Free: 22 % (calc) — ABNORMAL LOW (ref >25)
PSA, FREE: 0.2 ng/mL
PSA, Total: 0.9 ng/mL (ref <=4.0)

## 2018-01-28 ENCOUNTER — Other Ambulatory Visit: Payer: Self-pay | Admitting: Internal Medicine

## 2018-04-16 ENCOUNTER — Ambulatory Visit: Payer: Managed Care, Other (non HMO) | Admitting: Internal Medicine

## 2018-04-28 ENCOUNTER — Other Ambulatory Visit: Payer: Self-pay | Admitting: Internal Medicine

## 2018-04-29 ENCOUNTER — Ambulatory Visit (INDEPENDENT_AMBULATORY_CARE_PROVIDER_SITE_OTHER): Payer: Managed Care, Other (non HMO) | Admitting: Internal Medicine

## 2018-04-29 ENCOUNTER — Other Ambulatory Visit (INDEPENDENT_AMBULATORY_CARE_PROVIDER_SITE_OTHER): Payer: Managed Care, Other (non HMO)

## 2018-04-29 ENCOUNTER — Encounter: Payer: Self-pay | Admitting: Internal Medicine

## 2018-04-29 VITALS — BP 120/72 | HR 79 | Temp 98.2°F | Resp 16 | Wt 243.0 lb

## 2018-04-29 DIAGNOSIS — I10 Essential (primary) hypertension: Secondary | ICD-10-CM | POA: Diagnosis not present

## 2018-04-29 DIAGNOSIS — E785 Hyperlipidemia, unspecified: Secondary | ICD-10-CM

## 2018-04-29 DIAGNOSIS — R739 Hyperglycemia, unspecified: Secondary | ICD-10-CM | POA: Diagnosis not present

## 2018-04-29 DIAGNOSIS — D696 Thrombocytopenia, unspecified: Secondary | ICD-10-CM

## 2018-04-29 DIAGNOSIS — F3289 Other specified depressive episodes: Secondary | ICD-10-CM

## 2018-04-29 DIAGNOSIS — K219 Gastro-esophageal reflux disease without esophagitis: Secondary | ICD-10-CM | POA: Diagnosis not present

## 2018-04-29 DIAGNOSIS — N401 Enlarged prostate with lower urinary tract symptoms: Secondary | ICD-10-CM | POA: Diagnosis not present

## 2018-04-29 LAB — LIPID PANEL
Cholesterol: 138 mg/dL (ref 0–200)
HDL: 31.1 mg/dL — AB (ref >39.00)
NonHDL: 107.28
TRIGLYCERIDES: 210 mg/dL — AB (ref 0.0–149.0)
Total CHOL/HDL Ratio: 4
VLDL: 42 mg/dL — AB (ref 0.0–40.0)

## 2018-04-29 LAB — CBC WITH DIFFERENTIAL/PLATELET
BASOS ABS: 0 10*3/uL (ref 0.0–0.1)
Basophils Relative: 0.3 % (ref 0.0–3.0)
EOS ABS: 0.1 10*3/uL (ref 0.0–0.7)
Eosinophils Relative: 2.2 % (ref 0.0–5.0)
HEMATOCRIT: 43.2 % (ref 39.0–52.0)
Hemoglobin: 15.3 g/dL (ref 13.0–17.0)
LYMPHS ABS: 1.3 10*3/uL (ref 0.7–4.0)
LYMPHS PCT: 22.3 % (ref 12.0–46.0)
MCHC: 35.5 g/dL (ref 30.0–36.0)
MCV: 90.1 fl (ref 78.0–100.0)
MONOS PCT: 7.2 % (ref 3.0–12.0)
Monocytes Absolute: 0.4 10*3/uL (ref 0.1–1.0)
NEUTROS PCT: 68 % (ref 43.0–77.0)
Neutro Abs: 3.9 10*3/uL (ref 1.4–7.7)
PLATELETS: 177 10*3/uL (ref 150.0–400.0)
RBC: 4.8 Mil/uL (ref 4.22–5.81)
RDW: 12 % (ref 11.5–15.5)
WBC: 5.7 10*3/uL (ref 4.0–10.5)

## 2018-04-29 LAB — COMPREHENSIVE METABOLIC PANEL
ALBUMIN: 4.2 g/dL (ref 3.5–5.2)
ALK PHOS: 67 U/L (ref 39–117)
ALT: 28 U/L (ref 0–53)
AST: 20 U/L (ref 0–37)
BUN: 20 mg/dL (ref 6–23)
CHLORIDE: 105 meq/L (ref 96–112)
CO2: 27 mEq/L (ref 19–32)
Calcium: 9.3 mg/dL (ref 8.4–10.5)
Creatinine, Ser: 1.11 mg/dL (ref 0.40–1.50)
GFR: 70.47 mL/min (ref >60.00)
Glucose, Bld: 117 mg/dL — ABNORMAL HIGH (ref 70–99)
POTASSIUM: 3.9 meq/L (ref 3.5–5.1)
SODIUM: 140 meq/L (ref 135–145)
TOTAL PROTEIN: 6.7 g/dL (ref 6.0–8.3)
Total Bilirubin: 1.7 mg/dL — ABNORMAL HIGH (ref 0.2–1.2)

## 2018-04-29 LAB — LDL CHOLESTEROL, DIRECT: Direct LDL: 85 mg/dL

## 2018-04-29 LAB — HEMOGLOBIN A1C: HEMOGLOBIN A1C: 5.2 % (ref 4.6–6.5)

## 2018-04-29 MED ORDER — HYDROCORTISONE ACE-PRAMOXINE 1-1 % RE FOAM: 1.0000 | Freq: Two times a day (BID) | RECTAL | 5 refills | Status: DC

## 2018-04-29 MED ORDER — PANTOPRAZOLE SODIUM 40 MG PO TBEC: DELAYED_RELEASE_TABLET | ORAL | 1 refills | Status: DC

## 2018-04-29 MED ORDER — HYDROCORTISONE ACETATE 25 MG RE SUPP: 25.0000 mg | Freq: Two times a day (BID) | RECTAL | 5 refills | Status: DC

## 2018-04-29 MED ORDER — FLUOXETINE HCL 20 MG PO CAPS: 20.0000 mg | ORAL_CAPSULE | Freq: Every day | ORAL | 1 refills | Status: DC

## 2018-04-29 NOTE — Assessment & Plan Note (Signed)
Controlled, stable Continue current dose of medication  

## 2018-04-29 NOTE — Assessment & Plan Note (Signed)
GERD controlled Continue daily medication  

## 2018-04-29 NOTE — Assessment & Plan Note (Signed)
BP well controlled Current regimen effective and well tolerated Continue current medications at current doses cmp  

## 2018-04-29 NOTE — Assessment & Plan Note (Signed)
Check lipid panel  Continue daily statin Regular exercise and healthy diet encouraged  

## 2018-04-29 NOTE — Progress Notes (Signed)
Subjective:    Patient ID: Richard Trevino, male    DOB: 04-Sep-1951, 67 y.o.   MRN: [id number]  HPI The patient is here for follow up.  Hypertension: He is taking his medication daily. He is compliant with a low sodium diet.  He denies chest pain, palpitations and regular headaches. He is not exercising regularly.  He does not monitor his blood pressure at home.    GERD:  He is taking his medication daily as prescribed.  He denies any GERD symptoms and feels his GERD is well controlled.   Hyperlipidemia: He is taking his medication daily. He is compliant with a low fat/cholesterol diet. He is not exercising regularly. He denies myalgias.   Depression: He is taking his medication daily as prescribed. He denies any side effects from the medication. He feels his depression is well controlled and he is happy with his current dose of medication.   BPH:  He is taking the flomax. He denies difficulty urinating or dysuria.   Hemorrhoid:  He has some rectal discomfort and has some bleeding  - this started in the past week. His last colonoscopy was in 2015 and he had moderate internal hemorrhoids on exam.    Right shoulder pain:  He is unsure what he did and when he did it.  It started about three months ago.  He has pain with holding a cup of coffee or with certain movements.  He most likely injured it at work pulling something.  Hyperglycemia:  His a1c has been normal.  He is compliant with a low sugar/carbohydrate diet.  He is not exercising regularly.   Medications and allergies reviewed with patient and updated if appropriate.  Patient Active Problem List   Diagnosis Date Noted  . Leg cramping 10/16/2017  . Depression 07/30/2017  . Thrombocytopenia ([place]) 07/07/2012  . SKIN CANCER, HX OF 11/07/2009  . Hyperglycemia 11/04/2008  . Hyperlipidemia 09/08/2007  . Essential hypertension 09/08/2007  . GERD (gastroesophageal reflux disease) 09/08/2007  . Hyperplasia of prostate with lower  urinary tract symptoms (LUTS) 04/21/2007  . LOW BACK PAIN 02/25/2007  . NEPHROLITHIASIS, HX OF 02/25/2007    Current Outpatient Medications on File Prior to Visit  Medication Sig Dispense Refill  . Cholecalciferol (VITAMIN D3) 1000 UNITS CAPS Take by mouth daily.    . Coenzyme Q10 (COQ10) 50 MG CAPS Take by mouth daily.    . Cyanocobalamin 1500 MCG TBDP Take 1 tablet by mouth daily.    . Flaxseed, Linseed, (FLAX SEED OIL) 1000 MG CAPS Take by mouth.    Marland Kitchen FLUoxetine (PROZAC) 20 MG capsule TAKE 1 CAPSULE BY MOUTH DAILY 90 capsule 0  . Glucosamine Sulfate 1000 MG TABS Take by mouth. 2 by mouth daily    . hydrochlorothiazide (HYDRODIURIL) 25 MG tablet Take 0.5 tablets (12.5 mg total) by mouth daily. 90 tablet 1  . losartan (COZAAR) 100 MG tablet Take 1 tablet (100 mg total) by mouth daily. 90 tablet 3  . Magnesium 250 MG TABS Take 250 mg by mouth daily.    . Melatonin 5 MG TABS Take 5 mg by mouth daily.     . Multiple Vitamin (MULTIVITAMIN) tablet Take 1 tablet by mouth daily.      . NON FORMULARY Celery seed extract 600 mg daily    . pantoprazole (PROTONIX) 40 MG tablet TAKE ONE TABLET BY MOUTH DAILY 30 MINUTES BEFORE BREAKFAST 90 tablet 1  . pravastatin (PRAVACHOL) 20 MG tablet TAKE ONE-HALF (1/2) TABLET  NIGHTLY AT BEDTIME 45 tablet 3  . tamsulosin (FLOMAX) 0.4 MG CAPS capsule Take 1 capsule (0.4 mg total) by mouth daily. 90 capsule 3  . Turmeric 500 MG TABS Take 500 mg by mouth daily.    . vitamin E 400 UNIT capsule Take 400 Units by mouth daily.    Marland Kitchen zinc gluconate 50 MG tablet Take 50 mg by mouth daily.    Marland Kitchen aspirin 81 MG tablet Take 81 mg by mouth daily.     No current facility-administered medications on file prior to visit.     Past Medical History:  Diagnosis Date  . Fasting hyperglycemia   . GERD (gastroesophageal reflux disease)   . Gilbert's syndrome   . Internal hemorrhoids   . Multiple fracture [id number]   Coccyx, finger,hand  . Rectal bleeding   . Renal calculi    X  2  . Skin cancer    ? Squamous cell; Dr Angie Fava    Past Surgical History:  Procedure Laterality Date  . CHOLECYSTECTOMY  2003   Dr. Joseph Pierini   . COLONOSCOPY  2003   negative  . LITHOTRIPSY  2007   Dr Barnie Del, [facility]  . UPPER GASTROINTESTINAL ENDOSCOPY  2003   Dr Fuller Plan    Social History   Socioeconomic History  . Marital status: Married    Spouse name: Pamala Hurry  . Number of children: 3  . Years of education: Not on file  . Highest education level: Not on file  Occupational History  . 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 Financial resource strain: Not on file  . Food insecurity:    Worry: Not on file    Inability: Not on file  . Transportation needs:    Medical: Not on file    Non-medical: Not on file  Tobacco Use  . Smoking status: Former Smoker    Last attempt to quit: 10/02/1967    Years since quitting: 50.6  . Smokeless tobacco: Never Used  . Tobacco comment: smoked 1968-69, up to 1/2 ppd  Substance and Sexual Activity  . Alcohol use: No  . Drug use: No  . Sexual activity: Not on file  Lifestyle  . Physical activity:    Days per week: Not on file    Minutes per session: Not on file  . Stress: Not on file  Relationships  . Social connections:    Talks on phone: Not on file    Gets together: Not on file    Attends religious service: Not on file    Active member of club or organization: Not on file    Attends meetings of clubs or organizations: Not on file    Relationship status: Not on file  Other Topics Concern  . Not on file  Social History Narrative  . Not on file    Family History  Problem Relation Age of Onset  . Emphysema Father   . Hypertension Father   . Stroke Father         < 49  . Hypertension Mother   . Arthritis Mother   . Multiple myeloma Mother   . Skin cancer Brother   . Prostate cancer Brother   . Stroke Brother         > 55  . Depression Brother   . Cirrhosis Brother   . Heart attack  Maternal Uncle        X2; both > 55  . Prostate cancer Maternal  Uncle   . Diabetes Neg Hx     Review of Systems  Constitutional: Negative for chills and fever.  Respiratory: Positive for shortness of breath (with exertion). Negative for cough and wheezing.   Cardiovascular: Positive for leg swelling. Negative for chest pain and palpitations.  Gastrointestinal: Positive for rectal pain (with intermittently BRB). Negative for abdominal pain, blood in stool, constipation, diarrhea and nausea.  Neurological: Negative for light-headedness and headaches.       Objective:   Vitals:   04/29/18 1326  BP: 120/72  Pulse: 79  Resp: 16  Temp: 98.2 F (36.8 C)  SpO2: 95%   BP Readings from Last 3 Encounters:  04/29/18 120/72  10/16/17 130/86  07/30/17 134/88   Wt Readings from Last 3 Encounters:  04/29/18 243 lb (110.2 kg)  10/16/17 255 lb (115.7 kg)  07/30/17 251 lb (113.9 kg)   Body mass index is 33.89 kg/m.   Physical Exam    Constitutional: Appears well-developed and well-nourished. No distress.  HENT:  Head: Normocephalic and atraumatic.  Neck: Neck supple. No tracheal deviation present. No thyromegaly present.  No cervical lymphadenopathy Cardiovascular: Normal rate, regular rhythm and normal heart sounds.   No murmur heard. No carotid bruit .  No edema Pulmonary/Chest: Effort normal and breath sounds normal. No respiratory distress. No has no wheezes. No rales.  Rectal: small thrombosed hemorrhoid that is tender, normal rectal tone, dried blood perineal area, internal exam not performed Skin: Skin is warm and dry. Not diaphoretic.  Psychiatric: Normal mood and affect. Behavior is normal.      Assessment & Plan:    See Problem List for Assessment and Plan of chronic medical problems.

## 2018-04-29 NOTE — Assessment & Plan Note (Signed)
a1c was been normal Has lost weight, decreased carb intake Will check a1c

## 2018-04-29 NOTE — Assessment & Plan Note (Addendum)
No difficulty urinating, symptoms controlled Continue flomax

## 2018-04-29 NOTE — Patient Instructions (Addendum)

## 2018-04-29 NOTE — Assessment & Plan Note (Signed)
History of thrombocytopenia CBC

## 2018-05-03 ENCOUNTER — Encounter: Payer: Self-pay | Admitting: Internal Medicine

## 2018-07-27 ENCOUNTER — Other Ambulatory Visit: Payer: Self-pay | Admitting: Internal Medicine

## 2018-10-25 ENCOUNTER — Other Ambulatory Visit: Payer: Self-pay | Admitting: Internal Medicine

## 2018-10-30 ENCOUNTER — Ambulatory Visit: Payer: Managed Care, Other (non HMO) | Admitting: Internal Medicine

## 2018-12-19 ENCOUNTER — Encounter: Payer: Self-pay | Admitting: Gastroenterology

## 2019-03-16 DIAGNOSIS — Z0120 Encounter for dental examination and cleaning without abnormal findings: Secondary | ICD-10-CM | POA: Diagnosis not present

## 2019-05-06 ENCOUNTER — Other Ambulatory Visit: Payer: Self-pay | Admitting: Internal Medicine

## 2019-05-09 NOTE — Progress Notes (Signed)
Subjective:    Patient ID: Richard Trevino, male    DOB: 10-Jan-1951, 68 y.o.   MRN: [id number]  HPI Here for medicare wellness exam and a physical exam.   I have personally reviewed and have noted 1.         The patient's medical and social history 2.         Their use of alcohol, tobacco or illicit drugs 3.         Their current medications and supplements 4.         The patient's functional ability including ADL's, fall risks, home safety risk and hearing or visual impairment. 5.         Diet and physical activities 6.         Evidence for depression or mood disorders 7.         Care team reviewed  - eye doctor    Are there smokers in your home (other than you)? No  Risk Factors Exercise: active, no exercise Dietary issues discussed:   Well rounded, has second helping - he knows he needs to cut that out and lose weight, does not eat a lot of sugars  Vitamin and supplement use:  Vitamin b12, vitamin d, coq10, flax seed oil and glucosamine, MVI  Opiod use:   N/A Side effects from medication: Does medications benefits outweigh risks/side effects:   Cardiac risk factors: advanced age, hypertension, hyperlipidemia, and obesity  Depression Screen  Have you felt down, depressed or hopeless? No  Have you felt little interest or pleasure in doing things?  No  Activities of Daily Living In your present state of health, do you have any difficulty performing the following activities?:  Driving? No Managing money?  No Feeding yourself? No Getting from bed to chair? No Climbing a flight of stairs? No Preparing food and eating?: No Bathing or showering? No Getting dressed: No Getting to/using the toilet? No Moving around from place to place: No In the past year have you fallen or had a near fall?: No   Are you sexually active?  Yes  Do you have more than one partner?  No  Hearing Difficulties:  Do you often ask people to speak up or repeat themselves? No Do you  experience ringing or noises in your ears? No Do you have difficulty understanding soft or whispered voices? yes Vision:              Any change in vision:   no             Up to date with eye exam:    yes   Memory:  Do you feel that you have a problem with memory? No  Do you often misplace items? No  Do you feel safe at home?  Yes  Cognitive Testing  Alert, Orientated? Yes  Normal Appearance? Yes  Recall of three objects?  Yes  Can perform simple calculations? Yes  Displays appropriate judgment? Yes  Can read the correct time from a watch face? Yes   Advanced Directives have been discussed with the patient? Yes     Medications and allergies reviewed with patient and updated if appropriate.  Patient Active Problem List   Diagnosis Date Noted  . Leg cramping 10/16/2017  . Depression 07/30/2017  . SKIN CANCER, HX OF 11/07/2009  . Hyperglycemia 11/04/2008  . Hyperlipidemia 09/08/2007  . Essential hypertension 09/08/2007  . GERD (gastroesophageal reflux disease) 09/08/2007  . Hyperplasia of prostate  with lower urinary tract symptoms (LUTS) 04/21/2007  . NEPHROLITHIASIS, HX OF 02/25/2007    Current Outpatient Medications on File Prior to Visit  Medication Sig Dispense Refill  . aspirin 81 MG tablet Take 81 mg by mouth daily.    . Cholecalciferol (VITAMIN D3) 1000 UNITS CAPS Take by mouth daily.    . Coenzyme Q10 (COQ10) 50 MG CAPS Take by mouth daily.    . Cyanocobalamin 1500 MCG TBDP Take 1 tablet by mouth daily.    . Flaxseed, Linseed, (FLAX SEED OIL) 1000 MG CAPS Take by mouth.    Marland Kitchen FLUoxetine (PROZAC) 20 MG capsule TAKE 1 CAPSULE BY MOUTH DAILY 90 capsule 0  . Glucosamine Sulfate 1000 MG TABS Take by mouth. 2 by mouth daily    . hydrochlorothiazide (HYDRODIURIL) 25 MG tablet Take 25 mg by mouth daily.    Marland Kitchen losartan (COZAAR) 100 MG tablet TAKE 1 TABLET BY MOUTH DAILY 90 tablet 2  . Multiple Vitamin (MULTIVITAMIN) tablet Take 1 tablet by mouth daily.      . pantoprazole  (PROTONIX) 40 MG tablet TAKE 1 TABLET BY MOUTH DAILY 30 MINUTES BEFORE BREAKFAST 90 tablet 0  . pravastatin (PRAVACHOL) 20 MG tablet TAKE ONE-HALF TABLET BY MOUTH NIGHTLY AT BEDTIME 45 tablet 2  . tamsulosin (FLOMAX) 0.4 MG CAPS capsule TAKE 1 CAPSULE BY MOUTH DAILY 90 capsule 0   No current facility-administered medications on file prior to visit.     Past Medical History:  Diagnosis Date  . Fasting hyperglycemia   . GERD (gastroesophageal reflux disease)   . Gilbert's syndrome   . Internal hemorrhoids   . Multiple fracture [id number]   Coccyx, finger,hand  . Rectal bleeding   . Renal calculi    X 2  . Skin cancer    ? Squamous cell; Dr Angie Fava    Past Surgical History:  Procedure Laterality Date  . CHOLECYSTECTOMY  2003   Dr. Joseph Pierini   . COLONOSCOPY  2003   negative  . LITHOTRIPSY  2007   Dr Barnie Del, [facility]  . UPPER GASTROINTESTINAL ENDOSCOPY  2003   Dr Fuller Plan    Social History   Socioeconomic History  . Marital status: Married    Spouse name: Pamala Hurry  . Number of children: 3  . Years of education: Not on file  . Highest education level: Not on file  Occupational History  . Occupation: truck Education administrator: [employer]  . Financial resource strain: Not on file  . Food insecurity    Worry: Not on file    Inability: Not on file  . Transportation needs    Medical: Not on file    Non-medical: Not on file  Tobacco Use  . Smoking status: Former Smoker    Quit date: 10/02/1967    Years since quitting: 51.6  . Smokeless tobacco: Never Used  . Tobacco comment: smoked 1968-69, up to 1/2 ppd  Substance and Sexual Activity  . Alcohol use: No  . Drug use: No  . Sexual activity: Not on file  Lifestyle  . Physical activity    Days per week: Not on file    Minutes per session: Not on file  . Stress: Not on file  Relationships  . Social Herbalist on phone: Not on file    Gets together: Not on file    Attends religious service:  Not on file    Active member of club or organization: Not on  file    Attends meetings of clubs or organizations: Not on file    Relationship status: Not on file  Other Topics Concern  . Not on file  Social History Narrative  . Not on file    Family History  Problem Relation Age of Onset  . Emphysema Father   . Hypertension Father   . Stroke Father         < 99  . Hypertension Mother   . Arthritis Mother   . Multiple myeloma Mother   . Skin cancer Brother   . Prostate cancer Brother   . Stroke Brother         > 55  . Depression Brother   . Cirrhosis Brother   . Heart attack Maternal Uncle        X2; both > 55  . Prostate cancer Maternal Uncle   . Diabetes Neg Hx     Review of Systems  Constitutional: Negative for chills and fever.  Eyes: Negative for visual disturbance.  Respiratory: Positive for cough (occ when very active). Negative for shortness of breath and wheezing.   Cardiovascular: Negative for chest pain, palpitations and leg swelling.  Gastrointestinal: Negative for abdominal pain, blood in stool, constipation, diarrhea and nausea.       No gerd  Genitourinary: Negative for difficulty urinating, dysuria and hematuria.  Musculoskeletal: Positive for arthralgias. Negative for back pain.  Skin: Negative for color change and rash.  Neurological: Negative for light-headedness, numbness and headaches.  Psychiatric/Behavioral: Negative for dysphoric mood. The patient is not nervous/anxious.        Objective:   Vitals:   05/11/19 0746  BP: (!) 144/94  Pulse: 84  Resp: 16  Temp: 98.5 F (36.9 C)  SpO2: 95%   Filed Weights   05/11/19 0746  Weight: 252 lb (114.3 kg)   Body mass index is 35.15 kg/m.  Wt Readings from Last 3 Encounters:  05/11/19 252 lb (114.3 kg)  04/29/18 243 lb (110.2 kg)  10/16/17 255 lb (115.7 kg)     Hearing Screening   '125Hz'$  '250Hz'$  '500Hz'$  '1000Hz'$  '2000Hz'$  '3000Hz'$  '4000Hz'$  '6000Hz'$  '8000Hz'$   Right ear:           Left ear:              Visual Acuity Screening   Right eye Left eye Both eyes  Without correction:     With correction: '20/15 20/13 20/13 '$       Office Visit from 05/11/2019 in [place]  PHQ-2 Total Score  0        Physical Exam Constitutional: He appears well-developed and well-nourished. No distress.  HENT:  Head: Normocephalic and atraumatic.  Right Ear: External ear normal.  Left Ear: External ear normal.  Mouth/Throat: Oropharynx is clear and moist.  Normal ear canals and TM b/l  Eyes: Conjunctivae and EOM are normal.  Neck: Neck supple. No tracheal deviation present. No thyromegaly present. No carotid bruit  Cardiovascular: Normal rate, regular rhythm, normal heart sounds and intact distal pulses.  No murmur heard. Pulmonary/Chest: Effort normal and breath sounds normal. No respiratory distress. He has no wheezes. He has no rales.  Abdominal: Soft. Obese.  He exhibits no distension. There is no tenderness.  Genitourinary: deferred  Musculoskeletal: He exhibits no edema.  Lymphadenopathy:   He has no cervical adenopathy.  Skin: Skin is warm and dry. He is not diaphoretic.  Psychiatric: He has a normal mood and affect. His behavior is normal.  Assessment & Plan:   Wellness Exam: Immunizations  prevnar deferred, discussed shingrix and flu Colonoscopy  Due - will schedule  Eye exam   Up to date  Hearing loss  Mild - no concerns - does not affect his daily activities Memory concerns/difficulties   none Independent of ADLs   Fully independent EKG:  NSR at 75 bpm, normal EKG, compared to prior EKG from 2013 there is no significant change Exercise:  Active, but no regular exercise Stressed the importance of regular exercise Reviewed a healthy diet. encouraged regular sunscreen use - monitor for skin changes/mole changes  Patient received copy of preventative screening tests/immunizations recommended for the next 5-10 years.    Physical exam: Screening  blood work   ordered Immunizations  prevnar deferred, discussed shingrix and flu Colonoscopy   Due - will schedule Eye exams   Up to date  Exercise  Active, no regular exercise - stressed the importance of regular exercise Weight   Advised weight loss Skin   No concerns Substance abuse   none  See Problem List for Assessment and Plan of chronic medical problems.   FU in 6 months

## 2019-05-09 NOTE — Patient Instructions (Addendum)
Richard Trevino , Thank you for taking time to come for your Medicare Wellness Visit. I appreciate your ongoing commitment to your health goals. Please review the following plan we discussed and let me know if I can assist you in the future.   These are the goals we discussed: Goals   Start exercising regularly and work on weigh loss     This is a list of the screening recommended for you and due dates:  Health Maintenance  Topic Date Due  . Colon Cancer Screening  12/02/2018  . Flu Shot  05/02/2019  . Pneumonia vaccines (1 of 2 - PCV13) 05/10/2020*  . Tetanus Vaccine  06/19/2025  .  Hepatitis C: One time screening is recommended by Center for Disease Control  (CDC) for  adults born from [birth year] through 1964.   Completed  *Topic was postponed. The date shown is not the original due date.     Tests ordered today. Your results will be released to [place] [facility] (or called to you) after review.  If any changes need to be made, you will be notified at that same time.  All other Health Maintenance issues reviewed.   All recommended immunizations and age-appropriate screenings are up-to-date or discussed.  No immunization administered today.   Medications reviewed and updated.  Changes include :   Start amlodipine 2.5 mg daily.  Your prescription(s) have been submitted to your pharmacy. Please take as directed and contact our office if you believe you are having problem(s) with the medication(s).   Please followup in 6 months    Health Maintenance, Male Adopting a healthy lifestyle and getting preventive care are important in promoting health and wellness. Ask your health care provider about:  The right schedule for you to have regular tests and exams.  Things you can do on your own to prevent diseases and keep yourself healthy. What should I know about diet, weight, and exercise? Eat a healthy diet   Eat a diet that includes plenty of vegetables, fruits, low-fat dairy products, and lean  protein.  Do not eat a lot of foods that are high in solid fats, added sugars, or sodium. Maintain a healthy weight Body mass index (BMI) is a measurement that can be used to identify possible weight problems. It estimates body fat based on height and weight. Your health care provider can help determine your BMI and help you achieve or maintain a healthy weight. Get regular exercise Get regular exercise. This is one of the most important things you can do for your health. Most adults should:  Exercise for at least 150 minutes each week. The exercise should increase your heart rate and make you sweat (moderate-intensity exercise).  Do strengthening exercises at least twice a week. This is in addition to the moderate-intensity exercise.  Spend less time sitting. Even light physical activity can be beneficial. Watch cholesterol and blood lipids Have your blood tested for lipids and cholesterol at 67 years of age, then have this test every 5 years. You may need to have your cholesterol levels checked more often if:  Your lipid or cholesterol levels are high.  You are older than 67 years of age.  You are at high risk for heart disease. What should I know about cancer screening? Many types of cancers can be detected early and may often be prevented. Depending on your health history and family history, you may need to have cancer screening at various ages. This may include screening for:  Colorectal cancer.  Prostate cancer.  Skin cancer.  Lung cancer. What should I know about heart disease, diabetes, and high blood pressure? Blood pressure and heart disease  High blood pressure causes heart disease and increases the risk of stroke. This is more likely to develop in people who have high blood pressure readings, are of African descent, or are overweight.  Talk with your health care provider about your target blood pressure readings.  Have your blood pressure checked: ? Every 3-5 years  if you are 6-30 years of age. ? Every year if you are 52 years old or older.  If you are between the ages of 35 and 35 and are a current or former smoker, ask your health care provider if you should have a one-time screening for abdominal aortic aneurysm (AAA). Diabetes Have regular diabetes screenings. This checks your fasting blood sugar level. Have the screening done:  Once every three years after age 76 if you are at a normal weight and have a low risk for diabetes.  More often and at a younger age if you are overweight or have a high risk for diabetes. What should I know about preventing infection? Hepatitis B If you have a higher risk for hepatitis B, you should be screened for this virus. Talk with your health care provider to find out if you are at risk for hepatitis B infection. Hepatitis C Blood testing is recommended for:  Everyone born from [birth year] through 1964.  Anyone with known risk factors for hepatitis C. Sexually transmitted infections (STIs)  You should be screened each year for STIs, including gonorrhea and chlamydia, if: ? You are sexually active and are younger than 67 years of age. ? You are older than 67 years of age and your health care provider tells you that you are at risk for this type of infection. ? Your sexual activity has changed since you were last screened, and you are at increased risk for chlamydia or gonorrhea. Ask your health care provider if you are at risk.  Ask your health care provider about whether you are at high risk for HIV. Your health care provider may recommend a prescription medicine to help prevent HIV infection. If you choose to take medicine to prevent HIV, you should first get tested for HIV. You should then be tested every 3 months for as long as you are taking the medicine. Follow these instructions at home: Lifestyle  Do not use any products that contain nicotine or tobacco, such as cigarettes, e-cigarettes, and chewing tobacco. If  you need help quitting, ask your health care provider.  Do not use street drugs.  Do not share needles.  Ask your health care provider for help if you need support or information about quitting drugs. Alcohol use  Do not drink alcohol if your health care provider tells you not to drink.  If you drink alcohol: ? Limit how much you have to 0-2 drinks a day. ? Be aware of how much alcohol is in your drink. In the [place] one drink equals one 12 oz bottle of beer (355 mL), one 5 oz glass of wine (148 mL), or one 1 oz glass of hard liquor (44 mL). General instructions  Schedule regular health, dental, and eye exams.  Stay current with your vaccines.  Tell your health care provider if: ? You often feel depressed. ? You have ever been abused or do not feel safe at home. Summary  Adopting a healthy lifestyle and getting preventive care are  important in promoting health and wellness.  Follow your health care provider's instructions about healthy diet, exercising, and getting tested or screened for diseases.  Follow your health care provider's instructions on monitoring your cholesterol and blood pressure. This information is not intended to replace advice given to you by your health care provider. Make sure you discuss any questions you have with your health care provider. Document Released: 03/15/2008 Document Revised: 09/10/2018 Document Reviewed: 09/10/2018 Elsevier Patient Education  2020 [employer].

## 2019-05-11 ENCOUNTER — Encounter: Payer: Self-pay | Admitting: Internal Medicine

## 2019-05-11 ENCOUNTER — Other Ambulatory Visit (INDEPENDENT_AMBULATORY_CARE_PROVIDER_SITE_OTHER): Payer: Medicare Other

## 2019-05-11 ENCOUNTER — Ambulatory Visit (INDEPENDENT_AMBULATORY_CARE_PROVIDER_SITE_OTHER): Payer: Medicare Other | Admitting: Internal Medicine

## 2019-05-11 ENCOUNTER — Other Ambulatory Visit: Payer: Self-pay

## 2019-05-11 VITALS — BP 144/94 | HR 84 | Temp 98.5°F | Resp 16 | Ht 71.0 in | Wt 252.0 lb

## 2019-05-11 DIAGNOSIS — I10 Essential (primary) hypertension: Secondary | ICD-10-CM | POA: Diagnosis not present

## 2019-05-11 DIAGNOSIS — K219 Gastro-esophageal reflux disease without esophagitis: Secondary | ICD-10-CM

## 2019-05-11 DIAGNOSIS — Z0000 Encounter for general adult medical examination without abnormal findings: Secondary | ICD-10-CM

## 2019-05-11 DIAGNOSIS — E785 Hyperlipidemia, unspecified: Secondary | ICD-10-CM

## 2019-05-11 DIAGNOSIS — R739 Hyperglycemia, unspecified: Secondary | ICD-10-CM

## 2019-05-11 DIAGNOSIS — N401 Enlarged prostate with lower urinary tract symptoms: Secondary | ICD-10-CM

## 2019-05-11 DIAGNOSIS — Z125 Encounter for screening for malignant neoplasm of prostate: Secondary | ICD-10-CM

## 2019-05-11 DIAGNOSIS — F3289 Other specified depressive episodes: Secondary | ICD-10-CM

## 2019-05-11 LAB — COMPREHENSIVE METABOLIC PANEL
ALT: 23 U/L (ref 0–53)
AST: 17 U/L (ref 0–37)
Albumin: 4.6 g/dL (ref 3.5–5.2)
Alkaline Phosphatase: 75 U/L (ref 39–117)
BUN: 25 mg/dL — ABNORMAL HIGH (ref 6–23)
CO2: 25 mEq/L (ref 19–32)
Calcium: 9.6 mg/dL (ref 8.4–10.5)
Chloride: 107 mEq/L (ref 96–112)
Creatinine, Ser: 1.1 mg/dL (ref 0.40–1.50)
GFR: 66.79 mL/min (ref >60.00)
Glucose, Bld: 115 mg/dL — ABNORMAL HIGH (ref 70–99)
Potassium: 4.1 mEq/L (ref 3.5–5.1)
Sodium: 141 mEq/L (ref 135–145)
Total Bilirubin: 1.6 mg/dL — ABNORMAL HIGH (ref 0.2–1.2)
Total Protein: 6.7 g/dL (ref 6.0–8.3)

## 2019-05-11 LAB — HEMOGLOBIN A1C: Hgb A1c MFr Bld: 5.5 % (ref 4.6–6.5)

## 2019-05-11 LAB — TSH: TSH: 3.14 u[IU]/mL (ref 0.35–4.50)

## 2019-05-11 LAB — CBC WITH DIFFERENTIAL/PLATELET
Basophils Absolute: 0 10*3/uL (ref 0.0–0.1)
Basophils Relative: 0.6 % (ref 0.0–3.0)
Eosinophils Absolute: 0.2 10*3/uL (ref 0.0–0.7)
Eosinophils Relative: 2.2 % (ref 0.0–5.0)
HCT: 44.3 % (ref 39.0–52.0)
Hemoglobin: 15.6 g/dL (ref 13.0–17.0)
Lymphocytes Relative: 20 % (ref 12.0–46.0)
Lymphs Abs: 1.4 10*3/uL (ref 0.7–4.0)
MCHC: 35.3 g/dL (ref 30.0–36.0)
MCV: 91 fl (ref 78.0–100.0)
Monocytes Absolute: 0.4 10*3/uL (ref 0.1–1.0)
Monocytes Relative: 6.4 % (ref 3.0–12.0)
Neutro Abs: 4.9 10*3/uL (ref 1.4–7.7)
Neutrophils Relative %: 70.8 % (ref 43.0–77.0)
Platelets: 161 10*3/uL (ref 150.0–400.0)
RBC: 4.87 Mil/uL (ref 4.22–5.81)
RDW: 12.4 % (ref 11.5–15.5)
WBC: 7 10*3/uL (ref 4.0–10.5)

## 2019-05-11 LAB — LIPID PANEL
Cholesterol: 174 mg/dL (ref 0–200)
HDL: 31.8 mg/dL — ABNORMAL LOW (ref >39.00)
NonHDL: 142.26
Total CHOL/HDL Ratio: 5
Triglycerides: 381 mg/dL — ABNORMAL HIGH (ref 0.0–149.0)
VLDL: 76.2 mg/dL — ABNORMAL HIGH (ref 0.0–40.0)

## 2019-05-11 LAB — LDL CHOLESTEROL, DIRECT: Direct LDL: 100 mg/dL

## 2019-05-11 LAB — PSA, MEDICARE: PSA: 0.94 ng/ml (ref 0.10–4.00)

## 2019-05-11 MED ORDER — AMLODIPINE BESYLATE 2.5 MG PO TABS: 2.5000 mg | ORAL_TABLET | Freq: Every day | ORAL | 1 refills | Status: DC

## 2019-05-11 MED ORDER — PRAVASTATIN SODIUM 20 MG PO TABS: ORAL_TABLET | ORAL | 1 refills | Status: DC

## 2019-05-11 MED ORDER — LOSARTAN POTASSIUM 100 MG PO TABS: 100.0000 mg | ORAL_TABLET | Freq: Every day | ORAL | 1 refills | Status: DC

## 2019-05-11 NOTE — Assessment & Plan Note (Signed)
GERD controlled Continue daily medication  

## 2019-05-11 NOTE — Assessment & Plan Note (Signed)
No obstructive symptoms Check psa Continue flomax

## 2019-05-11 NOTE — Assessment & Plan Note (Signed)
BP not ideally controlled Add amlodipine 2.5 mg daily Continue other medications Stressed regular exercise and weight loss Low sodium diet cmp FU in 6 months, monitor BP at home - discussed goal

## 2019-05-11 NOTE — Assessment & Plan Note (Signed)
Controlled, stable Continue current dose of medication - prozac 20 mg daily

## 2019-05-11 NOTE — Assessment & Plan Note (Signed)
a1c

## 2019-05-11 NOTE — Assessment & Plan Note (Signed)
Check lipid panel,cmp ,tsh Continue daily statin Regular exercise and healthy diet encouraged  

## 2019-05-11 NOTE — Addendum Note (Signed)
Addended by: Delice Bison E on: 05/11/2019 09:23 AM   Modules accepted: Orders

## 2019-05-14 ENCOUNTER — Encounter: Payer: Self-pay | Admitting: Internal Medicine

## 2019-05-16 DIAGNOSIS — M47816 Spondylosis without myelopathy or radiculopathy, lumbar region: Secondary | ICD-10-CM | POA: Diagnosis not present

## 2019-05-16 DIAGNOSIS — S0990XA Unspecified injury of head, initial encounter: Secondary | ICD-10-CM | POA: Diagnosis not present

## 2019-05-16 DIAGNOSIS — S199XXA Unspecified injury of neck, initial encounter: Secondary | ICD-10-CM | POA: Diagnosis not present

## 2019-05-16 DIAGNOSIS — S39012A Strain of muscle, fascia and tendon of lower back, initial encounter: Secondary | ICD-10-CM | POA: Diagnosis not present

## 2019-05-16 DIAGNOSIS — M5136 Other intervertebral disc degeneration, lumbar region: Secondary | ICD-10-CM | POA: Diagnosis not present

## 2019-05-16 DIAGNOSIS — M542 Cervicalgia: Secondary | ICD-10-CM | POA: Diagnosis not present

## 2019-07-15 ENCOUNTER — Encounter: Payer: Self-pay | Admitting: Gastroenterology

## 2019-08-14 ENCOUNTER — Other Ambulatory Visit: Payer: Self-pay | Admitting: Internal Medicine

## 2019-08-20 ENCOUNTER — Other Ambulatory Visit: Payer: Self-pay

## 2019-08-20 ENCOUNTER — Ambulatory Visit (AMBULATORY_SURGERY_CENTER): Payer: Medicare Other | Admitting: *Deleted

## 2019-08-20 VITALS — Temp 97.3°F | Ht 71.0 in | Wt 258.0 lb

## 2019-08-20 DIAGNOSIS — Z86010 Personal history of colonic polyps: Secondary | ICD-10-CM

## 2019-08-20 DIAGNOSIS — Z1159 Encounter for screening for other viral diseases: Secondary | ICD-10-CM

## 2019-08-20 MED ORDER — PLENVU 140 G PO SOLR: 1.0000 | Freq: Once | ORAL | 0 refills | Status: AC

## 2019-08-20 NOTE — Progress Notes (Signed)
Patient is here in-person for PV. Patient denies any allergies to eggs or soy. Patient denies any problems with anesthesia/sedation. Patient denies any oxygen use at home. Patient denies taking any diet/weight loss medications or blood thinners. Patient is not being treated for MRSA or C-diff.   COVID-19 screening test is on 09/01/2019, the pt is aware. Pt is aware that care partner will wait in the car during procedure; if they feel like they will be too hot or cold to wait in the car; they may wait in the 4 th floor lobby. Patient is aware to bring only one care partner. We want them to wear a mask (we do not have any that we can provide them), practice social distancing, and we will check their temperatures when they get here.  I did remind the patient that their care partner needs to stay in the parking lot the entire time and have a cell phone available, we will call them when the pt is ready for discharge. Patient will wear mask into building.

## 2019-08-21 ENCOUNTER — Encounter: Payer: Self-pay | Admitting: Gastroenterology

## 2019-08-21 ENCOUNTER — Other Ambulatory Visit: Payer: Self-pay | Admitting: Internal Medicine

## 2019-09-01 ENCOUNTER — Other Ambulatory Visit: Payer: Self-pay | Admitting: Gastroenterology

## 2019-09-01 ENCOUNTER — Ambulatory Visit (INDEPENDENT_AMBULATORY_CARE_PROVIDER_SITE_OTHER): Payer: Medicare Other

## 2019-09-01 DIAGNOSIS — Z1159 Encounter for screening for other viral diseases: Secondary | ICD-10-CM

## 2019-09-02 LAB — SARS CORONAVIRUS 2 (TAT 6-24 HRS): SARS Coronavirus 2: NEGATIVE

## 2019-09-03 ENCOUNTER — Encounter: Payer: Self-pay | Admitting: Gastroenterology

## 2019-09-03 ENCOUNTER — Other Ambulatory Visit: Payer: Self-pay | Admitting: Gastroenterology

## 2019-09-03 ENCOUNTER — Other Ambulatory Visit: Payer: Self-pay

## 2019-09-03 ENCOUNTER — Ambulatory Visit (AMBULATORY_SURGERY_CENTER): Payer: Medicare Other | Admitting: Gastroenterology

## 2019-09-03 VITALS — BP 126/93 | HR 84 | Temp 98.1°F | Resp 17 | Ht 71.0 in | Wt 258.0 lb

## 2019-09-03 DIAGNOSIS — Z86010 Personal history of colonic polyps: Secondary | ICD-10-CM

## 2019-09-03 DIAGNOSIS — D123 Benign neoplasm of transverse colon: Secondary | ICD-10-CM

## 2019-09-03 DIAGNOSIS — K219 Gastro-esophageal reflux disease without esophagitis: Secondary | ICD-10-CM | POA: Diagnosis not present

## 2019-09-03 DIAGNOSIS — D122 Benign neoplasm of ascending colon: Secondary | ICD-10-CM

## 2019-09-03 DIAGNOSIS — I10 Essential (primary) hypertension: Secondary | ICD-10-CM | POA: Diagnosis not present

## 2019-09-03 MED ORDER — SODIUM CHLORIDE 0.9 % IV SOLN: 500.0000 mL | Freq: Once | INTRAVENOUS | Status: DC

## 2019-09-03 NOTE — Progress Notes (Signed)
Report to PACU, RN, vss, BBS= Clear.  

## 2019-09-03 NOTE — Progress Notes (Signed)
Called to room to assist during endoscopic procedure.  Patient ID and intended procedure confirmed with present staff. Received instructions for my participation in the procedure from the performing physician.  

## 2019-09-03 NOTE — Patient Instructions (Signed)
Impression/Recommendations:  Polyp handout given to patient. Diverticulitis handout given to patient. Hemorrhoid handout given to patient. High fiber diet handout given to patient.  Repeat colonoscopy for surveillance.  Date to be determined after pathology results reviewed.  Most likely in 3 years.  Continue present medications. Await pathology results.  YOU HAD AN ENDOSCOPIC PROCEDURE TODAY AT [facility]:   Refer to the procedure report that was given to you for any specific questions about what was found during the examination.  If the procedure report does not answer your questions, please call your gastroenterologist to clarify.  If you requested that your care partner not be given the details of your procedure findings, then the procedure report has been included in a sealed envelope for you to review at your convenience later.  YOU SHOULD EXPECT: Some feelings of bloating in the abdomen. Passage of more gas than usual.  Walking can help get rid of the air that was put into your GI tract during the procedure and reduce the bloating. If you had a lower endoscopy (such as a colonoscopy or flexible sigmoidoscopy) you may notice spotting of blood in your stool or on the toilet paper. If you underwent a bowel prep for your procedure, you may not have a normal bowel movement for a few days.  Please Note:  You might notice some irritation and congestion in your nose or some drainage.  This is from the oxygen used during your procedure.  There is no need for concern and it should clear up in a day or so.  SYMPTOMS TO REPORT IMMEDIATELY:   Following lower endoscopy (colonoscopy or flexible sigmoidoscopy):  Excessive amounts of blood in the stool  Significant tenderness or worsening of abdominal pains  Swelling of the abdomen that is new, acute  Fever of 100F or higher For urgent or emergent issues, a gastroenterologist can be reached at any hour by calling [phone]  [phone].   DIET:  We do recommend a small meal at first, but then you may proceed to your regular diet.  Drink plenty of fluids but you should avoid alcoholic beverages for 24 hours.  ACTIVITY:  You should plan to take it easy for the rest of today and you should NOT DRIVE or use heavy machinery until tomorrow (because of the sedation medicines used during the test).    FOLLOW UP: Our staff will call the number listed on your records 48-72 hours following your procedure to check on you and address any questions or concerns that you may have regarding the information given to you following your procedure. If we do not reach you, we will leave a message.  We will attempt to reach you two times.  During this call, we will ask if you have developed any symptoms of COVID 19. If you develop any symptoms (ie: fever, flu-like symptoms, shortness of breath, cough etc.) before then, please call [phone].  If you test positive for Covid 19 in the 2 weeks post procedure, please call and report this information to [place].    If any biopsies were taken you will be contacted by phone or by letter within the next 1-3 weeks.  Please call us at [phone] if you have not heard about the biopsies in 3 weeks.    SIGNATURES/CONFIDENTIALITY: You and/or your care partner have signed paperwork which will be entered into your electronic medical record.  These signatures attest to the fact that that the information above on your After Visit  Summary has been reviewed and is understood.  Full responsibility of the confidentiality of this discharge information lies with you and/or your care-partner.

## 2019-09-03 NOTE — Op Note (Signed)
Ord Patient Name: Richard Trevino Procedure Date: 09/03/2019 8:28 AM MRN: [id number] Endoscopist: Ladene Artist , MD Age: 68 Referring MD:  Date of Birth: 1950-10-26 Gender: Male Account #: [id number] Procedure:                Colonoscopy Indications:              Surveillance: Personal history of adenomatous                            polyps and SSP on last colonoscopy 5 years ago Medicines:                Monitored Anesthesia Care Procedure:                Pre-Anesthesia Assessment:                           - Prior to the procedure, a History and Physical                            was performed, and patient medications and                            allergies were reviewed. The patient's tolerance of                            previous anesthesia was also reviewed. The risks                            and benefits of the procedure and the sedation                            options and risks were discussed with the patient.                            All questions were answered, and informed consent                            was obtained. Prior Anticoagulants: The patient has                            taken no previous anticoagulant or antiplatelet                            agents. ASA Grade Assessment: II - A patient with                            mild systemic disease. After reviewing the risks                            and benefits, the patient was deemed in                            satisfactory condition to undergo the procedure.  After obtaining informed consent, the colonoscope                            was passed under direct vision. Throughout the                            procedure, the patient's blood pressure, pulse, and                            oxygen saturations were monitored continuously. The                            Colonoscope was introduced through the anus and                            advanced to the  the cecum, identified by                            appendiceal orifice and ileocecal valve. The                            ileocecal valve, appendiceal orifice, and rectum                            were photographed. The quality of the bowel                            preparation was excellent. The colonoscopy was                            performed without difficulty. The patient tolerated                            the procedure well. Scope In: 8:34:50 AM Scope Out: 8:52:01 AM Scope Withdrawal Time: 0 hours 14 minutes 47 seconds  Total Procedure Duration: 0 hours 17 minutes 11 seconds  Findings:                 The perianal and digital rectal examinations were                            normal.                           Six sessile polyps were found in the transverse                            colon (5) and ascending colon (1). The polyps were                            5 to 9 mm in size. These polyps were removed with a                            cold snare. Resection and retrieval were complete.  A few small-mouthed diverticula were found in the                            left colon. There was no evidence of diverticular                            bleeding.                           Internal hemorrhoids were found during                            retroflexion. The hemorrhoids were medium-sized and                            Grade I (internal hemorrhoids that do not prolapse).                           The exam was otherwise without abnormality on                            direct and retroflexion views. Complications:            No immediate complications. Estimated blood loss:                            None. Estimated Blood Loss:     Estimated blood loss: none. Impression:               - Six 5 to 9 mm polyps in the transverse colon and                            in the ascending colon, removed with a cold snare.                            Resected and  retrieved.                           - Mild diverticulosis in the left colon.                           - Internal hemorrhoids.                           - The examination was otherwise normal on direct                            and retroflexion views. Recommendation:           - Repeat colonoscopy after studies are complete for                            surveillance based on pathology results, likely 3                            year interval.                           -  Patient has a contact number available for                            emergencies. The signs and symptoms of potential                            delayed complications were discussed with the                            patient. Return to normal activities tomorrow.                            Written discharge instructions were provided to the                            patient.                           - High fiber diet.                           - Continue present medications.                           - Await pathology results. Ladene Artist, MD 09/03/2019 8:57:57 AM This report has been signed electronically.

## 2019-09-03 NOTE — Progress Notes (Signed)
Pt's states no medical or surgical changes since previsit or office visit. 

## 2019-09-07 ENCOUNTER — Telehealth: Payer: Self-pay | Admitting: *Deleted

## 2019-09-07 ENCOUNTER — Telehealth: Payer: Self-pay

## 2019-09-07 NOTE — Telephone Encounter (Signed)
  Follow up Call-  Call back number 09/03/2019  Post procedure Call Back phone  # [phone]  Permission to leave phone message Yes  Some recent data might be hidden     No answer, left message

## 2019-09-07 NOTE — Telephone Encounter (Signed)
  Follow up Call-  Call back number 09/03/2019  Post procedure Call Back phone  # [phone]  Permission to leave phone message Yes  Some recent data might be hidden     Patient questions:  Do you have a fever, pain , or abdominal swelling? No. Pain Score  0 *  Have you tolerated food without any problems? Yes.    Have you been able to return to your normal activities? Yes.    Do you have any questions about your discharge instructions: Diet   No. Medications  No. Follow up visit  No.  Do you have questions or concerns about your Care? No.  Actions: * If pain score is 4 or above: No action needed, pain <4. 1. Have you developed a fever since your procedure? no  2.   Have you had an respiratory symptoms (SOB or cough) since your procedure? no  3.   Have you tested positive for COVID 19 since your procedure no  4.   Have you had any family members/close contacts diagnosed with the COVID 19 since your procedure?  no   If yes to any of these questions please route to Joylene John, RN and Alphonsa Gin, Therapist, sports.

## 2019-09-14 ENCOUNTER — Encounter: Payer: Self-pay | Admitting: Gastroenterology

## 2019-09-25 DIAGNOSIS — F329 Major depressive disorder, single episode, unspecified: Secondary | ICD-10-CM | POA: Diagnosis not present

## 2019-09-25 DIAGNOSIS — U071 COVID-19: Secondary | ICD-10-CM | POA: Diagnosis not present

## 2019-09-25 DIAGNOSIS — I340 Nonrheumatic mitral (valve) insufficiency: Secondary | ICD-10-CM | POA: Diagnosis not present

## 2019-09-25 DIAGNOSIS — F419 Anxiety disorder, unspecified: Secondary | ICD-10-CM | POA: Diagnosis not present

## 2019-09-25 DIAGNOSIS — R9431 Abnormal electrocardiogram [ECG] [EKG]: Secondary | ICD-10-CM | POA: Diagnosis not present

## 2019-09-25 DIAGNOSIS — I4891 Unspecified atrial fibrillation: Secondary | ICD-10-CM | POA: Diagnosis not present

## 2019-09-25 DIAGNOSIS — R0603 Acute respiratory distress: Secondary | ICD-10-CM | POA: Diagnosis not present

## 2019-09-25 DIAGNOSIS — J1289 Other viral pneumonia: Secondary | ICD-10-CM | POA: Diagnosis not present

## 2019-09-25 DIAGNOSIS — I482 Chronic atrial fibrillation, unspecified: Secondary | ICD-10-CM | POA: Diagnosis not present

## 2019-09-25 DIAGNOSIS — I110 Hypertensive heart disease with heart failure: Secondary | ICD-10-CM | POA: Diagnosis not present

## 2019-09-25 DIAGNOSIS — I248 Other forms of acute ischemic heart disease: Secondary | ICD-10-CM | POA: Diagnosis not present

## 2019-09-25 DIAGNOSIS — R0902 Hypoxemia: Secondary | ICD-10-CM | POA: Diagnosis not present

## 2019-09-25 DIAGNOSIS — K219 Gastro-esophageal reflux disease without esophagitis: Secondary | ICD-10-CM | POA: Diagnosis not present

## 2019-09-25 DIAGNOSIS — I471 Supraventricular tachycardia: Secondary | ICD-10-CM | POA: Diagnosis not present

## 2019-09-25 DIAGNOSIS — R000 Tachycardia, unspecified: Secondary | ICD-10-CM | POA: Diagnosis not present

## 2019-09-25 DIAGNOSIS — I4581 Long QT syndrome: Secondary | ICD-10-CM | POA: Diagnosis not present

## 2019-09-25 DIAGNOSIS — Z7982 Long term (current) use of aspirin: Secondary | ICD-10-CM | POA: Diagnosis not present

## 2019-09-25 DIAGNOSIS — I517 Cardiomegaly: Secondary | ICD-10-CM | POA: Diagnosis not present

## 2019-09-25 DIAGNOSIS — R918 Other nonspecific abnormal finding of lung field: Secondary | ICD-10-CM | POA: Diagnosis not present

## 2019-09-25 DIAGNOSIS — J9600 Acute respiratory failure, unspecified whether with hypoxia or hypercapnia: Secondary | ICD-10-CM | POA: Diagnosis not present

## 2019-09-25 DIAGNOSIS — J9601 Acute respiratory failure with hypoxia: Secondary | ICD-10-CM | POA: Diagnosis not present

## 2019-09-25 DIAGNOSIS — I441 Atrioventricular block, second degree: Secondary | ICD-10-CM | POA: Diagnosis not present

## 2019-09-25 DIAGNOSIS — Z79899 Other long term (current) drug therapy: Secondary | ICD-10-CM | POA: Diagnosis not present

## 2019-09-25 DIAGNOSIS — Z66 Do not resuscitate: Secondary | ICD-10-CM | POA: Diagnosis not present

## 2019-09-25 DIAGNOSIS — N400 Enlarged prostate without lower urinary tract symptoms: Secondary | ICD-10-CM | POA: Diagnosis not present

## 2019-09-25 DIAGNOSIS — I4892 Unspecified atrial flutter: Secondary | ICD-10-CM | POA: Diagnosis not present

## 2019-09-25 DIAGNOSIS — J189 Pneumonia, unspecified organism: Secondary | ICD-10-CM | POA: Diagnosis not present

## 2019-09-25 DIAGNOSIS — R7989 Other specified abnormal findings of blood chemistry: Secondary | ICD-10-CM | POA: Diagnosis not present

## 2019-09-25 DIAGNOSIS — E785 Hyperlipidemia, unspecified: Secondary | ICD-10-CM | POA: Diagnosis not present

## 2019-09-25 DIAGNOSIS — I509 Heart failure, unspecified: Secondary | ICD-10-CM | POA: Diagnosis not present

## 2019-09-25 DIAGNOSIS — I10 Essential (primary) hypertension: Secondary | ICD-10-CM | POA: Diagnosis not present

## 2019-09-25 DIAGNOSIS — R0602 Shortness of breath: Secondary | ICD-10-CM | POA: Diagnosis not present

## 2019-09-25 DIAGNOSIS — R509 Fever, unspecified: Secondary | ICD-10-CM | POA: Diagnosis not present

## 2019-10-05 ENCOUNTER — Telehealth: Payer: Self-pay | Admitting: *Deleted

## 2019-10-05 NOTE — Telephone Encounter (Signed)
Rec'd msg off Patient Richard Trevino stating on 09/25/19 @ 5:10pm pt was admitted to [facility]. On 09/30/19 @ 12:25 PM Pt  [facility] inpatient to Hospital. Will call pt to follow=-up and make hosp f/u appt if need.Marland KitchenJohny Chess

## 2019-10-06 NOTE — Telephone Encounter (Signed)
Tried calling pt to follow=up there was no answer LMOM RTC.Marland KitchenJohny Trevino

## 2019-10-07 NOTE — Telephone Encounter (Signed)
Tried calling pt again still no answer. Per chart pt is schedule for his 6 month f/u in February. Closing encounter not able to get in touch w/pt, and no call back has been made...Richard Trevino

## 2019-11-05 DIAGNOSIS — Z6834 Body mass index (BMI) 34.0-34.9, adult: Secondary | ICD-10-CM | POA: Diagnosis not present

## 2019-11-05 DIAGNOSIS — I480 Paroxysmal atrial fibrillation: Secondary | ICD-10-CM | POA: Insufficient documentation

## 2019-11-05 DIAGNOSIS — E6609 Other obesity due to excess calories: Secondary | ICD-10-CM | POA: Diagnosis not present

## 2019-11-05 DIAGNOSIS — I10 Essential (primary) hypertension: Secondary | ICD-10-CM | POA: Diagnosis not present

## 2019-11-05 DIAGNOSIS — I4892 Unspecified atrial flutter: Secondary | ICD-10-CM | POA: Insufficient documentation

## 2019-11-11 NOTE — Progress Notes (Signed)
Subjective:    Patient ID: Richard Trevino, male    DOB: 19-Apr-1951, 69 y.o.   MRN: [id number]  HPI The patient is here for follow up of their chronic medical problems, including hypertension, hyperlipidemia, GERD, depression, BPH, hyperglycemia  He had COVID in December and developed Aflutter.  He had a cardioversion and went back into sinus.  He is following with cardiology.  He is taking Eliquis daily as prescribed.  He denies any palpitations.   He is taking all of his medications as prescribed.    He is not currently exercising, but knows he needs to lose weight.  He plans on starting to walk up and down his street.  He does feel that he has low stamina and some fatigue since Covid.  He also stated less desire to do things.   BP controlled at home-he brought his log to review.  Oxygen saturation has been above 91.     Medications and allergies reviewed with patient and updated if appropriate.  Patient Active Problem List   Diagnosis Date Noted  . Leg cramping 10/16/2017  . Depression 07/30/2017  . SKIN CANCER, HX OF 11/07/2009  . Hyperglycemia 11/04/2008  . Hyperlipidemia 09/08/2007  . Essential hypertension 09/08/2007  . GERD (gastroesophageal reflux disease) 09/08/2007  . Hyperplasia of prostate with lower urinary tract symptoms (LUTS) 04/21/2007  . NEPHROLITHIASIS, HX OF 02/25/2007    Current Outpatient Medications on File Prior to Visit  Medication Sig Dispense Refill  . aspirin 325 MG EC tablet Take 325 mg by mouth daily.    Marland Kitchen CARTIA XT 120 MG 24 hr capsule Take 120 mg by mouth daily.    . Cholecalciferol (VITAMIN D3) 1000 UNITS CAPS Take by mouth daily.    . Coenzyme Q10 (COQ10) 50 MG CAPS Take by mouth daily.    . Cyanocobalamin (VITAMIN B 12 PO) Take by mouth.    Arne Cleveland 5 MG TABS tablet Take 5 mg by mouth 2 (two) times daily.    . Flaxseed, Linseed, (FLAX SEED OIL) 1000 MG CAPS Take by mouth.    Marland Kitchen FLUoxetine (PROZAC) 20 MG capsule TAKE ONE CAPSULE BY  MOUTH DAILY 90 capsule 1  . Glucosamine Sulfate 1000 MG TABS Take by mouth. 2 by mouth daily    . hydrochlorothiazide (HYDRODIURIL) 25 MG tablet TAKE ONE-HALF TABLET BY MOUTH DAILY 45 tablet 1  . losartan (COZAAR) 100 MG tablet Take 1 tablet (100 mg total) by mouth daily. 90 tablet 1  . Magnesium 400 MG TABS Take by mouth.    . metoprolol tartrate (LOPRESSOR) 25 MG tablet     . Multiple Vitamin (MULTIVITAMIN) tablet Take 1 tablet by mouth daily.      . pantoprazole (PROTONIX) 40 MG tablet TAKE ONE TABLET BY MOUTH DAILY 30 MINUTES BEFORE BREAKFAST 90 tablet 0  . pravastatin (PRAVACHOL) 20 MG tablet TAKE ONE-HALF TABLET BY MOUTH NIGHTLY AT BEDTIME 45 tablet 1  . tamsulosin (FLOMAX) 0.4 MG CAPS capsule TAKE ONE CAPSULE BY MOUTH DAILY 90 capsule 0  . VITAMIN E PO Take by mouth.    . Zinc 25 MG TABS Take by mouth.     No current facility-administered medications on file prior to visit.    Past Medical History:  Diagnosis Date  . Fasting hyperglycemia   . GERD (gastroesophageal reflux disease)   . Gilbert's syndrome   . Internal hemorrhoids   . Multiple fracture [id number]   Coccyx, finger,hand  . Rectal bleeding   .  Renal calculi    X 2  . Skin cancer    ? Squamous cell; Dr Angie Fava    Past Surgical History:  Procedure Laterality Date  . CHOLECYSTECTOMY  2003   Dr. Joseph Pierini   . COLONOSCOPY  [id number]   negative  . LITHOTRIPSY  2007   Dr Barnie Del, [facility]  . POLYPECTOMY    . UPPER GASTROINTESTINAL ENDOSCOPY  2003   Dr Fuller Plan    Social History   Socioeconomic History  . Marital status: Married    Spouse name: Pamala Hurry  . Number of children: 3  . Years of education: Not on file  . Highest education level: Not on file  Occupational History  . Occupation: truck Education administrator: [employer]  Tobacco Use  . Smoking status: Former Smoker    Quit date: 10/02/1967    Years since quitting: 52.1  . Smokeless tobacco: Never Used  . Tobacco comment: smoked 1968-69, up to 1/2  ppd  Substance and Sexual Activity  . Alcohol use: No  . Drug use: No  . Sexual activity: Not on file  Other Topics Concern  . Not on file  Social History Narrative  . Not on file   Social Determinants of Health   Financial Resource Strain:   . Difficulty of Paying Living Expenses: Not on file  Food Insecurity:   . Worried About Charity fundraiser in the Last Year: Not on file  . Ran Out of Food in the Last Year: Not on file  Transportation Needs:   . Lack of Transportation (Medical): Not on file  . Lack of Transportation (Non-Medical): Not on file  Physical Activity:   . Days of Exercise per Week: Not on file  . Minutes of Exercise per Session: Not on file  Stress:   . Feeling of Stress : Not on file  Social Connections:   . Frequency of Communication with Friends and Family: Not on file  . Frequency of Social Gatherings with Friends and Family: Not on file  . Attends Religious Services: Not on file  . Active Member of Clubs or Organizations: Not on file  . Attends Archivist Meetings: Not on file  . Marital Status: Not on file    Family History  Problem Relation Age of Onset  . Emphysema Father   . Hypertension Father   . Stroke Father         < 4  . Hypertension Mother   . Arthritis Mother   . Multiple myeloma Mother   . Skin cancer Brother   . Prostate cancer Brother   . Stroke Brother         > 55  . Depression Brother   . Cirrhosis Brother   . Heart attack Maternal Uncle        X2; both > 55  . Prostate cancer Maternal Uncle   . Diabetes Neg Hx   . Colon cancer Neg Hx   . Colon polyps Neg Hx   . Esophageal cancer Neg Hx   . Stomach cancer Neg Hx   . Rectal cancer Neg Hx     Review of Systems  Constitutional: Negative for chills and fever.       Low stamina  Respiratory: Positive for cough (occ), shortness of breath (with exertion - chronic and unchanged) and wheezing (occ).   Cardiovascular: Positive for chest pain (sometimes -sharp,  chest wall pain). Negative for palpitations and leg swelling.  Gastrointestinal:  GERD controlled  Genitourinary: Positive for difficulty urinating (mild, slow stream).  Neurological: Negative for dizziness, light-headedness and headaches.       Objective:   Vitals:   11/12/19 0740  BP: 122/80  Pulse: 72  Resp: 16  Temp: 98.3 F (36.8 C)  SpO2: 95%   BP Readings from Last 3 Encounters:  11/12/19 122/80  09/03/19 (!) 126/93  05/11/19 (!) 144/94   Wt Readings from Last 3 Encounters:  11/12/19 253 lb (114.8 kg)  09/03/19 258 lb (117 kg)  08/20/19 258 lb (117 kg)   Body mass index is 35.29 kg/m.      Physical Exam    Constitutional: Appears well-developed and well-nourished. No distress.  HENT:  Head: Normocephalic and atraumatic.  Neck: Neck supple. No tracheal deviation present. No thyromegaly present.  No cervical lymphadenopathy Cardiovascular: Normal rate, regular rhythm and normal heart sounds.   No murmur heard. No carotid bruit .  No edema Pulmonary/Chest: Effort normal and breath sounds normal. No respiratory distress. No has no wheezes. No rales.  Skin: Skin is warm and dry. Not diaphoretic.  Psychiatric: Normal mood and affect. Behavior is normal.      Assessment & Plan:    See Problem List for Assessment and Plan of chronic medical problems.    This visit occurred during the SARS-CoV-2 public health emergency.  Safety protocols were in place, including screening questions prior to the visit, additional usage of staff PPE, and extensive cleaning of exam room while observing appropriate contact time as indicated for disinfecting solutions.

## 2019-11-11 NOTE — Patient Instructions (Addendum)
  Blood work was ordered.     Medications reviewed and updated.  Changes include :   prozac increased to 40 mg daily  Your prescription(s) have been submitted to your pharmacy. Please take as directed and contact our office if you believe you are having problem(s) with the medication(s).     Please followup in 6 months

## 2019-11-12 ENCOUNTER — Other Ambulatory Visit: Payer: Self-pay

## 2019-11-12 ENCOUNTER — Encounter: Payer: Self-pay | Admitting: Internal Medicine

## 2019-11-12 ENCOUNTER — Ambulatory Visit (INDEPENDENT_AMBULATORY_CARE_PROVIDER_SITE_OTHER): Payer: Medicare Other | Admitting: Internal Medicine

## 2019-11-12 VITALS — BP 122/80 | HR 72 | Temp 98.3°F | Resp 16 | Ht 71.0 in | Wt 253.0 lb

## 2019-11-12 DIAGNOSIS — I10 Essential (primary) hypertension: Secondary | ICD-10-CM | POA: Diagnosis not present

## 2019-11-12 DIAGNOSIS — E669 Obesity, unspecified: Secondary | ICD-10-CM | POA: Insufficient documentation

## 2019-11-12 DIAGNOSIS — R739 Hyperglycemia, unspecified: Secondary | ICD-10-CM

## 2019-11-12 DIAGNOSIS — K219 Gastro-esophageal reflux disease without esophagitis: Secondary | ICD-10-CM | POA: Diagnosis not present

## 2019-11-12 DIAGNOSIS — N401 Enlarged prostate with lower urinary tract symptoms: Secondary | ICD-10-CM

## 2019-11-12 DIAGNOSIS — E7849 Other hyperlipidemia: Secondary | ICD-10-CM

## 2019-11-12 DIAGNOSIS — I4892 Unspecified atrial flutter: Secondary | ICD-10-CM

## 2019-11-12 DIAGNOSIS — F3289 Other specified depressive episodes: Secondary | ICD-10-CM

## 2019-11-12 DIAGNOSIS — Z6835 Body mass index (BMI) 35.0-35.9, adult: Secondary | ICD-10-CM

## 2019-11-12 DIAGNOSIS — E66812 Obesity, class 2: Secondary | ICD-10-CM

## 2019-11-12 LAB — COMPREHENSIVE METABOLIC PANEL
ALT: 23 U/L (ref 0–53)
AST: 22 U/L (ref 0–37)
Albumin: 4.4 g/dL (ref 3.5–5.2)
Alkaline Phosphatase: 64 U/L (ref 39–117)
BUN: 24 mg/dL — ABNORMAL HIGH (ref 6–23)
CO2: 27 mEq/L (ref 19–32)
Calcium: 9.6 mg/dL (ref 8.4–10.5)
Chloride: 104 mEq/L (ref 96–112)
Creatinine, Ser: 1.34 mg/dL (ref 0.40–1.50)
GFR: 53.1 mL/min — ABNORMAL LOW (ref >60.00)
Glucose, Bld: 116 mg/dL — ABNORMAL HIGH (ref 70–99)
Potassium: 4.2 mEq/L (ref 3.5–5.1)
Sodium: 139 mEq/L (ref 135–145)
Total Bilirubin: 1.5 mg/dL — ABNORMAL HIGH (ref 0.2–1.2)
Total Protein: 7.1 g/dL (ref 6.0–8.3)

## 2019-11-12 LAB — LIPID PANEL
Cholesterol: 169 mg/dL (ref 0–200)
HDL: 31.3 mg/dL — ABNORMAL LOW (ref >39.00)
NonHDL: 138.11
Total CHOL/HDL Ratio: 5
Triglycerides: 338 mg/dL — ABNORMAL HIGH (ref 0.0–149.0)
VLDL: 67.6 mg/dL — ABNORMAL HIGH (ref 0.0–40.0)

## 2019-11-12 LAB — CBC WITH DIFFERENTIAL/PLATELET
Basophils Absolute: 0 10*3/uL (ref 0.0–0.1)
Basophils Relative: 0.6 % (ref 0.0–3.0)
Eosinophils Absolute: 0.2 10*3/uL (ref 0.0–0.7)
Eosinophils Relative: 3.2 % (ref 0.0–5.0)
HCT: 44 % (ref 39.0–52.0)
Hemoglobin: 15.2 g/dL (ref 13.0–17.0)
Lymphocytes Relative: 18.2 % (ref 12.0–46.0)
Lymphs Abs: 1.3 10*3/uL (ref 0.7–4.0)
MCHC: 34.5 g/dL (ref 30.0–36.0)
MCV: 92.4 fl (ref 78.0–100.0)
Monocytes Absolute: 0.4 10*3/uL (ref 0.1–1.0)
Monocytes Relative: 5.9 % (ref 3.0–12.0)
Neutro Abs: 5.2 10*3/uL (ref 1.4–7.7)
Neutrophils Relative %: 72.1 % (ref 43.0–77.0)
Platelets: 160 10*3/uL (ref 150.0–400.0)
RBC: 4.77 Mil/uL (ref 4.22–5.81)
RDW: 14.1 % (ref 11.5–15.5)
WBC: 7.3 10*3/uL (ref 4.0–10.5)

## 2019-11-12 LAB — HEMOGLOBIN A1C: Hgb A1c MFr Bld: 5.6 % (ref 4.6–6.5)

## 2019-11-12 LAB — LDL CHOLESTEROL, DIRECT: Direct LDL: 88 mg/dL

## 2019-11-12 MED ORDER — FLUOXETINE HCL 40 MG PO CAPS: 40.0000 mg | ORAL_CAPSULE | Freq: Every day | ORAL | 3 refills | Status: DC

## 2019-11-12 NOTE — Assessment & Plan Note (Signed)
Chronic Not ideally controlled Will increase prozac to 40 mg daily

## 2019-11-12 NOTE — Assessment & Plan Note (Addendum)
chronic With hypertension, hyperlipidemia and hyperglycemia Stressed weight loss Start increasing exercising, dec portions and dec carbs / sugars F/u in 6 months

## 2019-11-12 NOTE — Assessment & Plan Note (Signed)
Chronic Check lipid panel  Continue daily statin Regular exercise and healthy diet encouraged  

## 2019-11-12 NOTE — Assessment & Plan Note (Signed)
Chronic Taking Flomax daily He does have slightly weakened stream, but feels overall his symptoms are controlled Continue Flomax once daily

## 2019-11-12 NOTE — Assessment & Plan Note (Addendum)
Chronic Check a1c Low sugar / carb diet Stressed regular exercise Advised weight loss 

## 2019-11-12 NOTE — Assessment & Plan Note (Signed)
Chronic BP well controlled Current regimen effective and well tolerated Continue current medications at current doses cmp  

## 2019-11-12 NOTE — Assessment & Plan Note (Signed)
Chronic GERD controlled Continue daily medication-pantoprazole 40 mg daily

## 2019-11-12 NOTE — Assessment & Plan Note (Signed)
following with cardio In sinus On eliquis Cbc, cmp

## 2019-11-13 ENCOUNTER — Encounter: Payer: Self-pay | Admitting: Internal Medicine

## 2019-11-20 ENCOUNTER — Other Ambulatory Visit: Payer: Self-pay | Admitting: Internal Medicine

## 2019-12-03 ENCOUNTER — Other Ambulatory Visit: Payer: Self-pay | Admitting: Internal Medicine

## 2019-12-21 DIAGNOSIS — E785 Hyperlipidemia, unspecified: Secondary | ICD-10-CM | POA: Diagnosis not present

## 2019-12-21 DIAGNOSIS — I10 Essential (primary) hypertension: Secondary | ICD-10-CM | POA: Diagnosis not present

## 2019-12-21 DIAGNOSIS — I4892 Unspecified atrial flutter: Secondary | ICD-10-CM | POA: Diagnosis not present

## 2020-01-06 DIAGNOSIS — D485 Neoplasm of uncertain behavior of skin: Secondary | ICD-10-CM | POA: Diagnosis not present

## 2020-01-06 DIAGNOSIS — C44519 Basal cell carcinoma of skin of other part of trunk: Secondary | ICD-10-CM | POA: Diagnosis not present

## 2020-01-06 DIAGNOSIS — C44311 Basal cell carcinoma of skin of nose: Secondary | ICD-10-CM | POA: Diagnosis not present

## 2020-01-06 DIAGNOSIS — L570 Actinic keratosis: Secondary | ICD-10-CM | POA: Diagnosis not present

## 2020-01-06 DIAGNOSIS — L918 Other hypertrophic disorders of the skin: Secondary | ICD-10-CM | POA: Diagnosis not present

## 2020-01-06 DIAGNOSIS — L578 Other skin changes due to chronic exposure to nonionizing radiation: Secondary | ICD-10-CM | POA: Diagnosis not present

## 2020-01-06 DIAGNOSIS — L820 Inflamed seborrheic keratosis: Secondary | ICD-10-CM | POA: Diagnosis not present

## 2020-01-22 DIAGNOSIS — I4892 Unspecified atrial flutter: Secondary | ICD-10-CM | POA: Diagnosis not present

## 2020-01-22 DIAGNOSIS — I10 Essential (primary) hypertension: Secondary | ICD-10-CM | POA: Diagnosis not present

## 2020-02-25 ENCOUNTER — Other Ambulatory Visit: Payer: Self-pay | Admitting: Internal Medicine

## 2020-03-07 DIAGNOSIS — C44311 Basal cell carcinoma of skin of nose: Secondary | ICD-10-CM | POA: Diagnosis not present

## 2020-03-22 DIAGNOSIS — E785 Hyperlipidemia, unspecified: Secondary | ICD-10-CM | POA: Diagnosis not present

## 2020-03-22 DIAGNOSIS — Z7901 Long term (current) use of anticoagulants: Secondary | ICD-10-CM | POA: Diagnosis not present

## 2020-03-22 DIAGNOSIS — I10 Essential (primary) hypertension: Secondary | ICD-10-CM | POA: Diagnosis not present

## 2020-03-22 DIAGNOSIS — I4892 Unspecified atrial flutter: Secondary | ICD-10-CM | POA: Diagnosis not present

## 2020-03-23 DIAGNOSIS — I484 Atypical atrial flutter: Secondary | ICD-10-CM | POA: Diagnosis not present

## 2020-03-23 DIAGNOSIS — I480 Paroxysmal atrial fibrillation: Secondary | ICD-10-CM | POA: Diagnosis not present

## 2020-03-28 ENCOUNTER — Other Ambulatory Visit: Payer: Self-pay | Admitting: Internal Medicine

## 2020-03-28 DIAGNOSIS — I4589 Other specified conduction disorders: Secondary | ICD-10-CM | POA: Diagnosis not present

## 2020-03-28 DIAGNOSIS — I4892 Unspecified atrial flutter: Secondary | ICD-10-CM | POA: Diagnosis not present

## 2020-04-13 DIAGNOSIS — R9431 Abnormal electrocardiogram [ECG] [EKG]: Secondary | ICD-10-CM | POA: Diagnosis not present

## 2020-04-13 DIAGNOSIS — I4892 Unspecified atrial flutter: Secondary | ICD-10-CM | POA: Diagnosis not present

## 2020-04-13 DIAGNOSIS — I4589 Other specified conduction disorders: Secondary | ICD-10-CM | POA: Diagnosis not present

## 2020-04-14 DIAGNOSIS — I4892 Unspecified atrial flutter: Secondary | ICD-10-CM | POA: Diagnosis not present

## 2020-04-14 DIAGNOSIS — I4589 Other specified conduction disorders: Secondary | ICD-10-CM | POA: Diagnosis not present

## 2020-05-06 DIAGNOSIS — I4891 Unspecified atrial fibrillation: Secondary | ICD-10-CM | POA: Diagnosis not present

## 2020-05-09 DIAGNOSIS — E785 Hyperlipidemia, unspecified: Secondary | ICD-10-CM | POA: Diagnosis not present

## 2020-05-09 DIAGNOSIS — K219 Gastro-esophageal reflux disease without esophagitis: Secondary | ICD-10-CM | POA: Diagnosis not present

## 2020-05-09 DIAGNOSIS — I10 Essential (primary) hypertension: Secondary | ICD-10-CM | POA: Diagnosis not present

## 2020-05-09 DIAGNOSIS — I4589 Other specified conduction disorders: Secondary | ICD-10-CM | POA: Diagnosis not present

## 2020-05-09 DIAGNOSIS — I4892 Unspecified atrial flutter: Secondary | ICD-10-CM | POA: Diagnosis not present

## 2020-05-09 DIAGNOSIS — I483 Typical atrial flutter: Secondary | ICD-10-CM | POA: Diagnosis not present

## 2020-05-10 DIAGNOSIS — R001 Bradycardia, unspecified: Secondary | ICD-10-CM | POA: Diagnosis not present

## 2020-05-11 ENCOUNTER — Encounter: Payer: Medicare Other | Admitting: Internal Medicine

## 2020-05-17 NOTE — Patient Instructions (Addendum)
Blood work was ordered.    All other Health Maintenance issues reviewed.   All recommended immunizations and age-appropriate screenings are up-to-date or discussed.  No immunization administered today.   Medications reviewed and updated.  Changes include :   none  Your prescription(s) have been submitted to your pharmacy. Please take as directed and contact our office if you believe you are having problem(s) with the medication(s).    Please followup in 6 months    Health Maintenance, Male Adopting a healthy lifestyle and getting preventive care are important in promoting health and wellness. Ask your health care provider about:  The right schedule for you to have regular tests and exams.  Things you can do on your own to prevent diseases and keep yourself healthy. What should I know about diet, weight, and exercise? Eat a healthy diet   Eat a diet that includes plenty of vegetables, fruits, low-fat dairy products, and lean protein.  Do not eat a lot of foods that are high in solid fats, added sugars, or sodium. Maintain a healthy weight Body mass index (BMI) is a measurement that can be used to identify possible weight problems. It estimates body fat based on height and weight. Your health care provider can help determine your BMI and help you achieve or maintain a healthy weight. Get regular exercise Get regular exercise. This is one of the most important things you can do for your health. Most adults should:  Exercise for at least 150 minutes each week. The exercise should increase your heart rate and make you sweat (moderate-intensity exercise).  Do strengthening exercises at least twice a week. This is in addition to the moderate-intensity exercise.  Spend less time sitting. Even light physical activity can be beneficial. Watch cholesterol and blood lipids Have your blood tested for lipids and cholesterol at 68 years of age, then have this test every 5 years. You may  need to have your cholesterol levels checked more often if:  Your lipid or cholesterol levels are high.  You are older than 68 years of age.  You are at high risk for heart disease. What should I know about cancer screening? Many types of cancers can be detected early and may often be prevented. Depending on your health history and family history, you may need to have cancer screening at various ages. This may include screening for:  Colorectal cancer.  Prostate cancer.  Skin cancer.  Lung cancer. What should I know about heart disease, diabetes, and high blood pressure? Blood pressure and heart disease  High blood pressure causes heart disease and increases the risk of stroke. This is more likely to develop in people who have high blood pressure readings, are of African descent, or are overweight.  Talk with your health care provider about your target blood pressure readings.  Have your blood pressure checked: ? Every 3-5 years if you are 68-104 years of age. ? Every year if you are 28 years old or older.  If you are between the ages of 70 and 74 and are a current or former smoker, ask your health care provider if you should have a one-time screening for abdominal aortic aneurysm (AAA). Diabetes Have regular diabetes screenings. This checks your fasting blood sugar level. Have the screening done:  Once every three years after age 79 if you are at a normal weight and have a low risk for diabetes.  More often and at a younger age if you are overweight or have a  a high risk for diabetes. What should I know about preventing infection? Hepatitis B If you have a higher risk for hepatitis B, you should be screened for this virus. Talk with your health care provider to find out if you are at risk for hepatitis B infection. Hepatitis C Blood testing is recommended for:  Everyone born from 1945 through 1965.  Anyone with known risk factors for hepatitis C. Sexually transmitted  infections (STIs)  You should be screened each year for STIs, including gonorrhea and chlamydia, if: ? You are sexually active and are younger than 68 years of age. ? You are older than 68 years of age and your health care provider tells you that you are at risk for this type of infection. ? Your sexual activity has changed since you were last screened, and you are at increased risk for chlamydia or gonorrhea. Ask your health care provider if you are at risk.  Ask your health care provider about whether you are at high risk for HIV. Your health care provider may recommend a prescription medicine to help prevent HIV infection. If you choose to take medicine to prevent HIV, you should first get tested for HIV. You should then be tested every 3 months for as long as you are taking the medicine. Follow these instructions at home: Lifestyle  Do not use any products that contain nicotine or tobacco, such as cigarettes, e-cigarettes, and chewing tobacco. If you need help quitting, ask your health care provider.  Do not use street drugs.  Do not share needles.  Ask your health care provider for help if you need support or information about quitting drugs. Alcohol use  Do not drink alcohol if your health care provider tells you not to drink.  If you drink alcohol: ? Limit how much you have to 0-2 drinks a day. ? Be aware of how much alcohol is in your drink. In the [place] one drink equals one 12 oz bottle of beer (355 mL), one 5 oz glass of wine (148 mL), or one 1 oz glass of hard liquor (44 mL). General instructions  Schedule regular health, dental, and eye exams.  Stay current with your vaccines.  Tell your health care provider if: ? You often feel depressed. ? You have ever been abused or do not feel safe at home. Summary  Adopting a healthy lifestyle and getting preventive care are important in promoting health and wellness.  Follow your health care provider's instructions about  healthy diet, exercising, and getting tested or screened for diseases.  Follow your health care provider's instructions on monitoring your cholesterol and blood pressure. This information is not intended to replace advice given to you by your health care provider. Make sure you discuss any questions you have with your health care provider. Document Revised: 09/10/2018 Document Reviewed: 09/10/2018 Elsevier Patient Education  2020 [employer]  

## 2020-05-17 NOTE — Progress Notes (Signed)
Subjective:    Patient ID: Richard Trevino, male    DOB: 18-Mar-1951, 69 y.o.   MRN: [id number]  HPI He is here for a physical exam.   He has had his heart shocked three times and it has not worked.  He has an ablation scheduled for 9/2.  Had cancer removed from his nose. hed Moh's surgery.   He son passed away unexpectedly - he found him in his yard and had been there for 5-6 days.   He gets SOB on exertion and very fatigued - cardio thinks it is afib related.    HR 43-129, ox sat 90-94.    Medications and allergies reviewed with patient and updated if appropriate.  Patient Active Problem List   Diagnosis Date Noted  . Grief 05/18/2020  . Obese 11/12/2019  . Atrial flutter ([place]) 11/05/2019  . Leg cramping 10/16/2017  . Depression 07/30/2017  . SKIN CANCER, HX OF 11/07/2009  . Hyperglycemia 11/04/2008  . Hyperlipidemia 09/08/2007  . Essential hypertension 09/08/2007  . GERD (gastroesophageal reflux disease) 09/08/2007  . Hyperplasia of prostate with lower urinary tract symptoms (LUTS) 04/21/2007  . NEPHROLITHIASIS, HX OF 02/25/2007    Current Outpatient Medications on File Prior to Visit  Medication Sig Dispense Refill  . amiodarone (PACERONE) 200 MG tablet     . aspirin 325 MG EC tablet Take 325 mg by mouth daily.    . Calcium Carb-Magnesium Carb (MAGNEBIND 400) 80-115 MG TABS Take 1 tablet by mouth daily.    Marland Kitchen CARTIA XT 120 MG 24 hr capsule Take 120 mg by mouth daily.    . Cholecalciferol (VITAMIN D3) 1000 UNITS CAPS Take by mouth daily.    . Coenzyme Q10 (COQ10) 50 MG CAPS Take by mouth daily.    . Cyanocobalamin (VITAMIN B 12 PO) Take by mouth.    Arne Cleveland 5 MG TABS tablet Take 5 mg by mouth 2 (two) times daily.    . Flaxseed, Linseed, (FLAX SEED OIL) 1000 MG CAPS Take by mouth.    . Glucosamine Sulfate 1000 MG TABS Take by mouth. 2 by mouth daily    . losartan (COZAAR) 25 MG tablet     . Magnesium 400 MG TABS Take by mouth.    . metoprolol tartrate  (LOPRESSOR) 25 MG tablet     . Multiple Vitamin (MULTIVITAMIN) tablet Take 1 tablet by mouth daily.      . ranolazine (RANEXA) 500 MG 12 hr tablet     . VITAMIN E PO Take by mouth.    . Zinc 25 MG TABS Take by mouth.     No current facility-administered medications on file prior to visit.    Past Medical History:  Diagnosis Date  . Fasting hyperglycemia   . GERD (gastroesophageal reflux disease)   . Gilbert's syndrome   . Internal hemorrhoids   . Multiple fracture [id number]   Coccyx, finger,hand  . Rectal bleeding   . Renal calculi    X 2  . Skin cancer    ? Squamous cell; Dr Angie Fava    Past Surgical History:  Procedure Laterality Date  . CHOLECYSTECTOMY  2003   Dr. Joseph Pierini   . COLONOSCOPY  [id number],8416   negative  . LITHOTRIPSY  2007   Dr Barnie Del, [facility]  . POLYPECTOMY    . UPPER GASTROINTESTINAL ENDOSCOPY  2003   Dr Fuller Plan    Social History   Socioeconomic History  . Marital status: Married    Spouse name:  Pamala Hurry  . Number of children: 3  . Years of education: Not on file  . Highest education level: Not on file  Occupational History  . Occupation: truck Education administrator: [employer]  Tobacco Use  . Smoking status: Former Smoker    Quit date: 10/02/1967    Years since quitting: 52.6  . Smokeless tobacco: Never Used  . Tobacco comment: smoked 1968-69, up to 1/2 ppd  Vaping Use  . Vaping Use: Never used  Substance and Sexual Activity  . Alcohol use: No  . Drug use: No  . Sexual activity: Not on file  Other Topics Concern  . Not on file  Social History Narrative  . Not on file   Social Determinants of Health   Financial Resource Strain:   . Difficulty of Paying Living Expenses:   Food Insecurity:   . Worried About Charity fundraiser in the Last Year:   . Arboriculturist in the Last Year:   Transportation Needs:   . Film/video editor (Medical):   Marland Kitchen Lack of Transportation (Non-Medical):   Physical Activity:   . Days of Exercise per  Week:   . Minutes of Exercise per Session:   Stress:   . Feeling of Stress :   Social Connections:   . Frequency of Communication with Friends and Family:   . Frequency of Social Gatherings with Friends and Family:   . Attends Religious Services:   . Active Member of Clubs or Organizations:   . Attends Archivist Meetings:   Marland Kitchen Marital Status:     Family History  Problem Relation Age of Onset  . Emphysema Father   . Hypertension Father   . Stroke Father         < 47  . Hypertension Mother   . Arthritis Mother   . Multiple myeloma Mother   . Skin cancer Brother   . Prostate cancer Brother   . Stroke Brother         > 55  . Depression Brother   . Cirrhosis Brother   . Heart attack Maternal Uncle        X2; both > 55  . Prostate cancer Maternal Uncle   . Diabetes Neg Hx   . Colon cancer Neg Hx   . Colon polyps Neg Hx   . Esophageal cancer Neg Hx   . Stomach cancer Neg Hx   . Rectal cancer Neg Hx     Review of Systems  Constitutional: Positive for fatigue (related to AFib). Negative for fever.  Eyes: Negative for visual disturbance.  Respiratory: Positive for shortness of breath (with exertion). Negative for cough and wheezing.   Cardiovascular: Positive for chest pain and palpitations. Negative for leg swelling.  Gastrointestinal: Negative for abdominal pain, blood in stool, constipation, diarrhea and nausea.       No gerd  Genitourinary: Positive for difficulty urinating (occ). Negative for dysuria and hematuria.  Musculoskeletal: Positive for arthralgias (mild). Negative for back pain.  Skin: Negative for rash.  Neurological: Positive for light-headedness (with aflutter). Negative for headaches.  Psychiatric/Behavioral: Positive for dysphoric mood and sleep disturbance. The patient is not nervous/anxious.        Objective:   Vitals:   05/18/20 1338  BP: 124/82  Pulse: 95  Temp: 98.1 F (36.7 C)  SpO2: 95%   Filed Weights   05/18/20 1338    Weight: 251 lb (113.9 kg)   Body mass index is  35.01 kg/m.  BP Readings from Last 3 Encounters:  05/18/20 124/82  11/12/19 122/80  09/03/19 (!) 126/93    Wt Readings from Last 3 Encounters:  05/18/20 251 lb (113.9 kg)  11/12/19 253 lb (114.8 kg)  09/03/19 258 lb (117 kg)     Physical Exam Constitutional: He appears well-developed and well-nourished. No distress.  HENT:  Head: Normocephalic and atraumatic.  Right Ear: External ear normal.  Left Ear: External ear normal.  Mouth/Throat: Oropharynx is clear and moist.  Normal ear canals and TM b/l  Eyes: Conjunctivae and EOM are normal.  Neck: Neck supple. No tracheal deviation present. No thyromegaly present.  No carotid bruit  Cardiovascular: Normal rate, irregular rhythm, normal heart sounds and intact distal pulses.   No murmur heard. Pulmonary/Chest: Effort normal and breath sounds normal. No respiratory distress. He has no wheezes. He has no rales.  Abdominal: Soft. He exhibits no distension. There is no tenderness.  Genitourinary: deferred  Musculoskeletal: He exhibits no edema.  Lymphadenopathy:   He has no cervical adenopathy.  Skin: Skin is warm and dry. He is not diaphoretic.  Psychiatric: He has a normal mood and affect. His behavior is normal.         Assessment & Plan:   Physical exam: Screening blood work  ordered Immunizations  prevnar deferred, discussed flu, shingrix deferred Colonoscopy   Up to date  Eye exams   Up to date  Exercise   Not able to exercise due to Aflutter Weight  Advised weight loss Substance abuse   none  See Problem List for Assessment and Plan of chronic medical problems.   This visit occurred during the SARS-CoV-2 public health emergency.  Safety protocols were in place, including screening questions prior to the visit, additional usage of staff PPE, and extensive cleaning of exam room while observing appropriate contact time as indicated for disinfecting solutions.

## 2020-05-18 ENCOUNTER — Ambulatory Visit (INDEPENDENT_AMBULATORY_CARE_PROVIDER_SITE_OTHER): Payer: Medicare HMO | Admitting: Internal Medicine

## 2020-05-18 ENCOUNTER — Encounter: Payer: Self-pay | Admitting: Internal Medicine

## 2020-05-18 ENCOUNTER — Other Ambulatory Visit: Payer: Self-pay

## 2020-05-18 VITALS — BP 124/82 | HR 95 | Temp 98.1°F | Ht 71.0 in | Wt 251.0 lb

## 2020-05-18 DIAGNOSIS — Z0000 Encounter for general adult medical examination without abnormal findings: Secondary | ICD-10-CM | POA: Diagnosis not present

## 2020-05-18 DIAGNOSIS — K219 Gastro-esophageal reflux disease without esophagitis: Secondary | ICD-10-CM | POA: Diagnosis not present

## 2020-05-18 DIAGNOSIS — Z125 Encounter for screening for malignant neoplasm of prostate: Secondary | ICD-10-CM | POA: Diagnosis not present

## 2020-05-18 DIAGNOSIS — I4892 Unspecified atrial flutter: Secondary | ICD-10-CM

## 2020-05-18 DIAGNOSIS — R739 Hyperglycemia, unspecified: Secondary | ICD-10-CM

## 2020-05-18 DIAGNOSIS — F4321 Adjustment disorder with depressed mood: Secondary | ICD-10-CM

## 2020-05-18 DIAGNOSIS — F3289 Other specified depressive episodes: Secondary | ICD-10-CM | POA: Diagnosis not present

## 2020-05-18 DIAGNOSIS — I10 Essential (primary) hypertension: Secondary | ICD-10-CM | POA: Diagnosis not present

## 2020-05-18 DIAGNOSIS — Z85828 Personal history of other malignant neoplasm of skin: Secondary | ICD-10-CM

## 2020-05-18 DIAGNOSIS — E7849 Other hyperlipidemia: Secondary | ICD-10-CM

## 2020-05-18 DIAGNOSIS — N401 Enlarged prostate with lower urinary tract symptoms: Secondary | ICD-10-CM | POA: Diagnosis not present

## 2020-05-18 LAB — PSA: PSA: 0.7 ng/mL (ref <=4.0)

## 2020-05-18 MED ORDER — PANTOPRAZOLE SODIUM 40 MG PO TBEC: DELAYED_RELEASE_TABLET | ORAL | 1 refills | Status: DC

## 2020-05-18 MED ORDER — TAMSULOSIN HCL 0.4 MG PO CAPS: 0.4000 mg | ORAL_CAPSULE | Freq: Every day | ORAL | 1 refills | Status: DC

## 2020-05-18 MED ORDER — PRAVASTATIN SODIUM 20 MG PO TABS: ORAL_TABLET | ORAL | 1 refills | Status: DC

## 2020-05-18 MED ORDER — FLUOXETINE HCL 40 MG PO CAPS
40.0000 mg | ORAL_CAPSULE | Freq: Every day | ORAL | 3 refills | Status: DC
Start: 2020-05-18 — End: 2021-07-10

## 2020-05-18 MED ORDER — HYDROCHLOROTHIAZIDE 25 MG PO TABS
12.5000 mg | ORAL_TABLET | Freq: Every day | ORAL | 1 refills | Status: DC
Start: 2020-05-18 — End: 2020-11-23

## 2020-05-18 NOTE — Assessment & Plan Note (Signed)
Chronic BP well controlled Current regimen effective and well tolerated Continue current medications at current doses cmp  

## 2020-05-18 NOTE — Assessment & Plan Note (Signed)
Chronic Check a1c Low sugar / carb diet Stressed regular exercise  

## 2020-05-18 NOTE — Assessment & Plan Note (Addendum)
His son died suddenly Jun 2021 - he found him in his yard dead and had been there for 5-6 days Deferred seeing a therapist Fluoxetine may be helping some-we will continue her current dose.  He does not feel he needs any adjustment at this time

## 2020-05-18 NOTE — Assessment & Plan Note (Signed)
Chronic Controlled, stable Continue current dose of medication - flomax

## 2020-05-18 NOTE — Assessment & Plan Note (Signed)
Sees derm Q 6 months 

## 2020-05-18 NOTE — Assessment & Plan Note (Signed)
Chronic Recently lost his son unexpectedly, but he feels his depression is still controlled Continue fluoxetine at current dose Follow-up in 6 months, sooner if needed

## 2020-05-18 NOTE — Assessment & Plan Note (Signed)
Chronic GERD controlled Continue daily medication  

## 2020-05-18 NOTE — Assessment & Plan Note (Signed)
Chronic Check lipid panel  Continue daily statin Regular exercise and healthy diet encouraged  

## 2020-05-18 NOTE — Assessment & Plan Note (Signed)
Chronic Following with cardiology S/p cardioversion x 3 w/o success Scheduled for ablation Very symptomatic and inactive due to aflutter

## 2020-05-19 LAB — COMPREHENSIVE METABOLIC PANEL
AG Ratio: 2.4 (calc) (ref 1.0–2.5)
ALT: 31 U/L (ref 9–46)
AST: 24 U/L (ref 10–35)
Albumin: 4.6 g/dL (ref 3.6–5.1)
Alkaline phosphatase (APISO): 54 U/L (ref 35–144)
BUN/Creatinine Ratio: 14 (calc) (ref 6–22)
BUN: 18 mg/dL (ref 7–25)
CO2: 25 mmol/L (ref 20–32)
Calcium: 9.5 mg/dL (ref 8.6–10.3)
Chloride: 106 mmol/L (ref 98–110)
Creat: 1.27 mg/dL — ABNORMAL HIGH (ref 0.70–1.25)
Globulin: 1.9 g/dL (calc) (ref 1.9–3.7)
Glucose, Bld: 106 mg/dL — ABNORMAL HIGH (ref 65–99)
Potassium: 3.7 mmol/L (ref 3.5–5.3)
Sodium: 141 mmol/L (ref 135–146)
Total Bilirubin: 1.7 mg/dL — ABNORMAL HIGH (ref 0.2–1.2)
Total Protein: 6.5 g/dL (ref 6.1–8.1)

## 2020-05-19 LAB — TSH: TSH: 2.65 mIU/L (ref 0.40–4.50)

## 2020-05-19 LAB — CBC WITH DIFFERENTIAL/PLATELET
Absolute Monocytes: 403 cells/uL (ref 200–950)
Basophils Absolute: 31 cells/uL (ref 0–200)
Basophils Relative: 0.5 %
Eosinophils Absolute: 128 cells/uL (ref 15–500)
Eosinophils Relative: 2.1 %
HCT: 46.3 % (ref 38.5–50.0)
Hemoglobin: 15.8 g/dL (ref 13.2–17.1)
Lymphs Abs: 1312 cells/uL (ref 850–3900)
MCH: 32.3 pg (ref 27.0–33.0)
MCHC: 34.1 g/dL (ref 32.0–36.0)
MCV: 94.7 fL (ref 80.0–100.0)
MPV: 12.2 fL (ref 7.5–12.5)
Monocytes Relative: 6.6 %
Neutro Abs: 4227 cells/uL (ref 1500–7800)
Neutrophils Relative %: 69.3 %
Platelets: 170 10*3/uL (ref 140–400)
RBC: 4.89 10*6/uL (ref 4.20–5.80)
RDW: 12 % (ref 11.0–15.0)
Total Lymphocyte: 21.5 %
WBC: 6.1 10*3/uL (ref 3.8–10.8)

## 2020-05-19 LAB — LIPID PANEL
Cholesterol: 172 mg/dL (ref <200)
HDL: 35 mg/dL — ABNORMAL LOW (ref >=40)
LDL Cholesterol (Calc): 93 mg/dL (calc)
Non-HDL Cholesterol (Calc): 137 mg/dL (calc) — ABNORMAL HIGH (ref <130)
Total CHOL/HDL Ratio: 4.9 (calc) (ref <5.0)
Triglycerides: 327 mg/dL — ABNORMAL HIGH (ref <150)

## 2020-05-19 LAB — HEMOGLOBIN A1C
Hgb A1c MFr Bld: 5.4 % of total Hgb (ref <5.7)
Mean Plasma Glucose: 108 (calc)
eAG (mmol/L): 6 (calc)

## 2020-05-26 DIAGNOSIS — Z01810 Encounter for preprocedural cardiovascular examination: Secondary | ICD-10-CM | POA: Diagnosis not present

## 2020-05-26 DIAGNOSIS — Z87891 Personal history of nicotine dependence: Secondary | ICD-10-CM | POA: Diagnosis not present

## 2020-05-26 DIAGNOSIS — I4892 Unspecified atrial flutter: Secondary | ICD-10-CM | POA: Diagnosis not present

## 2020-05-26 DIAGNOSIS — Z7901 Long term (current) use of anticoagulants: Secondary | ICD-10-CM | POA: Diagnosis not present

## 2020-05-26 DIAGNOSIS — Z01812 Encounter for preprocedural laboratory examination: Secondary | ICD-10-CM | POA: Diagnosis not present

## 2020-06-02 DIAGNOSIS — Z8679 Personal history of other diseases of the circulatory system: Secondary | ICD-10-CM | POA: Diagnosis not present

## 2020-06-02 DIAGNOSIS — E785 Hyperlipidemia, unspecified: Secondary | ICD-10-CM | POA: Diagnosis not present

## 2020-06-02 DIAGNOSIS — I4892 Unspecified atrial flutter: Secondary | ICD-10-CM | POA: Diagnosis not present

## 2020-06-02 DIAGNOSIS — Z9889 Other specified postprocedural states: Secondary | ICD-10-CM | POA: Diagnosis not present

## 2020-06-02 DIAGNOSIS — I4891 Unspecified atrial fibrillation: Secondary | ICD-10-CM | POA: Diagnosis not present

## 2020-06-02 DIAGNOSIS — I10 Essential (primary) hypertension: Secondary | ICD-10-CM | POA: Diagnosis not present

## 2020-06-02 DIAGNOSIS — Z79899 Other long term (current) drug therapy: Secondary | ICD-10-CM | POA: Diagnosis not present

## 2020-06-02 DIAGNOSIS — Z7901 Long term (current) use of anticoagulants: Secondary | ICD-10-CM | POA: Diagnosis not present

## 2020-06-02 DIAGNOSIS — K219 Gastro-esophageal reflux disease without esophagitis: Secondary | ICD-10-CM | POA: Diagnosis not present

## 2020-06-02 DIAGNOSIS — Z8616 Personal history of COVID-19: Secondary | ICD-10-CM | POA: Diagnosis not present

## 2020-06-02 DIAGNOSIS — Z87891 Personal history of nicotine dependence: Secondary | ICD-10-CM | POA: Diagnosis not present

## 2020-06-03 ENCOUNTER — Telehealth: Payer: Self-pay | Admitting: Internal Medicine

## 2020-06-03 NOTE — Progress Notes (Signed)
  Chronic Care Management   Note  06/03/2020 Name: Richard Trevino MRN: [id number] DOB: 01/12/1951  Richard Trevino is a 69 y.o. year old male who is a primary care patient of Burns, Claudina Lick, MD. I reached out to Richard Trevino by phone today in response to a referral sent by Richard Trevino PCP, Binnie Rail, MD.   Richard Trevino was given information about Chronic Care Management services today including:  1. CCM service includes personalized support from designated clinical staff supervised by his physician, including individualized plan of care and coordination with other care providers 2. 24/7 contact phone numbers for assistance for urgent and routine care needs. 3. Service will only be billed when office clinical staff spend 20 minutes or more in a month to coordinate care. 4. Only one practitioner may furnish and bill the service in a calendar month. 5. The patient may stop CCM services at any time (effective at the end of the month) by phone call to the office staff.   Patient agreed to services and verbal consent obtained.   Follow up plan:   Earney Hamburg Upstream Scheduler

## 2020-07-06 DIAGNOSIS — Z85828 Personal history of other malignant neoplasm of skin: Secondary | ICD-10-CM | POA: Diagnosis not present

## 2020-07-06 DIAGNOSIS — L910 Hypertrophic scar: Secondary | ICD-10-CM | POA: Diagnosis not present

## 2020-07-06 DIAGNOSIS — L570 Actinic keratosis: Secondary | ICD-10-CM | POA: Diagnosis not present

## 2020-07-06 DIAGNOSIS — L578 Other skin changes due to chronic exposure to nonionizing radiation: Secondary | ICD-10-CM | POA: Diagnosis not present

## 2020-07-06 DIAGNOSIS — L821 Other seborrheic keratosis: Secondary | ICD-10-CM | POA: Diagnosis not present

## 2020-07-11 DIAGNOSIS — Z9889 Other specified postprocedural states: Secondary | ICD-10-CM | POA: Diagnosis not present

## 2020-07-11 DIAGNOSIS — Z8679 Personal history of other diseases of the circulatory system: Secondary | ICD-10-CM | POA: Diagnosis not present

## 2020-07-11 DIAGNOSIS — I10 Essential (primary) hypertension: Secondary | ICD-10-CM | POA: Diagnosis not present

## 2020-07-11 DIAGNOSIS — Z7901 Long term (current) use of anticoagulants: Secondary | ICD-10-CM | POA: Diagnosis not present

## 2020-07-11 DIAGNOSIS — I4892 Unspecified atrial flutter: Secondary | ICD-10-CM | POA: Diagnosis not present

## 2020-07-12 DIAGNOSIS — I4892 Unspecified atrial flutter: Secondary | ICD-10-CM | POA: Diagnosis not present

## 2020-08-22 ENCOUNTER — Ambulatory Visit: Payer: Medicare HMO

## 2020-08-22 NOTE — Chronic Care Management (AMB) (Unsigned)
Chronic Care Management Pharmacy  Name: Richard Trevino  MRN: [id number] DOB: May 31, 1951   Chief Complaint/ HPI  Richard Trevino,  69 y.o. , male presents for their Initial CCM visit with the clinical pharmacist via telephone due to COVID-19 Pandemic.  PCP : Richard Rail, MD Patient Care Team: Richard Rail, MD as PCP - General (Internal Medicine) Richard Trevino, [facility] as Pharmacist (Pharmacist)  Their chronic conditions include: Hypertension, Hyperlipidemia, GERD, Depression and BPH, Atrial Flutter  Office Visits: 05/18/20 Dr Richard Trevino:   Consult Visit: 10/11/21PA [facility]): s/p ablation. Consider stopping amiodarone in ~4 months  Allergies  Allergen Reactions  . Bee Venom Anaphylaxis  . Latex Rash  . Niacin     Flushing     Medications: Outpatient Encounter Medications as of 08/22/2020  Medication Sig  . amiodarone (PACERONE) 200 MG tablet   . aspirin 325 MG EC tablet Take 325 mg by mouth daily.  . Calcium Carb-Magnesium Carb (MAGNEBIND 400) 80-115 MG TABS Take 1 tablet by mouth daily.  Marland Kitchen CARTIA XT 120 MG 24 hr capsule Take 120 mg by mouth daily.  . Cholecalciferol (VITAMIN D3) 1000 UNITS CAPS Take by mouth daily.  . Coenzyme Q10 (COQ10) 50 MG CAPS Take by mouth daily.  . Cyanocobalamin (VITAMIN B 12 PO) Take by mouth.  Arne Cleveland 5 MG TABS tablet Take 5 mg by mouth 2 (two) times daily.  . Flaxseed, Linseed, (FLAX SEED OIL) 1000 MG CAPS Take by mouth.  Marland Kitchen FLUoxetine (PROZAC) 40 MG capsule Take 1 capsule (40 mg total) by mouth daily.  . Glucosamine Sulfate 1000 MG TABS Take by mouth. 2 by mouth daily  . hydrochlorothiazide (HYDRODIURIL) 25 MG tablet Take 0.5 tablets (12.5 mg total) by mouth daily.  Marland Kitchen losartan (COZAAR) 25 MG tablet   . Magnesium 400 MG TABS Take by mouth.  . metoprolol tartrate (LOPRESSOR) 25 MG tablet   . Multiple Vitamin (MULTIVITAMIN) tablet Take 1 tablet by mouth daily.    . pantoprazole (PROTONIX) 40 MG tablet TAKE 1  TABLET BY MOUTH DAILY 30 MINUTES BEFORE BREAKFAST  . pravastatin (PRAVACHOL) 20 MG tablet TAKE 1/2 TABET BY MOUTH  NIGHTLY AT BEDTIME  . ranolazine (RANEXA) 500 MG 12 hr tablet   . tamsulosin (FLOMAX) 0.4 MG CAPS capsule Take 1 capsule (0.4 mg total) by mouth daily.  Marland Kitchen VITAMIN E PO Take by mouth.  . Zinc 25 MG TABS Take by mouth.   No facility-administered encounter medications on file as of 08/22/2020.    Wt Readings from Last 3 Encounters:  05/18/20 251 lb (113.9 kg)  11/12/19 253 lb (114.8 kg)  09/03/19 258 lb (117 kg)    Current Diagnosis/Assessment:    Goals Addressed   None     AFIB / Hypertension   Developed A Flutter after COVID pneumonia in 2020 S/p cardioversion x 3 w/o success. S/p ablation 9/2  Patient is currently rhythm controlled. Office heart rates are  Pulse Readings from Last 3 Encounters:  05/18/20 95  11/12/19 72  09/03/19 84   CHA2DS2-VASc Score = 2  The patient's score is based upon: CHF History: 0 HTN History: 1 Diabetes History: 0 Stroke History: 0 Vascular Disease History: 0 Age Score: 1 Gender Score: 0  BP goal is:  <130/80  Office blood pressures are  BP Readings from Last 3 Encounters:  05/18/20 124/82  11/12/19 122/80  09/03/19 (!) 126/93   Patient checks BP at home {CHL HP BP  Monitoring Frequency:[phone]} Patient home BP readings are ranging: ***  Patient has failed these meds in past: *** Patient is currently {CHL Controlled/Uncontrolled:860 702 4154} on the following medications:  . Amiodarone 200 mg BID . Cartia XT 180 mg daily . Metoprolol tartrate 25 mg - 1/2 tab BID . Losartan 25 mg daily . HCTZ 25 mg - 1/2 tab daily . Ranolazine 500 mg BID . Eliquis 5 mg BID  We discussed:  {CHL HP Upstream Pharmacy [phone]}  Plan  Continue {CHL HP Upstream Pharmacy [phone]}  Hyperlipidemia   LDL goal < 100  Last lipids Lab Results  Component Value Date   CHOL 172 05/18/2020   HDL 35 (L)  05/18/2020   LDLCALC 93 05/18/2020   LDLDIRECT 88.0 11/12/2019   TRIG 327 (H) 05/18/2020   CHOLHDL 4.9 05/18/2020   Hepatic Function Latest Ref Rng & Units 05/18/2020 11/12/2019 05/11/2019  Total Protein 6.1 - 8.1 g/dL 6.5 7.1 6.7  Albumin 3.5 - 5.2 g/dL - 4.4 4.6  AST 10 - 35 U/L $Remo'24 22 17  'lkFqo$ ALT 9 - 46 U/L $Remo'31 23 23  'YABap$ Alk Phosphatase 39 - 117 U/L - 64 75  Total Bilirubin 0.2 - 1.2 mg/dL 1.7(H) 1.5(H) 1.6(H)  Bilirubin, Direct 0.0 - 0.3 mg/dL - - -     The 10-year ASCVD risk score Mikey Bussing DC Jr., et al., 2013) is: 21.4%   Values used to calculate the score:     Age: 64 years     Sex: Male     Is Non-Hispanic African American: No     Diabetic: No     Tobacco smoker: No     Systolic Blood Pressure: 725 mmHg     Is BP treated: Yes     HDL Cholesterol: 35 mg/dL     Total Cholesterol: 172 mg/dL   Patient has failed these meds in past: n/a Patient is currently {CHL Controlled/Uncontrolled:860 702 4154} on the following medications:  . Pravastatin 20 mg - 1/2 tab daily HS . Coenzyme Q10 50 mg . Flaxseed oil . Aspirin 325 mg daily  We discussed:  {CHL HP Upstream Pharmacy [phone]}  Plan  Continue {CHL HP Upstream Pharmacy DGUYQ:[id number]}  Depression / Grief   Depression screen [facility] 2/9 05/11/2019 07/30/2017 08/10/2013  Decreased Interest - 0 0  Down, Depressed, Hopeless 0 0 0  PHQ - 2 Score 0 0 0   Patient has failed these meds in past: n/a Patient is currently {CHL Controlled/Uncontrolled:860 702 4154} on the following medications:  . Fluoxetine 40 mg daily  We discussed:  ***  Plan  Continue {CHL HP Upstream Pharmacy LOVFI:[id number]}  GERD   Patient has failed these meds in past: *** Patient is currently {CHL Controlled/Uncontrolled:860 702 4154} on the following medications:  . Pantoprazole 40 mg daily  We discussed:  ***  Plan  Continue {CHL HP Upstream Pharmacy [phone]}  BPH   Lab Results  Component Value Date/Time   PSA 0.7 05/18/2020  02:29 PM   PSA 0.94 05/11/2019 08:49 AM   PSA 0.75 06/13/2015 01:17 PM   Patient has failed these meds in past: *** Patient is currently {CHL Controlled/Uncontrolled:860 702 4154} on the following medications:  . Tamsulosin 0.4 mg daily  We discussed:  ***  Plan  Continue {CHL HP Upstream Pharmacy [phone]}  Health Maintenance   Patient is currently {CHL Controlled/Uncontrolled:860 702 4154} on the following medications:  Marland Kitchen Glucosamine 1000 mg  . Calcium carbonate-Magnesium (Magnebind) . Vitamin D 1000 IU  . Magnesium 400 mg . Multivitamin . Vitamin E . Zinc 25 mg  We discussed:  ***  Plan  Continue {CHL HP Upstream Pharmacy CWUGQ:[id number]}  Vaccines   Reviewed and discussed patient's vaccination history.    Immunization History  Administered Date(s) Administered  . Janssen (J&J) SARS-COV-2 Vaccination 01/13/2020  . Td 03/30/2005  . Tdap 06/20/2015    Plan  Recommended patient receive *** vaccine in *** office.   Medication Management   Patient's preferred pharmacy is:  [facility] [place] - [address] [place] [address] Phone: [phone] Fax: [phone]  Uses pill box? {Yes or If no, why not?:20788} Pt endorses ***% compliance  We discussed: {Pharmacy options:24294}  Plan  {US Pharmacy VVZS:82707}    Follow up: *** month phone visit  ***

## 2020-10-02 ENCOUNTER — Other Ambulatory Visit: Payer: Self-pay | Admitting: Internal Medicine

## 2020-11-16 DIAGNOSIS — Z8679 Personal history of other diseases of the circulatory system: Secondary | ICD-10-CM | POA: Diagnosis not present

## 2020-11-16 DIAGNOSIS — E785 Hyperlipidemia, unspecified: Secondary | ICD-10-CM | POA: Diagnosis not present

## 2020-11-16 DIAGNOSIS — I499 Cardiac arrhythmia, unspecified: Secondary | ICD-10-CM | POA: Diagnosis not present

## 2020-11-16 DIAGNOSIS — Z9889 Other specified postprocedural states: Secondary | ICD-10-CM | POA: Diagnosis not present

## 2020-11-16 DIAGNOSIS — I4892 Unspecified atrial flutter: Secondary | ICD-10-CM | POA: Diagnosis not present

## 2020-11-16 DIAGNOSIS — I10 Essential (primary) hypertension: Secondary | ICD-10-CM | POA: Diagnosis not present

## 2020-11-22 DIAGNOSIS — N183 Chronic kidney disease, stage 3 unspecified: Secondary | ICD-10-CM | POA: Insufficient documentation

## 2020-11-22 DIAGNOSIS — N1831 Chronic kidney disease, stage 3a: Secondary | ICD-10-CM | POA: Insufficient documentation

## 2020-11-22 NOTE — Progress Notes (Signed)
Subjective:    Patient ID: Richard Trevino, male    DOB: [birth year], 70 y.o.   MRN: [id number]  HPI The patient is here for follow up of their chronic medical problems, including htn, hyperlipidemia, GERD, depression, BPH, hyperglycemia   Blood work reviewed in care everywhere   Has gained weight.  Sometimes wheezes with activity.   Gets SOB with exertion.  He is not exercising.   Medications and allergies reviewed with patient and updated if appropriate.  Patient Active Problem List   Diagnosis Date Noted  . CKD (chronic kidney disease) stage 3, GFR 30-59 ml/min (HCC) 11/22/2020  . Grief 05/18/2020  . Obese 11/12/2019  . Atrial flutter ([place]) 11/05/2019  . Leg cramping 10/16/2017  . Depression 07/30/2017  . SKIN CANCER, HX OF 11/07/2009  . Hyperglycemia 11/04/2008  . Hyperlipidemia 09/08/2007  . Essential hypertension 09/08/2007  . GERD (gastroesophageal reflux disease) 09/08/2007  . Hyperplasia of prostate with lower urinary tract symptoms (LUTS) 04/21/2007  . NEPHROLITHIASIS, HX OF 02/25/2007    Current Outpatient Medications on File Prior to Visit  Medication Sig Dispense Refill  . amiodarone (PACERONE) 200 MG tablet Take 200 mg by mouth 2 (two) times daily.     Marland Kitchen aspirin 325 MG EC tablet Take 325 mg by mouth daily.    . Calcium Carb-Magnesium Carb (MAGNEBIND 400) 80-115 MG TABS Take 1 tablet by mouth daily.    . Cholecalciferol (VITAMIN D3) 1000 UNITS CAPS Take by mouth daily.    . Coenzyme Q10 (COQ10) 50 MG CAPS Take by mouth daily.    . Cyanocobalamin (VITAMIN B 12 PO) Take by mouth.    . diltiazem (CARDIZEM CD) 180 MG 24 hr capsule Take 180 mg by mouth daily.    Marland Kitchen ELIQUIS 5 MG TABS tablet Take 5 mg by mouth 2 (two) times daily.    . Flaxseed, Linseed, (FLAX SEED OIL) 1000 MG CAPS Take by mouth.    Marland Kitchen FLUoxetine (PROZAC) 40 MG capsule Take 1 capsule (40 mg total) by mouth daily. 90 capsule 3  . Glucosamine Sulfate 1000 MG TABS Take by mouth. 2 by mouth daily     . hydrochlorothiazide (HYDRODIURIL) 25 MG tablet Take 0.5 tablets (12.5 mg total) by mouth daily. 45 tablet 1  . losartan (COZAAR) 25 MG tablet Take 25 mg by mouth daily.     . Magnesium 400 MG TABS Take by mouth.    . metoprolol tartrate (LOPRESSOR) 25 MG tablet Take 12.5 mg by mouth 2 (two) times daily.     . Multiple Vitamin (MULTIVITAMIN) tablet Take 1 tablet by mouth daily.    . pantoprazole (PROTONIX) 40 MG tablet TAKE 1 TABLET BY MOUTH DAILY 30 MINUTES BEFORE BREAKFAST 90 tablet 1  . pravastatin (PRAVACHOL) 20 MG tablet TAKE 1/2 TABET BY MOUTH  NIGHTLY AT BEDTIME 45 tablet 1  . ranolazine (RANEXA) 500 MG 12 hr tablet Take 500 mg by mouth 2 (two) times daily.     . tamsulosin (FLOMAX) 0.4 MG CAPS capsule TAKE ONE CAPSULE BY MOUTH DAILY 90 capsule 1  . VITAMIN E PO Take by mouth.    . Zinc 25 MG TABS Take by mouth.     No current facility-administered medications on file prior to visit.    Past Medical History:  Diagnosis Date  . Fasting hyperglycemia   . GERD (gastroesophageal reflux disease)   . Gilbert's syndrome   . Internal hemorrhoids   . Multiple fracture [id number]   Coccyx, finger,hand  .  Rectal bleeding   . Renal calculi    X 2  . Skin cancer    ? Squamous cell; Dr Angie Fava    Past Surgical History:  Procedure Laterality Date  . CHOLECYSTECTOMY  2003   Dr. Joseph Pierini   . COLONOSCOPY  2725,3664   negative  . LITHOTRIPSY  2007   Dr Barnie Del, [facility]  . POLYPECTOMY    . UPPER GASTROINTESTINAL ENDOSCOPY  2003   Dr Fuller Plan    Social History   Socioeconomic History  . Marital status: Married    Spouse name: Pamala Hurry  . Number of children: 3  . Years of education: Not on file  . Highest education level: Not on file  Occupational History  . Occupation: truck Education administrator: [employer]  Tobacco Use  . Smoking status: Former Smoker    Quit date: 10/02/1967    Years since quitting: 53.1  . Smokeless tobacco: Never Used  . Tobacco comment: smoked  1968-69, up to 1/2 ppd  Vaping Use  . Vaping Use: Never used  Substance and Sexual Activity  . Alcohol use: No  . Drug use: No  . Sexual activity: Not on file  Other Topics Concern  . Not on file  Social History Narrative  . Not on file   Social Determinants of Health   Financial Resource Strain: Not on file  Food Insecurity: Not on file  Transportation Needs: Not on file  Physical Activity: Not on file  Stress: Not on file  Social Connections: Not on file    Family History  Problem Relation Age of Onset  . Emphysema Father   . Hypertension Father   . Stroke Father         < 43  . Hypertension Mother   . Arthritis Mother   . Multiple myeloma Mother   . Skin cancer Brother   . Prostate cancer Brother   . Stroke Brother         > 55  . Depression Brother   . Cirrhosis Brother   . Heart attack Maternal Uncle        X2; both > 55  . Prostate cancer Maternal Uncle   . Diabetes Neg Hx   . Colon cancer Neg Hx   . Colon polyps Neg Hx   . Esophageal cancer Neg Hx   . Stomach cancer Neg Hx   . Rectal cancer Neg Hx     Review of Systems  Constitutional: Negative for fever.  Respiratory: Positive for shortness of breath (with exertion) and wheezing (with activity). Negative for cough.   Cardiovascular: Negative for chest pain, palpitations and leg swelling.  Neurological: Positive for dizziness (2 episodes). Negative for headaches.       Objective:   Vitals:   11/23/20 1337  BP: 134/76  Pulse: 68  Temp: 97.9 F (36.6 C)  SpO2: 92%   BP Readings from Last 3 Encounters:  11/23/20 134/76  05/18/20 124/82  11/12/19 122/80   Wt Readings from Last 3 Encounters:  11/23/20 258 lb (117 kg)  05/18/20 251 lb (113.9 kg)  11/12/19 253 lb (114.8 kg)   Body mass index is 35.98 kg/m.   Physical Exam    Constitutional: Appears well-developed and well-nourished. No distress.  HENT:  Head: Normocephalic and atraumatic.  Neck: Neck supple. No tracheal deviation  present. No thyromegaly present.  No cervical lymphadenopathy Cardiovascular: Normal rate, regular rhythm and normal heart sounds.   No murmur heard. No carotid bruit .  No edema Pulmonary/Chest: Effort normal and breath sounds normal. No respiratory distress. No has no wheezes. No rales.  Skin: Skin is warm and dry. Not diaphoretic.  Psychiatric: Normal mood and affect. Behavior is normal.      Assessment & Plan:    See Problem List for Assessment and Plan of chronic medical problems.    This visit occurred during the SARS-CoV-2 public health emergency.  Safety protocols were in place, including screening questions prior to the visit, additional usage of staff PPE, and extensive cleaning of exam room while observing appropriate contact time as indicated for disinfecting solutions.

## 2020-11-22 NOTE — Assessment & Plan Note (Addendum)
New Mild GFR last week at cardiology office was > 60 Will check at next visit

## 2020-11-22 NOTE — Patient Instructions (Addendum)
No blood work today.   Medications changes include :  None - you can try taking the pantoprazole every other day    Your prescription(s) have been submitted to your pharmacy. Please take as directed and contact our office if you believe you are having problem(s) with the medication(s).    Please followup in 6 months

## 2020-11-23 ENCOUNTER — Encounter: Payer: Self-pay | Admitting: Internal Medicine

## 2020-11-23 ENCOUNTER — Ambulatory Visit (INDEPENDENT_AMBULATORY_CARE_PROVIDER_SITE_OTHER): Payer: Medicare HMO | Admitting: Internal Medicine

## 2020-11-23 ENCOUNTER — Other Ambulatory Visit: Payer: Self-pay

## 2020-11-23 VITALS — BP 134/76 | HR 68 | Temp 97.9°F | Ht 71.0 in | Wt 258.0 lb

## 2020-11-23 DIAGNOSIS — F3289 Other specified depressive episodes: Secondary | ICD-10-CM | POA: Diagnosis not present

## 2020-11-23 DIAGNOSIS — N1831 Chronic kidney disease, stage 3a: Secondary | ICD-10-CM

## 2020-11-23 DIAGNOSIS — N401 Enlarged prostate with lower urinary tract symptoms: Secondary | ICD-10-CM

## 2020-11-23 DIAGNOSIS — I10 Essential (primary) hypertension: Secondary | ICD-10-CM

## 2020-11-23 DIAGNOSIS — E7849 Other hyperlipidemia: Secondary | ICD-10-CM | POA: Diagnosis not present

## 2020-11-23 DIAGNOSIS — R739 Hyperglycemia, unspecified: Secondary | ICD-10-CM | POA: Diagnosis not present

## 2020-11-23 DIAGNOSIS — K219 Gastro-esophageal reflux disease without esophagitis: Secondary | ICD-10-CM

## 2020-11-23 DIAGNOSIS — R69 Illness, unspecified: Secondary | ICD-10-CM | POA: Diagnosis not present

## 2020-11-23 MED ORDER — PANTOPRAZOLE SODIUM 40 MG PO TBEC
DELAYED_RELEASE_TABLET | ORAL | 1 refills | Status: DC
Start: 2020-11-23 — End: 2021-05-23

## 2020-11-23 MED ORDER — TAMSULOSIN HCL 0.4 MG PO CAPS: 0.4000 mg | ORAL_CAPSULE | Freq: Every day | ORAL | 1 refills | Status: DC

## 2020-11-23 MED ORDER — HYDROCHLOROTHIAZIDE 25 MG PO TABS
12.5000 mg | ORAL_TABLET | Freq: Every day | ORAL | 1 refills | Status: DC
Start: 2020-11-23 — End: 2021-05-23

## 2020-11-23 MED ORDER — PRAVASTATIN SODIUM 20 MG PO TABS: ORAL_TABLET | ORAL | 1 refills | Status: DC

## 2020-11-23 NOTE — Assessment & Plan Note (Signed)
Chronic Controlled, stable Continue flomax 0.4 mg daily  

## 2020-11-23 NOTE — Assessment & Plan Note (Signed)
BMI > 35 with hyperlipidemia, htn Stressed exercise and weight loss

## 2020-11-23 NOTE — Assessment & Plan Note (Signed)
Chronic Low sugar / carb diet Stressed regular exercise   

## 2020-11-23 NOTE — Assessment & Plan Note (Signed)
Chronic GERD controlled Continue protonix 40 mg daily - work on weight loss - ideally would like to taper off medication or get to a lower dose

## 2020-11-23 NOTE — Assessment & Plan Note (Signed)
Chronic °Continue pravastatin 10 mg daily °Regular exercise and healthy diet encouraged ° °

## 2020-11-23 NOTE — Assessment & Plan Note (Signed)
Chronic Controlled, stable Continue prozac 40 mg

## 2020-11-23 NOTE — Assessment & Plan Note (Signed)
Chronic BP well controlled Continue cardizem 180 mg daily, hctz 12.5 mg daily, losartan 25 mg daily, metoprolol 12.5mg  BID cmp

## 2020-11-28 ENCOUNTER — Telehealth: Payer: Self-pay | Admitting: Pharmacist

## 2020-11-28 NOTE — Progress Notes (Signed)
    Chronic Care Management Pharmacy Assistant   Name: Richard Trevino  MRN: [id number] DOB: [birth year]  Reason for Encounter: Chart Review   PCP : Binnie Rail, MD  Allergies:   Allergies  Allergen Reactions  . Bee Venom Anaphylaxis  . Latex Rash  . Niacin     Flushing     Medications: Outpatient Encounter Medications as of 11/28/2020  Medication Sig  . amiodarone (PACERONE) 200 MG tablet Take 200 mg by mouth 2 (two) times daily.   Marland Kitchen aspirin 325 MG EC tablet Take 325 mg by mouth daily.  . Calcium Carb-Magnesium Carb (MAGNEBIND 400) 80-115 MG TABS Take 1 tablet by mouth daily.  . Cholecalciferol (VITAMIN D3) 1000 UNITS CAPS Take by mouth daily.  . Coenzyme Q10 (COQ10) 50 MG CAPS Take by mouth daily.  . Cyanocobalamin (VITAMIN B 12 PO) Take by mouth.  . diltiazem (CARDIZEM CD) 180 MG 24 hr capsule Take 180 mg by mouth daily.  Marland Kitchen ELIQUIS 5 MG TABS tablet Take 5 mg by mouth 2 (two) times daily.  . Flaxseed, Linseed, (FLAX SEED OIL) 1000 MG CAPS Take by mouth.  Marland Kitchen FLUoxetine (PROZAC) 40 MG capsule Take 1 capsule (40 mg total) by mouth daily.  . Glucosamine Sulfate 1000 MG TABS Take by mouth. 2 by mouth daily  . hydrochlorothiazide (HYDRODIURIL) 25 MG tablet Take 0.5 tablets (12.5 mg total) by mouth daily.  Marland Kitchen losartan (COZAAR) 25 MG tablet Take 25 mg by mouth daily.   . Magnesium 400 MG TABS Take by mouth.  . metoprolol tartrate (LOPRESSOR) 25 MG tablet Take 12.5 mg by mouth 2 (two) times daily.   . Multiple Vitamin (MULTIVITAMIN) tablet Take 1 tablet by mouth daily.  . pantoprazole (PROTONIX) 40 MG tablet TAKE 1 TABLET BY MOUTH DAILY 30 MINUTES BEFORE BREAKFAST  . pravastatin (PRAVACHOL) 20 MG tablet TAKE 1/2 TABET BY MOUTH  NIGHTLY AT BEDTIME  . ranolazine (RANEXA) 500 MG 12 hr tablet Take 500 mg by mouth 2 (two) times daily.   . tamsulosin (FLOMAX) 0.4 MG CAPS capsule Take 1 capsule (0.4 mg total) by mouth daily.  Marland Kitchen VITAMIN E PO Take by mouth.  . Zinc 25 MG TABS Take by  mouth.   No facility-administered encounter medications on file as of 11/28/2020.    Current Diagnosis: Patient Active Problem List   Diagnosis Date Noted  . CKD (chronic kidney disease) stage 3, GFR 30-59 ml/min (HCC) 11/22/2020  . Grief 05/18/2020  . Morbid obesity ([place]) 11/05/2019  . Atrial flutter ([place]) 11/05/2019  . Leg cramping 10/16/2017  . Depression 07/30/2017  . SKIN CANCER, HX OF 11/07/2009  . Hyperglycemia 11/04/2008  . Hyperlipidemia 09/08/2007  . Essential hypertension 09/08/2007  . GERD (gastroesophageal reflux disease) 09/08/2007  . Hyperplasia of prostate with lower urinary tract symptoms (LUTS) 04/21/2007  . NEPHROLITHIASIS, HX OF 02/25/2007    Goals Addressed   None     Follow-Up:  Pharmacist Review   Reviewed chart for medication changes and adherence.  No ov,consults, or hospital visits since the last care coordination call with clinical pharmacist  No medication changes indicated  No gaps in adherence identified. Patient has follow up scheduled with pharmacy team. No further action required.   Wendy Poet, [place] [phone]

## 2020-12-27 ENCOUNTER — Encounter: Payer: Self-pay | Admitting: Internal Medicine

## 2021-01-20 ENCOUNTER — Telehealth: Payer: Self-pay | Admitting: Pharmacist

## 2021-01-23 DIAGNOSIS — Z85828 Personal history of other malignant neoplasm of skin: Secondary | ICD-10-CM | POA: Diagnosis not present

## 2021-01-23 DIAGNOSIS — L578 Other skin changes due to chronic exposure to nonionizing radiation: Secondary | ICD-10-CM | POA: Diagnosis not present

## 2021-01-23 DIAGNOSIS — D485 Neoplasm of uncertain behavior of skin: Secondary | ICD-10-CM | POA: Diagnosis not present

## 2021-01-23 DIAGNOSIS — L570 Actinic keratosis: Secondary | ICD-10-CM | POA: Diagnosis not present

## 2021-01-23 DIAGNOSIS — L821 Other seborrheic keratosis: Secondary | ICD-10-CM | POA: Diagnosis not present

## 2021-01-23 DIAGNOSIS — C44311 Basal cell carcinoma of skin of nose: Secondary | ICD-10-CM | POA: Diagnosis not present

## 2021-01-25 NOTE — Progress Notes (Signed)
    Chronic Care Management Pharmacy Assistant   Name: Richard Trevino  MRN: [id number] DOB: [birth year]   Medications: Outpatient Encounter Medications as of 01/20/2021  Medication Sig  . amiodarone (PACERONE) 200 MG tablet Take 200 mg by mouth 2 (two) times daily.   Marland Kitchen aspirin 325 MG EC tablet Take 325 mg by mouth daily.  . Calcium Carb-Magnesium Carb (MAGNEBIND 400) 80-115 MG TABS Take 1 tablet by mouth daily.  . Cholecalciferol (VITAMIN D3) 1000 UNITS CAPS Take by mouth daily.  . Coenzyme Q10 (COQ10) 50 MG CAPS Take by mouth daily.  . Cyanocobalamin (VITAMIN B 12 PO) Take by mouth.  . diltiazem (CARDIZEM CD) 180 MG 24 hr capsule Take 180 mg by mouth daily.  Marland Kitchen ELIQUIS 5 MG TABS tablet Take 5 mg by mouth 2 (two) times daily.  . Flaxseed, Linseed, (FLAX SEED OIL) 1000 MG CAPS Take by mouth.  Marland Kitchen FLUoxetine (PROZAC) 40 MG capsule Take 1 capsule (40 mg total) by mouth daily.  . Glucosamine Sulfate 1000 MG TABS Take by mouth. 2 by mouth daily  . hydrochlorothiazide (HYDRODIURIL) 25 MG tablet Take 0.5 tablets (12.5 mg total) by mouth daily.  Marland Kitchen losartan (COZAAR) 25 MG tablet Take 25 mg by mouth daily.   . Magnesium 400 MG TABS Take by mouth.  . metoprolol tartrate (LOPRESSOR) 25 MG tablet Take 12.5 mg by mouth 2 (two) times daily.   . Multiple Vitamin (MULTIVITAMIN) tablet Take 1 tablet by mouth daily.  . pantoprazole (PROTONIX) 40 MG tablet TAKE 1 TABLET BY MOUTH DAILY 30 MINUTES BEFORE BREAKFAST  . pravastatin (PRAVACHOL) 20 MG tablet TAKE 1/2 TABET BY MOUTH  NIGHTLY AT BEDTIME  . ranolazine (RANEXA) 500 MG 12 hr tablet Take 500 mg by mouth 2 (two) times daily.   . tamsulosin (FLOMAX) 0.4 MG CAPS capsule Take 1 capsule (0.4 mg total) by mouth daily.  Marland Kitchen VITAMIN E PO Take by mouth.  . Zinc 25 MG TABS Take by mouth.   No facility-administered encounter medications on file as of 01/20/2021.    Called patient for a hypertension adherence call,left messages for call back.    Ethelene Hal Clinical Pharmacist Assistant [phone]  Time spent:10

## 2021-02-09 ENCOUNTER — Telehealth: Payer: Self-pay | Admitting: Pharmacist

## 2021-02-09 NOTE — Progress Notes (Addendum)
Chronic Care Management Pharmacy Assistant   Name: Richard Trevino  MRN: [id number] DOB: [birth year]    Reason for Encounter: Disease State Hypertension Call   Conditions to be addressed/monitored: HTN   Recent office visits:  None ID  Recent consult visits:  None ID  Hospital visits:  None in previous 6 months  Medications: Outpatient Encounter Medications as of 02/09/2021  Medication Sig  . amiodarone (PACERONE) 200 MG tablet Take 200 mg by mouth 2 (two) times daily.   Marland Kitchen aspirin 325 MG EC tablet Take 325 mg by mouth daily.  . Calcium Carb-Magnesium Carb (MAGNEBIND 400) 80-115 MG TABS Take 1 tablet by mouth daily.  . Cholecalciferol (VITAMIN D3) 1000 UNITS CAPS Take by mouth daily.  . Coenzyme Q10 (COQ10) 50 MG CAPS Take by mouth daily.  . Cyanocobalamin (VITAMIN B 12 PO) Take by mouth.  . diltiazem (CARDIZEM CD) 180 MG 24 hr capsule Take 180 mg by mouth daily.  Marland Kitchen ELIQUIS 5 MG TABS tablet Take 5 mg by mouth 2 (two) times daily.  . Flaxseed, Linseed, (FLAX SEED OIL) 1000 MG CAPS Take by mouth.  Marland Kitchen FLUoxetine (PROZAC) 40 MG capsule Take 1 capsule (40 mg total) by mouth daily.  . Glucosamine Sulfate 1000 MG TABS Take by mouth. 2 by mouth daily  . hydrochlorothiazide (HYDRODIURIL) 25 MG tablet Take 0.5 tablets (12.5 mg total) by mouth daily.  Marland Kitchen losartan (COZAAR) 25 MG tablet Take 25 mg by mouth daily.   . Magnesium 400 MG TABS Take by mouth.  . metoprolol tartrate (LOPRESSOR) 25 MG tablet Take 12.5 mg by mouth 2 (two) times daily.   . Multiple Vitamin (MULTIVITAMIN) tablet Take 1 tablet by mouth daily.  . pantoprazole (PROTONIX) 40 MG tablet TAKE 1 TABLET BY MOUTH DAILY 30 MINUTES BEFORE BREAKFAST  . pravastatin (PRAVACHOL) 20 MG tablet TAKE 1/2 TABET BY MOUTH  NIGHTLY AT BEDTIME  . ranolazine (RANEXA) 500 MG 12 hr tablet Take 500 mg by mouth 2 (two) times daily.   . tamsulosin (FLOMAX) 0.4 MG CAPS capsule Take 1 capsule (0.4 mg total) by mouth daily.  Marland Kitchen VITAMIN E PO Take  by mouth.  . Zinc 25 MG TABS Take by mouth.   No facility-administered encounter medications on file as of 02/09/2021.    Reviewed chart prior to disease state call. Spoke with patient regarding BP  Recent Office Vitals: BP Readings from Last 3 Encounters:  11/23/20 134/76  05/18/20 124/82  11/12/19 122/80   Pulse Readings from Last 3 Encounters:  11/23/20 68  05/18/20 95  11/12/19 72    Wt Readings from Last 3 Encounters:  11/23/20 258 lb (117 kg)  05/18/20 251 lb (113.9 kg)  11/12/19 253 lb (114.8 kg)     Kidney Function Lab Results  Component Value Date/Time   CREATININE 1.27 (H) 05/18/2020 02:25 PM   CREATININE 1.34 11/12/2019 08:37 AM   CREATININE 1.10 05/11/2019 08:49 AM   GFR 53.10 (L) 11/12/2019 08:37 AM   GFRNONAA 73.22 11/14/2009 01:50 PM   GFRAA 81 04/07/2007 09:11 AM    BMP Latest Ref Rng & Units 05/18/2020 11/12/2019 05/11/2019  Glucose 65 - 99 mg/dL 106(H) 116(H) 115(H)  BUN 7 - 25 mg/dL 18 24(H) 25(H)  Creatinine 0.70 - 1.25 mg/dL 1.27(H) 1.34 1.10  BUN/Creat Ratio 6 - 22 (calc) 14 - -  Sodium 135 - 146 mmol/L 141 139 141  Potassium 3.5 - 5.3 mmol/L 3.7 4.2 4.1  Chloride 98 - 110 mmol/L 106  104 107  CO2 20 - 32 mmol/L 25 27 25   Calcium 8.6 - 10.3 mg/dL 9.5 9.6 9.6    . Current antihypertensive regimen:  Losartan 25 mg daily Metoprolol 25 mg 12.5 mg twice daily  . How often are you checking your Blood Pressure? when feeling symptomatic   . Current home BP readings: Patient wife states that patient last blood pressure was 137/71  . What recent interventions/DTPs have been made by any provider to improve Blood Pressure control since last CPP Visit: None ID  . Any recent hospitalizations or ED visits since last visit with CPP? No   . What diet changes have been made to improve Blood Pressure Control?  Wife states that patient has not made any changes to diet  . What exercise is being done to improve your Blood Pressure Control?  Wife states that  patient has not made any changes to exercise  Adherence Review: Is the patient currently on ACE/ARB medication? Yes Does the patient have >5 day gap between last estimated fill dates? No   Star Rating Drugs: Losartan 01/04/21 90 ds Pravastatin 01/04/21 90 ds  [place] Pharmacist Assistant [phone]  Time spent:30

## 2021-03-29 DIAGNOSIS — C44311 Basal cell carcinoma of skin of nose: Secondary | ICD-10-CM | POA: Diagnosis not present

## 2021-04-05 DIAGNOSIS — L905 Scar conditions and fibrosis of skin: Secondary | ICD-10-CM | POA: Diagnosis not present

## 2021-04-11 ENCOUNTER — Telehealth: Payer: Self-pay | Admitting: Pharmacist

## 2021-04-13 NOTE — Progress Notes (Signed)
    Chronic Care Management Pharmacy Assistant   Name: Richard Trevino  MRN: [id number] DOB: 12-30-1950   Reason for Encounter: Disease State   Conditions to be addressed/monitored: General Call Medications: Outpatient Encounter Medications as of 04/11/2021  Medication Sig   amiodarone (PACERONE) 200 MG tablet Take 200 mg by mouth 2 (two) times daily.    aspirin 325 MG EC tablet Take 325 mg by mouth daily.   Calcium Carb-Magnesium Carb (MAGNEBIND 400) 80-115 MG TABS Take 1 tablet by mouth daily.   Cholecalciferol (VITAMIN D3) 1000 UNITS CAPS Take by mouth daily.   Coenzyme Q10 (COQ10) 50 MG CAPS Take by mouth daily.   Cyanocobalamin (VITAMIN B 12 PO) Take by mouth.   diltiazem (CARDIZEM CD) 180 MG 24 hr capsule Take 180 mg by mouth daily.   ELIQUIS 5 MG TABS tablet Take 5 mg by mouth 2 (two) times daily.   Flaxseed, Linseed, (FLAX SEED OIL) 1000 MG CAPS Take by mouth.   FLUoxetine (PROZAC) 40 MG capsule Take 1 capsule (40 mg total) by mouth daily.   Glucosamine Sulfate 1000 MG TABS Take by mouth. 2 by mouth daily   hydrochlorothiazide (HYDRODIURIL) 25 MG tablet Take 0.5 tablets (12.5 mg total) by mouth daily.   losartan (COZAAR) 25 MG tablet Take 25 mg by mouth daily.    Magnesium 400 MG TABS Take by mouth.   metoprolol tartrate (LOPRESSOR) 25 MG tablet Take 12.5 mg by mouth 2 (two) times daily.    Multiple Vitamin (MULTIVITAMIN) tablet Take 1 tablet by mouth daily.   pantoprazole (PROTONIX) 40 MG tablet TAKE 1 TABLET BY MOUTH DAILY 30 MINUTES BEFORE BREAKFAST   pravastatin (PRAVACHOL) 20 MG tablet TAKE 1/2 TABET BY MOUTH  NIGHTLY AT BEDTIME   ranolazine (RANEXA) 500 MG 12 hr tablet Take 500 mg by mouth 2 (two) times daily.    tamsulosin (FLOMAX) 0.4 MG CAPS capsule Take 1 capsule (0.4 mg total) by mouth daily.   VITAMIN E PO Take by mouth.   Zinc 25 MG TABS Take by mouth.   No facility-administered encounter medications on file as of 04/11/2021.   Pharmacist Review  Made 3  attempts to speak with patient for a general adherence call. Left message for patient to return call, unable to reah patient.  [place] Pharmacist Assistant [phone]   Time spent:17

## 2021-05-20 ENCOUNTER — Other Ambulatory Visit: Payer: Self-pay | Admitting: Internal Medicine

## 2021-05-24 ENCOUNTER — Encounter: Payer: Medicare HMO | Admitting: Internal Medicine

## 2021-05-25 NOTE — Patient Instructions (Addendum)
Blood work was ordered.     Medications changes include :   proscar   Your prescription(s) have been submitted to your pharmacy. Please take as directed and contact our office if you believe you are having problem(s) with the medication(s).     Please followup in 6 months    Health Maintenance, Male Adopting a healthy lifestyle and getting preventive care are important in promoting health and wellness. Ask your health care provider about: The right schedule for you to have regular tests and exams. Things you can do on your own to prevent diseases and keep yourself healthy. What should I know about diet, weight, and exercise? Eat a healthy diet  Eat a diet that includes plenty of vegetables, fruits, low-fat dairy products, and lean protein. Do not eat a lot of foods that are high in solid fats, added sugars, or sodium.  Maintain a healthy weight Body mass index (BMI) is a measurement that can be used to identify possible weight problems. It estimates body fat based on height and weight. Your health care provider can help determine your BMI and help you achieve or maintain ahealthy weight. Get regular exercise Get regular exercise. This is one of the most important things you can do for your health. Most adults should: Exercise for at least 150 minutes each week. The exercise should increase your heart rate and make you sweat (moderate-intensity exercise). Do strengthening exercises at least twice a week. This is in addition to the moderate-intensity exercise. Spend less time sitting. Even light physical activity can be beneficial. Watch cholesterol and blood lipids Have your blood tested for lipids and cholesterol at 69 years of age, then havethis test every 5 years. You may need to have your cholesterol levels checked more often if: Your lipid or cholesterol levels are high. You are older than 69 years of age. You are at high risk for heart disease. What should I know about  cancer screening? Many types of cancers can be detected early and may often be prevented. Depending on your health history and family history, you may need to have cancer screening at various ages. This may include screening for: Colorectal cancer. Prostate cancer. Skin cancer. Lung cancer. What should I know about heart disease, diabetes, and high blood pressure? Blood pressure and heart disease High blood pressure causes heart disease and increases the risk of stroke. This is more likely to develop in people who have high blood pressure readings, are of African descent, or are overweight. Talk with your health care provider about your target blood pressure readings. Have your blood pressure checked: Every 3-5 years if you are 54-35 years of age. Every year if you are 48 years old or older. If you are between the ages of 44 and 10 and are a current or former smoker, ask your health care provider if you should have a one-time screening for abdominal aortic aneurysm (AAA). Diabetes Have regular diabetes screenings. This checks your fasting blood sugar level. Have the screening done: Once every three years after age 29 if you are at a normal weight and have a low risk for diabetes. More often and at a younger age if you are overweight or have a high risk for diabetes. What should I know about preventing infection? Hepatitis B If you have a higher risk for hepatitis B, you should be screened for this virus. Talk with your health care provider to find out if you are at risk forhepatitis B infection. Hepatitis C Blood  testing is recommended for: Everyone born from [birth year] through 1964. Anyone with known risk factors for hepatitis C. Sexually transmitted infections (STIs) You should be screened each year for STIs, including gonorrhea and chlamydia, if: You are sexually active and are younger than 69 years of age. You are older than 69 years of age and your health care provider tells you that you  are at risk for this type of infection. Your sexual activity has changed since you were last screened, and you are at increased risk for chlamydia or gonorrhea. Ask your health care provider if you are at risk. Ask your health care provider about whether you are at high risk for HIV. Your health care provider may recommend a prescription medicine to help prevent HIV infection. If you choose to take medicine to prevent HIV, you should first get tested for HIV. You should then be tested every 3 months for as long as you are taking the medicine. Follow these instructions at home: Lifestyle Do not use any products that contain nicotine or tobacco, such as cigarettes, e-cigarettes, and chewing tobacco. If you need help quitting, ask your health care provider. Do not use street drugs. Do not share needles. Ask your health care provider for help if you need support or information about quitting drugs. Alcohol use Do not drink alcohol if your health care provider tells you not to drink. If you drink alcohol: Limit how much you have to 0-2 drinks a day. Be aware of how much alcohol is in your drink. In the [place] one drink equals one 12 oz bottle of beer (355 mL), one 5 oz glass of wine (148 mL), or one 1 oz glass of hard liquor (44 mL). General instructions Schedule regular health, dental, and eye exams. Stay current with your vaccines. Tell your health care provider if: You often feel depressed. You have ever been abused or do not feel safe at home. Summary Adopting a healthy lifestyle and getting preventive care are important in promoting health and wellness. Follow your health care provider's instructions about healthy diet, exercising, and getting tested or screened for diseases. Follow your health care provider's instructions on monitoring your cholesterol and blood pressure. This information is not intended to replace advice given to you by your health care provider. Make sure you discuss any  questions you have with your healthcare provider. Document Revised: 09/10/2018 Document Reviewed: 09/10/2018 Elsevier Patient Education  2022 [employer].

## 2021-05-25 NOTE — Progress Notes (Signed)
Subjective:    Patient ID: Richard Trevino, male    DOB: 1951-06-12, 70 y.o.   MRN: [id number]   This visit occurred during the SARS-CoV-2 public health emergency.  Safety protocols were in place, including screening questions prior to the visit, additional usage of staff PPE, and extensive cleaning of exam room while observing appropriate contact time as indicated for disinfecting solutions.   HPI He is here for a physical exam.   He urinates a lot - at night and during the day.  He does not think he is emptying his bladder.    Feels left sided chest pain when laying on left side and sometimes when he is exerting himself.  He has an upcoming appointment with his cardiologist and will discuss with him.       Medications and allergies reviewed with patient and updated if appropriate.  Patient Active Problem List   Diagnosis Date Noted   CKD (chronic kidney disease) stage 3, GFR 30-59 ml/min ([place]) 11/22/2020   Grief 05/18/2020   Morbid obesity ([place]) 11/05/2019   Atrial flutter ([place]) 11/05/2019   Leg cramping 10/16/2017   Depression 07/30/2017   SKIN CANCER, HX OF 11/07/2009   Hyperglycemia 11/04/2008   Hyperlipidemia 09/08/2007   Essential hypertension 09/08/2007   GERD (gastroesophageal reflux disease) 09/08/2007   Hyperplasia of prostate with lower urinary tract symptoms (LUTS) 04/21/2007   NEPHROLITHIASIS, HX OF 02/25/2007    Current Outpatient Medications on File Prior to Visit  Medication Sig Dispense Refill   acetaminophen (TYLENOL) 650 MG CR tablet Take 650 mg by mouth every 8 (eight) hours as needed for pain.     aspirin 325 MG EC tablet Take 325 mg by mouth daily.     Biotin 10000 MCG TABS Take by mouth.     Calcium Carb-Magnesium Carb (MAGNEBIND 400) 80-115 MG TABS Take 1 tablet by mouth daily.     Cholecalciferol (VITAMIN D3) 1000 UNITS CAPS Take by mouth daily.     Coenzyme Q10 (COQ10) 50 MG CAPS Take by mouth daily.     Cyanocobalamin (VITAMIN B 12 PO)  Take by mouth.     diltiazem (CARDIZEM CD) 180 MG 24 hr capsule Take 180 mg by mouth daily.     ELIQUIS 5 MG TABS tablet Take 5 mg by mouth 2 (two) times daily.     Flaxseed, Linseed, (FLAX SEED OIL) 1000 MG CAPS Take by mouth.     FLUoxetine (PROZAC) 40 MG capsule Take 1 capsule (40 mg total) by mouth daily. 90 capsule 3   Glucosamine Sulfate 1000 MG TABS Take by mouth. 2 by mouth daily     hydrochlorothiazide (HYDRODIURIL) 25 MG tablet TAKE 1/2 TABLET BY MOUTH DAILY 45 tablet 1   losartan (COZAAR) 25 MG tablet Take 25 mg by mouth daily.      Magnesium 400 MG TABS Take by mouth.     Melatonin 10 MG TABS Take by mouth.     metoprolol tartrate (LOPRESSOR) 25 MG tablet Take 12.5 mg by mouth 2 (two) times daily.      Multiple Vitamin (MULTIVITAMIN) tablet Take 1 tablet by mouth daily.     pantoprazole (PROTONIX) 40 MG tablet TAKE 1 TABLET BY MOUTH DAILY 30 MINUTES BEFORE BREAKFAST 90 tablet 1   pravastatin (PRAVACHOL) 20 MG tablet TAKE 1/2 TABET BY MOUTH  NIGHTLY AT BEDTIME 45 tablet 1   ranolazine (RANEXA) 500 MG 12 hr tablet Take 500 mg by mouth 2 (two) times daily.  tamsulosin (FLOMAX) 0.4 MG CAPS capsule Take 1 capsule (0.4 mg total) by mouth daily. 90 capsule 1   VITAMIN E PO Take by mouth.     Zinc 25 MG TABS Take by mouth.     No current facility-administered medications on file prior to visit.    Past Medical History:  Diagnosis Date   Fasting hyperglycemia    GERD (gastroesophageal reflux disease)    Gilbert's syndrome    Internal hemorrhoids    Multiple fracture [id number]   Coccyx, finger,hand   Rectal bleeding    Renal calculi    X 2   Skin cancer    ? Squamous cell; Dr Angie Fava    Past Surgical History:  Procedure Laterality Date   CHOLECYSTECTOMY  2003   Dr. Joseph Pierini    COLONOSCOPY  0258,5277   negative   LITHOTRIPSY  2007   Dr Barnie Del, [place]   POLYPECTOMY     UPPER GASTROINTESTINAL ENDOSCOPY  2003   Dr Fuller Plan    Social History   Socioeconomic  History   Marital status: Married    Spouse name: Pamala Hurry   Number of children: 3   Years of education: Not on file   Highest education level: Not on file  Occupational History   Occupation: truck Education administrator: [employer]  Tobacco Use   Smoking status: Former    Types: Cigarettes    Quit date: 10/02/1967    Years since quitting: 53.6   Smokeless tobacco: Never   Tobacco comments:    smoked 1968-69, up to 1/2 ppd  Vaping Use   Vaping Use: Never used  Substance and Sexual Activity   Alcohol use: No   Drug use: No   Sexual activity: Not on file  Other Topics Concern   Not on file  Social History Narrative   Not on file   Social Determinants of Health   Financial Resource Strain: Not on file  Food Insecurity: Not on file  Transportation Needs: Not on file  Physical Activity: Not on file  Stress: Not on file  Social Connections: Not on file    Family History  Problem Relation Age of Onset   Emphysema Father    Hypertension Father    Stroke Father         < 51   Hypertension Mother    Arthritis Mother    Multiple myeloma Mother    Skin cancer Brother    Prostate cancer Brother    Stroke Brother         > 21   Depression Brother    Cirrhosis Brother    Heart attack Maternal Uncle        X2; both > 2   Prostate cancer Maternal Uncle    Diabetes Neg Hx    Colon cancer Neg Hx    Colon polyps Neg Hx    Esophageal cancer Neg Hx    Stomach cancer Neg Hx    Rectal cancer Neg Hx     Review of Systems  Constitutional:  Negative for chills and fever.  Eyes:  Negative for visual disturbance.  Respiratory:  Positive for shortness of breath (with exertion) and wheezing (occ). Negative for cough.   Cardiovascular:  Positive for chest pain (occ left sided with laying on left side or exertion). Negative for palpitations and leg swelling.  Gastrointestinal:  Negative for abdominal pain, blood in stool, constipation, diarrhea and nausea.       No gerd  Genitourinary:  Positive for difficulty urinating and frequency. Negative for dysuria.  Musculoskeletal:  Positive for arthralgias. Negative for back pain.  Skin:  Negative for color change and rash.  Neurological:  Negative for dizziness, light-headedness and headaches.  Psychiatric/Behavioral:  Positive for dysphoric mood. The patient is not nervous/anxious.       Objective:   Vitals:   05/26/21 1423  BP: 130/76  Pulse: 66  Temp: 98.4 F (36.9 C)  SpO2: 92%   Filed Weights   05/26/21 1423  Weight: 255 lb (115.7 kg)   Body mass index is 35.57 kg/m.  BP Readings from Last 3 Encounters:  05/26/21 130/76  11/23/20 134/76  05/18/20 124/82    Wt Readings from Last 3 Encounters:  05/26/21 255 lb (115.7 kg)  11/23/20 258 lb (117 kg)  05/18/20 251 lb (113.9 kg)     Physical Exam Constitutional: He appears well-developed and well-nourished. No distress.  HENT:  Head: Normocephalic and atraumatic.  Right Ear: External ear normal.  Left Ear: External ear normal.  Mouth/Throat: Oropharynx is clear and moist.  Normal ear canals and TM b/l  Eyes: Conjunctivae and EOM are normal.  Neck: Neck supple. No tracheal deviation present. No thyromegaly present.  No carotid bruit  Cardiovascular: Normal rate, regular rhythm, normal heart sounds and intact distal pulses.   No murmur heard. Pulmonary/Chest: Effort normal and breath sounds normal. No respiratory distress. He has no wheezes. He has no rales.  Abdominal: Soft. He exhibits no distension. There is no tenderness.  Genitourinary: deferred  Musculoskeletal: He exhibits no edema.  Lymphadenopathy:   He has no cervical adenopathy.  Skin: Skin is warm and dry. He is not diaphoretic.  Psychiatric: He has a normal mood and affect. His behavior is normal.         Assessment & Plan:   Physical exam: Screening blood work  ordered Exercise   some Weight  encouraged weight loss Substance abuse   none   Reviewed  recommended immunizations.   Health Maintenance  Topic Date Due   COVID-19 Vaccine (2 - Janssen risk series) 06/11/2021 (Originally 02/10/2020)   INFLUENZA VACCINE  12/29/2021 (Originally 05/01/2021)   PNA vac Low Risk Adult (1 of 2 - PCV13) 05/26/2054 (Originally 06/10/2017)   Zoster Vaccines- Shingrix (1 of 2) 08/26/2054 (Originally 06/11/1971)   COLONOSCOPY (Pts 45-76yrs Insurance coverage will need to be confirmed)  09/02/2022   TETANUS/TDAP  06/19/2025   Hepatitis C Screening  Completed   HPV VACCINES  Aged Out     See Problem List for Assessment and Plan of chronic medical problems.

## 2021-05-26 ENCOUNTER — Other Ambulatory Visit: Payer: Self-pay

## 2021-05-26 ENCOUNTER — Encounter: Payer: Self-pay | Admitting: Internal Medicine

## 2021-05-26 ENCOUNTER — Ambulatory Visit (INDEPENDENT_AMBULATORY_CARE_PROVIDER_SITE_OTHER): Payer: Medicare HMO | Admitting: Internal Medicine

## 2021-05-26 VITALS — BP 130/76 | HR 66 | Temp 98.4°F | Ht 71.0 in | Wt 255.0 lb

## 2021-05-26 DIAGNOSIS — N1831 Chronic kidney disease, stage 3a: Secondary | ICD-10-CM

## 2021-05-26 DIAGNOSIS — Z0001 Encounter for general adult medical examination with abnormal findings: Secondary | ICD-10-CM

## 2021-05-26 DIAGNOSIS — Z125 Encounter for screening for malignant neoplasm of prostate: Secondary | ICD-10-CM | POA: Diagnosis not present

## 2021-05-26 DIAGNOSIS — I10 Essential (primary) hypertension: Secondary | ICD-10-CM

## 2021-05-26 DIAGNOSIS — R739 Hyperglycemia, unspecified: Secondary | ICD-10-CM

## 2021-05-26 DIAGNOSIS — K219 Gastro-esophageal reflux disease without esophagitis: Secondary | ICD-10-CM

## 2021-05-26 DIAGNOSIS — I4892 Unspecified atrial flutter: Secondary | ICD-10-CM

## 2021-05-26 DIAGNOSIS — E7849 Other hyperlipidemia: Secondary | ICD-10-CM

## 2021-05-26 DIAGNOSIS — Z0000 Encounter for general adult medical examination without abnormal findings: Secondary | ICD-10-CM

## 2021-05-26 DIAGNOSIS — N401 Enlarged prostate with lower urinary tract symptoms: Secondary | ICD-10-CM | POA: Diagnosis not present

## 2021-05-26 DIAGNOSIS — R69 Illness, unspecified: Secondary | ICD-10-CM | POA: Diagnosis not present

## 2021-05-26 DIAGNOSIS — F3289 Other specified depressive episodes: Secondary | ICD-10-CM

## 2021-05-26 LAB — COMPREHENSIVE METABOLIC PANEL
ALT: 39 U/L (ref 0–53)
AST: 42 U/L — ABNORMAL HIGH (ref 0–37)
Albumin: 4.6 g/dL (ref 3.5–5.2)
Alkaline Phosphatase: 79 U/L (ref 39–117)
BUN: 22 mg/dL (ref 6–23)
CO2: 25 mEq/L (ref 19–32)
Calcium: 9.6 mg/dL (ref 8.4–10.5)
Chloride: 104 mEq/L (ref 96–112)
Creatinine, Ser: 1.31 mg/dL (ref 0.40–1.50)
GFR: 55.75 mL/min — ABNORMAL LOW (ref >60.00)
Glucose, Bld: 102 mg/dL — ABNORMAL HIGH (ref 70–99)
Potassium: 4.4 mEq/L (ref 3.5–5.1)
Sodium: 138 mEq/L (ref 135–145)
Total Bilirubin: 2.1 mg/dL — ABNORMAL HIGH (ref 0.2–1.2)
Total Protein: 7.1 g/dL (ref 6.0–8.3)

## 2021-05-26 LAB — CBC WITH DIFFERENTIAL/PLATELET
Basophils Absolute: 0 10*3/uL (ref 0.0–0.1)
Basophils Relative: 0.4 % (ref 0.0–3.0)
Eosinophils Absolute: 0.2 10*3/uL (ref 0.0–0.7)
Eosinophils Relative: 3.1 % (ref 0.0–5.0)
HCT: 45.3 % (ref 39.0–52.0)
Hemoglobin: 15.7 g/dL (ref 13.0–17.0)
Lymphocytes Relative: 29.5 % (ref 12.0–46.0)
Lymphs Abs: 1.6 10*3/uL (ref 0.7–4.0)
MCHC: 34.7 g/dL (ref 30.0–36.0)
MCV: 89.7 fl (ref 78.0–100.0)
Monocytes Absolute: 0.5 10*3/uL (ref 0.1–1.0)
Monocytes Relative: 9.2 % (ref 3.0–12.0)
Neutro Abs: 3.2 10*3/uL (ref 1.4–7.7)
Neutrophils Relative %: 57.8 % (ref 43.0–77.0)
Platelets: 180 10*3/uL (ref 150.0–400.0)
RBC: 5.05 Mil/uL (ref 4.22–5.81)
RDW: 12.7 % (ref 11.5–15.5)
WBC: 5.6 10*3/uL (ref 4.0–10.5)

## 2021-05-26 LAB — LIPID PANEL
Cholesterol: 182 mg/dL (ref 0–200)
HDL: 31.4 mg/dL — ABNORMAL LOW (ref >39.00)
Total CHOL/HDL Ratio: 6
Triglycerides: 712 mg/dL — ABNORMAL HIGH (ref 0.0–149.0)

## 2021-05-26 LAB — LDL CHOLESTEROL, DIRECT: Direct LDL: 90 mg/dL

## 2021-05-26 LAB — PSA, MEDICARE: PSA: 0.63 ng/ml (ref 0.10–4.00)

## 2021-05-26 LAB — HEMOGLOBIN A1C: Hgb A1c MFr Bld: 5.7 % (ref 4.6–6.5)

## 2021-05-26 LAB — TSH: TSH: 2.24 u[IU]/mL (ref 0.35–5.50)

## 2021-05-26 MED ORDER — FINASTERIDE 5 MG PO TABS
5.0000 mg | ORAL_TABLET | Freq: Every day | ORAL | 1 refills | Status: DC
Start: 2021-05-26 — End: 2021-11-24

## 2021-05-26 NOTE — Assessment & Plan Note (Addendum)
Chronic Frequent urination - day and night - not controlled Taking flomax 0.4 mg daily Start proscar 5 mg qd Deferred urology referral - if no improvement in 6 months consider urology

## 2021-05-28 NOTE — Assessment & Plan Note (Signed)
Chronic Check lipid panel  Continue pravastatin 10 mg nightly Regular exercise and healthy diet encouraged

## 2021-05-28 NOTE — Assessment & Plan Note (Signed)
Chronic cmp 

## 2021-05-28 NOTE — Assessment & Plan Note (Signed)
Chronic Check a1c Low sugar / carb diet Stressed regular exercise  

## 2021-05-28 NOTE — Assessment & Plan Note (Signed)
Chronic BP well controlled Continue diltiazem 180 mg qd, hctz 25 mg qd, losartan 25 mgqd, metoprolol 12.5 mg bid cmp

## 2021-05-28 NOTE — Assessment & Plan Note (Signed)
Chronic Controlled, stable Continue fluoxetine '40mg'$  qd

## 2021-05-28 NOTE — Assessment & Plan Note (Signed)
Chronic Asymptomatic Rate controlled On metoprolol, diltiazem and eliquis

## 2021-05-28 NOTE — Assessment & Plan Note (Signed)
Chronic GERD controlled Continue protonix 40 mg qd

## 2021-05-29 ENCOUNTER — Encounter: Payer: Self-pay | Admitting: Internal Medicine

## 2021-05-29 MED ORDER — ICOSAPENT ETHYL 1 G PO CAPS: 2.0000 g | ORAL_CAPSULE | Freq: Two times a day (BID) | ORAL | 5 refills | Status: DC

## 2021-05-29 NOTE — Addendum Note (Signed)
Addended by: Binnie Rail on: 05/29/2021 07:37 AM   Modules accepted: Orders

## 2021-06-03 ENCOUNTER — Telehealth: Payer: Self-pay | Admitting: Internal Medicine

## 2021-06-03 NOTE — Telephone Encounter (Signed)
LVM for pt to rtn my call to schedule AWV with [facility]. Please schedule AWV if pt calls the office  

## 2021-06-12 ENCOUNTER — Telehealth: Payer: Self-pay | Admitting: Pharmacist

## 2021-06-12 NOTE — Progress Notes (Signed)
Chronic Care Management Pharmacy Assistant   Name: Richard Trevino  MRN: [id number] DOB: 1951/03/16   Reason for Encounter: Disease State   Conditions to be addressed/monitored: General  Recent office visits:  05/26/21 Binnie Rail, MD (PCP) Med changes: Start proscar 5 mg qd  06/15/21 Binnie Rail, MD (PCP) Rib injury, Medications changes include :   vicodin 1-2 tabs three times a day as needed. Recent consult visits:  None ID  Hospital visits:  None in previous 6 months  Medications: Outpatient Encounter Medications as of 06/12/2021  Medication Sig   acetaminophen (TYLENOL) 650 MG CR tablet Take 650 mg by mouth every 8 (eight) hours as needed for pain.   aspirin 325 MG EC tablet Take 325 mg by mouth daily.   Biotin 10000 MCG TABS Take by mouth.   Calcium Carb-Magnesium Carb (MAGNEBIND 400) 80-115 MG TABS Take 1 tablet by mouth daily.   Cholecalciferol (VITAMIN D3) 1000 UNITS CAPS Take by mouth daily.   Coenzyme Q10 (COQ10) 50 MG CAPS Take by mouth daily.   Cyanocobalamin (VITAMIN B 12 PO) Take by mouth.   diltiazem (CARDIZEM CD) 180 MG 24 hr capsule Take 180 mg by mouth daily.   ELIQUIS 5 MG TABS tablet Take 5 mg by mouth 2 (two) times daily.   finasteride (PROSCAR) 5 MG tablet Take 1 tablet (5 mg total) by mouth daily.   Flaxseed, Linseed, (FLAX SEED OIL) 1000 MG CAPS Take by mouth.   FLUoxetine (PROZAC) 40 MG capsule Take 1 capsule (40 mg total) by mouth daily.   Glucosamine Sulfate 1000 MG TABS Take by mouth. 2 by mouth daily   hydrochlorothiazide (HYDRODIURIL) 25 MG tablet TAKE 1/2 TABLET BY MOUTH DAILY   icosapent Ethyl (VASCEPA) 1 g capsule Take 2 capsules (2 g total) by mouth 2 (two) times daily.   losartan (COZAAR) 25 MG tablet Take 25 mg by mouth daily.    Magnesium 400 MG TABS Take by mouth.   Melatonin 10 MG TABS Take by mouth.   metoprolol tartrate (LOPRESSOR) 25 MG tablet Take 12.5 mg by mouth 2 (two) times daily.    Multiple Vitamin (MULTIVITAMIN)  tablet Take 1 tablet by mouth daily.   pantoprazole (PROTONIX) 40 MG tablet TAKE 1 TABLET BY MOUTH DAILY 30 MINUTES BEFORE BREAKFAST   pravastatin (PRAVACHOL) 20 MG tablet TAKE 1/2 TABET BY MOUTH  NIGHTLY AT BEDTIME   ranolazine (RANEXA) 500 MG 12 hr tablet Take 500 mg by mouth 2 (two) times daily.    tamsulosin (FLOMAX) 0.4 MG CAPS capsule Take 1 capsule (0.4 mg total) by mouth daily.   VITAMIN E PO Take by mouth.   Zinc 25 MG TABS Take by mouth.   No facility-administered encounter medications on file as of 06/12/2021.    Contacted SASCHA BONDER on 06/23/21 for general disease state and medication adherence call.   Patient is not > 5 days past due for refill on the following medications per chart history:  [facility] Medications: Medication Name/mg Last Fill Days Supply Pravastatin 20 mg  04/03/21  90 Losartan 25 mg  04/03/21  90  What concerns do you have about your medications? Spoke with patient wife who states that he does not have any concerns about medications  The patient denies side effects with his medications.   How often do you forget or accidentally miss a dose? Never  Do you use a pillbox? Yes, wife states that she fills his pill box every week  Are you having any problems getting your medications from your pharmacy? No  Has the cost of your medications been a concern? No If yes, what medication and is patient assistance available or has it been applied for?  Since last visit with CPP, no interventions have been made:   The patient has not had an ED visit since last contact.   The patient denies problems with their health.   he denies  concerns or questions for [employer], Pharm. D at this time.    Care Gaps: Annual wellness visit in last year? No Most Recent BP reading:160/80 06/15/21   PCP appointment on 11/27/21 Dr. Genia Hotter [facility] Clinical Pharmacist Assistant [phone]   Time spent: 68

## 2021-06-14 DIAGNOSIS — L905 Scar conditions and fibrosis of skin: Secondary | ICD-10-CM | POA: Diagnosis not present

## 2021-06-14 NOTE — Progress Notes (Signed)
Subjective:    Patient ID: Richard Trevino, male    DOB: 1950-10-29, 70 y.o.   MRN: [id number]  This visit occurred during the SARS-CoV-2 public health emergency.  Safety protocols were in place, including screening questions prior to the visit, additional usage of staff PPE, and extensive cleaning of exam room while observing appropriate contact time as indicated for disinfecting solutions.    HPI The patient is here for an acute visit.  Rib pain - he fell backwards 3 days ago onto his back and left side.  He has a lot of rib pain.   He has not taken anything for pain.  Increased pain with movement and deep breaths.  No other injuries.     Medications and allergies reviewed with patient and updated if appropriate.  Patient Active Problem List   Diagnosis Date Noted   CKD (chronic kidney disease) stage 3, GFR 30-59 ml/min ([place]) 11/22/2020   Grief 05/18/2020   Morbid obesity ([place]) 11/05/2019   Atrial flutter ([place]) 11/05/2019   Leg cramping 10/16/2017   Depression 07/30/2017   SKIN CANCER, HX OF 11/07/2009   Prediabetes 11/04/2008   Dyslipidemia 09/08/2007   Essential hypertension 09/08/2007   GERD (gastroesophageal reflux disease) 09/08/2007   Hyperplasia of prostate with lower urinary tract symptoms (LUTS) 04/21/2007   NEPHROLITHIASIS, HX OF 02/25/2007    Current Outpatient Medications on File Prior to Visit  Medication Sig Dispense Refill   acetaminophen (TYLENOL) 650 MG CR tablet Take 650 mg by mouth every 8 (eight) hours as needed for pain.     aspirin 325 MG EC tablet Take 325 mg by mouth daily.     Biotin 10000 MCG TABS Take by mouth.     Calcium Carb-Magnesium Carb (MAGNEBIND 400) 80-115 MG TABS Take 1 tablet by mouth daily.     Cholecalciferol (VITAMIN D3) 1000 UNITS CAPS Take by mouth daily.     Coenzyme Q10 (COQ10) 50 MG CAPS Take by mouth daily.     Cyanocobalamin (VITAMIN B 12 PO) Take by mouth.     diltiazem (CARDIZEM CD) 180 MG 24 hr capsule Take 180 mg by  mouth daily.     ELIQUIS 5 MG TABS tablet Take 5 mg by mouth 2 (two) times daily.     finasteride (PROSCAR) 5 MG tablet Take 1 tablet (5 mg total) by mouth daily. 90 tablet 1   Flaxseed, Linseed, (FLAX SEED OIL) 1000 MG CAPS Take by mouth.     FLUoxetine (PROZAC) 40 MG capsule Take 1 capsule (40 mg total) by mouth daily. 90 capsule 3   Glucosamine Sulfate 1000 MG TABS Take by mouth. 2 by mouth daily     hydrochlorothiazide (HYDRODIURIL) 25 MG tablet TAKE 1/2 TABLET BY MOUTH DAILY 45 tablet 1   icosapent Ethyl (VASCEPA) 1 g capsule Take 2 capsules (2 g total) by mouth 2 (two) times daily. 120 capsule 5   losartan (COZAAR) 25 MG tablet Take 25 mg by mouth daily.      Magnesium 400 MG TABS Take by mouth.     Melatonin 10 MG TABS Take by mouth.     metoprolol tartrate (LOPRESSOR) 25 MG tablet Take 12.5 mg by mouth 2 (two) times daily.      Multiple Vitamin (MULTIVITAMIN) tablet Take 1 tablet by mouth daily.     pantoprazole (PROTONIX) 40 MG tablet TAKE 1 TABLET BY MOUTH DAILY 30 MINUTES BEFORE BREAKFAST 90 tablet 1   pravastatin (PRAVACHOL) 20 MG tablet TAKE 1/2  TABET BY MOUTH  NIGHTLY AT BEDTIME 45 tablet 1   ranolazine (RANEXA) 500 MG 12 hr tablet Take 500 mg by mouth 2 (two) times daily.      tamsulosin (FLOMAX) 0.4 MG CAPS capsule Take 1 capsule (0.4 mg total) by mouth daily. 90 capsule 1   VITAMIN E PO Take by mouth.     Zinc 25 MG TABS Take by mouth.     No current facility-administered medications on file prior to visit.    Past Medical History:  Diagnosis Date   Fasting hyperglycemia    GERD (gastroesophageal reflux disease)    Gilbert's syndrome    Internal hemorrhoids    Multiple fracture [id number]   Coccyx, finger,hand   Rectal bleeding    Renal calculi    X 2   Skin cancer    ? Squamous cell; Dr Angie Fava    Past Surgical History:  Procedure Laterality Date   CHOLECYSTECTOMY  2003   Dr. Joseph Pierini    COLONOSCOPY  [id number]   negative   LITHOTRIPSY  2007   Dr Barnie Del,  [place]   POLYPECTOMY     UPPER GASTROINTESTINAL ENDOSCOPY  2003   Dr Fuller Plan    Social History   Socioeconomic History   Marital status: Married    Spouse name: Pamala Hurry   Number of children: 3   Years of education: Not on file   Highest education level: Not on file  Occupational History   Occupation: truck Education administrator: [employer]  Tobacco Use   Smoking status: Former    Types: Cigarettes    Quit date: 10/02/1967    Years since quitting: 53.7   Smokeless tobacco: Never   Tobacco comments:    smoked 1968-69, up to 1/2 ppd  Vaping Use   Vaping Use: Never used  Substance and Sexual Activity   Alcohol use: No   Drug use: No   Sexual activity: Not on file  Other Topics Concern   Not on file  Social History Narrative   Not on file   Social Determinants of Health   Financial Resource Strain: Not on file  Food Insecurity: Not on file  Transportation Needs: Not on file  Physical Activity: Not on file  Stress: Not on file  Social Connections: Not on file    Family History  Problem Relation Age of Onset   Emphysema Father    Hypertension Father    Stroke Father         < 37   Hypertension Mother    Arthritis Mother    Multiple myeloma Mother    Skin cancer Brother    Prostate cancer Brother    Stroke Brother         > 32   Depression Brother    Cirrhosis Brother    Heart attack Maternal Uncle        X2; both > 25   Prostate cancer Maternal Uncle    Diabetes Neg Hx    Colon cancer Neg Hx    Colon polyps Neg Hx    Esophageal cancer Neg Hx    Stomach cancer Neg Hx    Rectal cancer Neg Hx     Review of Systems  Constitutional:  Negative for chills and fever.  Respiratory:  Negative for cough, shortness of breath and wheezing.   Cardiovascular:  Negative for chest pain and palpitations.      Objective:   Vitals:   06/15/21 0752  BP: Marland Kitchen)  160/80  Pulse: 79  Temp: 98.7 F (37.1 C)  SpO2: 94%   BP Readings from Last 3 Encounters:  06/15/21  (!) 160/80  05/26/21 130/76  11/23/20 134/76   Wt Readings from Last 3 Encounters:  06/15/21 259 lb (117.5 kg)  05/26/21 255 lb (115.7 kg)  11/23/20 258 lb (117 kg)   Body mass index is 36.12 kg/m.   Physical Exam Constitutional:      General: He is not in acute distress.    Appearance: Normal appearance. He is not ill-appearing.  Cardiovascular:     Rate and Rhythm: Normal rate and regular rhythm.  Pulmonary:     Effort: No respiratory distress.     Breath sounds: No wheezing or rales.     Comments: Normal effort, but with discomfort in left ribs Musculoskeletal:        General: Tenderness (Tenderness with palpation anterior and lateral left ribs.  No obvious deformity, but area is too tender to palpate well) present.  Skin:    General: Skin is warm and dry.  Neurological:     Mental Status: He is alert.           Assessment & Plan:    See Problem List for Assessment and Plan of chronic medical problems.

## 2021-06-15 ENCOUNTER — Other Ambulatory Visit: Payer: Self-pay

## 2021-06-15 ENCOUNTER — Encounter: Payer: Self-pay | Admitting: Internal Medicine

## 2021-06-15 ENCOUNTER — Ambulatory Visit (INDEPENDENT_AMBULATORY_CARE_PROVIDER_SITE_OTHER): Payer: Medicare HMO

## 2021-06-15 ENCOUNTER — Ambulatory Visit (INDEPENDENT_AMBULATORY_CARE_PROVIDER_SITE_OTHER): Payer: Medicare HMO | Admitting: Internal Medicine

## 2021-06-15 VITALS — BP 160/80 | HR 79 | Temp 98.7°F | Ht 71.0 in | Wt 259.0 lb

## 2021-06-15 DIAGNOSIS — R0781 Pleurodynia: Secondary | ICD-10-CM

## 2021-06-15 MED ORDER — HYDROCODONE-ACETAMINOPHEN 5-325 MG PO TABS: 1.0000 | ORAL_TABLET | Freq: Three times a day (TID) | ORAL | 0 refills | Status: DC | PRN

## 2021-06-15 NOTE — Patient Instructions (Addendum)
  Have an xray done downstairs.   Medications changes include :   vicodin 1-2 tabs three times a day as needed.   Your prescription(s) have been submitted to your pharmacy. Please take as directed and contact our office if you believe you are having problem(s) with the medication(s).

## 2021-06-15 NOTE — Assessment & Plan Note (Signed)
Acute Fell 3 days ago onto his back and left side and is experiencing left rib pain No other injuries ?  Contusion versus rib fracture No symptoms consistent with pneumonia or lung puncture Will get x-ray of ribs and chest Discussed pain control-Will prescribe Vicodin 5-325 mg 1-2 tabs every 8 hours as needed for severe pain.  Can take Tylenol as needed for mild-moderate pain Discussed symptoms to monitor for-symptoms of pneumonia.  Call if pain is not controlled

## 2021-07-08 ENCOUNTER — Other Ambulatory Visit: Payer: Self-pay | Admitting: Internal Medicine

## 2021-07-17 DIAGNOSIS — L821 Other seborrheic keratosis: Secondary | ICD-10-CM | POA: Diagnosis not present

## 2021-07-17 DIAGNOSIS — K219 Gastro-esophageal reflux disease without esophagitis: Secondary | ICD-10-CM | POA: Diagnosis not present

## 2021-07-17 DIAGNOSIS — Z9889 Other specified postprocedural states: Secondary | ICD-10-CM | POA: Diagnosis not present

## 2021-07-17 DIAGNOSIS — I4892 Unspecified atrial flutter: Secondary | ICD-10-CM | POA: Diagnosis not present

## 2021-07-17 DIAGNOSIS — L578 Other skin changes due to chronic exposure to nonionizing radiation: Secondary | ICD-10-CM | POA: Diagnosis not present

## 2021-07-17 DIAGNOSIS — Z85828 Personal history of other malignant neoplasm of skin: Secondary | ICD-10-CM | POA: Diagnosis not present

## 2021-07-17 DIAGNOSIS — L570 Actinic keratosis: Secondary | ICD-10-CM | POA: Diagnosis not present

## 2021-07-17 DIAGNOSIS — L820 Inflamed seborrheic keratosis: Secondary | ICD-10-CM | POA: Diagnosis not present

## 2021-07-17 DIAGNOSIS — I10 Essential (primary) hypertension: Secondary | ICD-10-CM | POA: Diagnosis not present

## 2021-07-17 DIAGNOSIS — Z8679 Personal history of other diseases of the circulatory system: Secondary | ICD-10-CM | POA: Diagnosis not present

## 2021-07-17 DIAGNOSIS — N401 Enlarged prostate with lower urinary tract symptoms: Secondary | ICD-10-CM | POA: Diagnosis not present

## 2021-08-08 DIAGNOSIS — I472 Ventricular tachycardia, unspecified: Secondary | ICD-10-CM | POA: Diagnosis not present

## 2021-08-08 DIAGNOSIS — I4892 Unspecified atrial flutter: Secondary | ICD-10-CM | POA: Diagnosis not present

## 2021-08-08 DIAGNOSIS — I491 Atrial premature depolarization: Secondary | ICD-10-CM | POA: Diagnosis not present

## 2021-09-12 ENCOUNTER — Telehealth: Payer: Self-pay

## 2021-09-12 NOTE — Chronic Care Management (AMB) (Signed)
° ° °  Chronic Care Management Pharmacy Assistant   Name: Richard Trevino  MRN: [id number] DOB: December 31, 1950   Reason for Encounter: Disease State-General Call    Recent office visits:  06/15/21 Binnie Rail, MD-PCP (Rib injury) Order: Odette Horns, Medications changes include :   vicodin 1-2 tabs three times a day as needed.  Recent consult visits:  None ID  Hospital visits:  None in previous 6 months  Medications: Outpatient Encounter Medications as of 09/12/2021  Medication Sig   acetaminophen (TYLENOL) 650 MG CR tablet Take 650 mg by mouth every 8 (eight) hours as needed for pain.   aspirin 325 MG EC tablet Take 325 mg by mouth daily.   Biotin 10000 MCG TABS Take by mouth.   Calcium Carb-Magnesium Carb (MAGNEBIND 400) 80-115 MG TABS Take 1 tablet by mouth daily.   Cholecalciferol (VITAMIN D3) 1000 UNITS CAPS Take by mouth daily.   Coenzyme Q10 (COQ10) 50 MG CAPS Take by mouth daily.   Cyanocobalamin (VITAMIN B 12 PO) Take by mouth.   diltiazem (CARDIZEM CD) 180 MG 24 hr capsule Take 180 mg by mouth daily.   ELIQUIS 5 MG TABS tablet Take 5 mg by mouth 2 (two) times daily.   finasteride (PROSCAR) 5 MG tablet Take 1 tablet (5 mg total) by mouth daily.   Flaxseed, Linseed, (FLAX SEED OIL) 1000 MG CAPS Take by mouth.   FLUoxetine (PROZAC) 40 MG capsule TAKE ONE CAPSULE BY MOUTH DAILY   Glucosamine Sulfate 1000 MG TABS Take by mouth. 2 by mouth daily   hydrochlorothiazide (HYDRODIURIL) 25 MG tablet TAKE 1/2 TABLET BY MOUTH DAILY   HYDROcodone-acetaminophen (NORCO/VICODIN) 5-325 MG tablet Take 1-2 tablets by mouth every 8 (eight) hours as needed for severe pain.   icosapent Ethyl (VASCEPA) 1 g capsule Take 2 capsules (2 g total) by mouth 2 (two) times daily.   losartan (COZAAR) 25 MG tablet Take 25 mg by mouth daily.    Magnesium 400 MG TABS Take by mouth.   Melatonin 10 MG TABS Take by mouth.   metoprolol tartrate (LOPRESSOR) 25 MG tablet Take 12.5 mg by mouth 2 (two) times daily.     Multiple Vitamin (MULTIVITAMIN) tablet Take 1 tablet by mouth daily.   pantoprazole (PROTONIX) 40 MG tablet TAKE 1 TABLET BY MOUTH DAILY 30 MINUTES BEFORE BREAKFAST   pravastatin (PRAVACHOL) 20 MG tablet TAKE 1/2 TABLET BY MOUTH NIGHTLY AT BEDTIME   ranolazine (RANEXA) 500 MG 12 hr tablet Take 500 mg by mouth 2 (two) times daily.    tamsulosin (FLOMAX) 0.4 MG CAPS capsule Take 1 capsule (0.4 mg total) by mouth daily.   VITAMIN E PO Take by mouth.   Zinc 25 MG TABS Take by mouth.   No facility-administered encounter medications on file as of 09/12/2021.   Reviewed chart for medication changes and drug therapy problems ahead of medication adherence call.  Attempted to contact patient x 3 for medication review and health check, unable to reach patient, left voicemails to return call.   Care Gaps: Colonoscopy-09/03/19 Diabetic Foot Exam-NA Ophthalmology-NA Dexa Scan - NA Annual Well Visit - 05/26/21 Micro albumin-NA Hemoglobin A1c- 05/26/21  Star Rating Drugs: Pravastatin 20 mg-last fill 07/10/21 90 ds Losartan 25 mg-last fill 07/10/21 90 ds  Ethelene Hal Clinical Pharmacist Assistant [phone]

## 2021-10-11 ENCOUNTER — Other Ambulatory Visit: Payer: Self-pay | Admitting: Internal Medicine

## 2021-11-13 ENCOUNTER — Telehealth: Payer: Medicare HMO

## 2021-11-13 NOTE — Progress Notes (Unsigned)
Chronic Care Management Pharmacy Note  11/13/2021 Name:  Richard Trevino MRN:  [id number] DOB:  1951/07/27  Summary: ***  Recommendations/Changes made from today's visit: ***  Plan: ***  Subjective: Richard Trevino is an 71 y.o. year old male who is a primary patient of Burns, Claudina Lick, MD.  The CCM team was consulted for assistance with disease management and care coordination needs.    Engaged with patient by telephone for follow up visit in response to provider referral for pharmacy case management and/or care coordination services.   Consent to Services:  The patient was given the following information about Chronic Care Management services today, agreed to services, and gave verbal consent: 1. CCM service includes personalized support from designated clinical staff supervised by the primary care provider, including individualized plan of care and coordination with other care providers 2. 24/7 contact phone numbers for assistance for urgent and routine care needs. 3. Service will only be billed when office clinical staff spend 20 minutes or more in a month to coordinate care. 4. Only one practitioner may furnish and bill the service in a calendar month. 5.The patient may stop CCM services at any time (effective at the end of the month) by phone call to the office staff. 6. The patient will be responsible for cost sharing (co-pay) of up to 20% of the service fee (after annual deductible is met). Patient agreed to services and consent obtained.  Patient Care Team: Binnie Rail, MD as PCP - General (Internal Medicine) Charlton Haws, [facility] as Pharmacist (Pharmacist)  Recent office visits: 06/15/2021 - Dr. Quay Burow - rib pain on left side after a fall - x ray ordered - vicodin rx'd  05/26/2021 - Dr. Quay Burow - frequent urination - prescribed finasteride - deferred urology referral until next OV - f/u in 6 months   Recent consult visits: 07/26/2021 - Dr. Ola Spurr - Cardiology - 7  day zio patch applied 07/17/2021 - Dr. Ola Spurr - Cardiology - plan for zio patch monitoring - possible d/c of eliquis in future    Hospital visits: None in previous 6 months  Objective:  Lab Results  Component Value Date   CREATININE 1.31 05/26/2021   BUN 22 05/26/2021   GFR 55.75 (L) 05/26/2021   GFRNONAA 73.22 11/14/2009   GFRAA 81 04/07/2007   NA 138 05/26/2021   K 4.4 05/26/2021   CALCIUM 9.6 05/26/2021   CO2 25 05/26/2021   GLUCOSE 102 (H) 05/26/2021    Lab Results  Component Value Date/Time   HGBA1C 5.7 05/26/2021 03:36 PM   HGBA1C 5.4 05/18/2020 02:25 PM   GFR 55.75 (L) 05/26/2021 03:36 PM   GFR 53.10 (L) 11/12/2019 08:37 AM   MICROALBUR 0.3 04/21/2007 12:00 AM    Last diabetic Eye exam:  No results found for: HMDIABEYEEXA  Last diabetic Foot exam:  No results found for: HMDIABFOOTEX   Lab Results  Component Value Date   CHOL 182 05/26/2021   HDL 31.40 (L) 05/26/2021   LDLCALC 93 05/18/2020   LDLDIRECT 90.0 05/26/2021   TRIG (H) 05/26/2021    712.0 Triglyceride is over 400; calculations on Lipids are invalid.   CHOLHDL 6 05/26/2021    Hepatic Function Latest Ref Rng & Units 05/26/2021 05/18/2020 11/12/2019  Total Protein 6.0 - 8.3 g/dL 7.1 6.5 7.1  Albumin 3.5 - 5.2 g/dL 4.6 - 4.4  AST 0 - 37 U/L 42(H) 24 22  ALT 0 - 53 U/L 39 31 23  Alk Phosphatase 39 -  117 U/L 79 - 64  Total Bilirubin 0.2 - 1.2 mg/dL 2.1(H) 1.7(H) 1.5(H)  Bilirubin, Direct 0.0 - 0.3 mg/dL - - -    Lab Results  Component Value Date/Time   TSH 2.24 05/26/2021 03:36 PM   TSH 2.65 05/18/2020 02:25 PM    CBC Latest Ref Rng & Units 05/26/2021 05/18/2020 11/12/2019  WBC 4.0 - 10.5 K/uL 5.6 6.1 7.3  Hemoglobin 13.0 - 17.0 g/dL 15.7 15.8 15.2  Hematocrit 39.0 - 52.0 % 45.3 46.3 44.0  Platelets 150.0 - 400.0 K/uL 180.0 170 160.0    No results found for: VD25OH  Clinical ASCVD: {YES/NO:21197} The 10-year ASCVD risk score (Arnett DK, et al., 2019) is: 32.2%   Values used to calculate  the score:     Age: 42 years     Sex: Male     Is Non-Hispanic African American: No     Diabetic: No     Tobacco smoker: No     Systolic Blood Pressure: 867 mmHg     Is BP treated: Yes     HDL Cholesterol: 31.4 mg/dL     Total Cholesterol: 182 mg/dL    Depression screen [facility] 2/9 11/23/2020 05/11/2019 07/30/2017  Decreased Interest 0 - 0  Down, Depressed, Hopeless 0 0 0  PHQ - 2 Score 0 0 0     ***Other: (CHADS2VASc if Afib, MMRC or CAT for COPD, ACT, DEXA)  Social History   Tobacco Use  Smoking Status Former   Types: Cigarettes   Quit date: 10/02/1967   Years since quitting: 54.1  Smokeless Tobacco Never  Tobacco Comments   smoked 1968-69, up to 1/2 ppd   BP Readings from Last 3 Encounters:  06/15/21 (!) 160/80  05/26/21 130/76  11/23/20 134/76   Pulse Readings from Last 3 Encounters:  06/15/21 79  05/26/21 66  11/23/20 68   Wt Readings from Last 3 Encounters:  06/15/21 259 lb (117.5 kg)  05/26/21 255 lb (115.7 kg)  11/23/20 258 lb (117 kg)   BMI Readings from Last 3 Encounters:  06/15/21 36.12 kg/m  05/26/21 35.57 kg/m  11/23/20 35.98 kg/m    Assessment/Interventions: Review of patient past medical history, allergies, medications, health status, including review of consultants reports, laboratory and other test data, was performed as part of comprehensive evaluation and provision of chronic care management services.   SDOH:  (Social Determinants of Health) assessments and interventions performed: {yes/no:20286}  SDOH Screenings   Alcohol Screen: Not on file  Depression (PHQ2-9): Low Risk    PHQ-2 Score: 0  Financial Resource Strain: Not on file  Food Insecurity: Not on file  Housing: Not on file  Physical Activity: Not on file  Social Connections: Not on file  Stress: Not on file  Tobacco Use: Medium Risk   Smoking Tobacco Use: Former   Smokeless Tobacco Use: Never   Passive Exposure: Not on file  Transportation Needs: Not on file    CCM Care  Plan  Allergies  Allergen Reactions   Bee Venom Anaphylaxis   Latex Rash   Niacin     Flushing     Medications Reviewed Today     Reviewed by Binnie Rail, MD (Physician) on 06/15/21 at [place] List Status: <None>   Medication Order Taking? Sig Documenting Provider Last Dose Status Informant  acetaminophen (TYLENOL) 650 MG CR tablet [id number] Yes Take 650 mg by mouth every 8 (eight) hours as needed for pain. [provider] Taking Active   aspirin  325 MG EC tablet [id number] Yes Take 325 mg by mouth daily. [provider] Taking Active   Biotin 48040 MCG TABS [id number] Yes Take by mouth. [provider] Taking Active   Calcium Carb-Magnesium Carb (MAGNEBIND 400) 80-115 MG TABS [id number] Yes Take 1 tablet by mouth daily. [provider] Taking Active   Cholecalciferol (VITAMIN D3) 1000 UNITS CAPS [id number] Yes Take by mouth daily. [provider] Taking Active   Coenzyme Q10 (COQ10) 50 MG CAPS [id number] Yes Take by mouth daily. [provider] Taking Active   Cyanocobalamin (VITAMIN B 12 PO) [id number] Yes Take by mouth. [provider] Taking Active   diltiazem (CARDIZEM CD) 180 MG 24 hr capsule [id number] Yes Take 180 mg by mouth daily. [provider] Taking Active   ELIQUIS 5 MG TABS tablet [id number] Yes Take 5 mg by mouth 2 (two) times daily. [provider] Taking Active   finasteride (PROSCAR) 5 MG tablet [id number] Yes Take 1 tablet (5 mg total) by mouth daily. Pincus Sanes, MD Taking Active   Flaxseed, Linseed, (FLAX SEED OIL) 1000 MG CAPS [id number] Yes Take by mouth. [provider] Taking Active   FLUoxetine (PROZAC) 40 MG capsule [id number] Yes Take 1 capsule (40 mg total) by mouth daily. Pincus Sanes, MD Taking Active   Glucosamine Sulfate 1000 MG TABS [id number] Yes Take by mouth. 2 by mouth daily [provider] Taking Active   hydrochlorothiazide (HYDRODIURIL) 25 MG tablet  [id number] Yes TAKE 1/2 TABLET BY MOUTH DAILY Burns, Bobette Mo, MD Taking Active   icosapent Ethyl (VASCEPA) 1 g capsule [id number] Yes Take 2 capsules (2 g total) by mouth 2 (two) times daily. Pincus Sanes, MD Taking Active   losartan (COZAAR) 25 MG tablet [id number] Yes Take 25 mg by mouth daily.  [provider] Taking Active   Magnesium 400 MG TABS [id number] Yes Take by mouth. [provider] Taking Active   Melatonin 10 MG TABS [id number] Yes Take by mouth. [provider] Taking Active   metoprolol tartrate (LOPRESSOR) 25 MG tablet [id number] Yes Take 12.5 mg by mouth 2 (two) times daily.  [provider] Taking Active   Multiple Vitamin (MULTIVITAMIN) tablet [id number] Yes Take 1 tablet by mouth daily. [provider] Taking Active   pantoprazole (PROTONIX) 40 MG tablet [id number] Yes TAKE 1 TABLET BY MOUTH DAILY 30 MINUTES BEFORE BREAKFAST Burns, Bobette Mo, MD Taking Active   pravastatin (PRAVACHOL) 20 MG tablet [id number] Yes TAKE 1/2 TABET BY MOUTH  NIGHTLY AT BEDTIME Burns, Bobette Mo, MD Taking Active   ranolazine (RANEXA) 500 MG 12 hr tablet [id number] Yes Take 500 mg by mouth 2 (two) times daily.  [provider] Taking Active   tamsulosin (FLOMAX) 0.4 MG CAPS capsule [id number] Yes Take 1 capsule (0.4 mg total) by mouth daily. Pincus Sanes, MD Taking Active   VITAMIN E PO [id number] Yes Take by mouth. [provider] Taking Active   Zinc 25 MG TABS [id number] Yes Take by mouth. [provider] Taking Active             Patient Active Problem List   Diagnosis Date Noted   Rib pain on left side 06/15/2021   CKD (chronic kidney disease) stage 3, GFR 30-59 ml/min (HCC) 11/22/2020   Grief 05/18/2020   Morbid obesity (HCC) 11/05/2019   Atrial flutter (HCC) 11/05/2019   Leg cramping 10/16/2017   Depression 07/30/2017   SKIN CANCER, HX OF 11/07/2009  Prediabetes 11/04/2008   Dyslipidemia 09/08/2007   Essential  hypertension 09/08/2007   GERD (gastroesophageal reflux disease) 09/08/2007   Hyperplasia of prostate with lower urinary tract symptoms (LUTS) 04/21/2007   NEPHROLITHIASIS, HX OF 02/25/2007    Immunization History  Administered Date(s) Administered   Janssen (J&J) SARS-COV-2 Vaccination 01/13/2020   Td 03/30/2005   Tdap 06/20/2015    Conditions to be addressed/monitored:  {USCCMDZASSESSMENTOPTIONS:23563}  There are no care plans that you recently modified to display for this patient.     Medication Assistance: {MEDASSISTANCEINFO:25044}  Compliance/Adherence/Medication fill history: Care Gaps: ***  Patient's preferred pharmacy is:  [id number] - [place], [place] - [address] [place] [address] Phone: [phone] Fax: [phone]   Uses pill box? {Yes or If no, why not?:20788} Pt endorses ***% compliance  Care Plan and Follow Up Patient Decision:  {FOLLOWUP:24991}  Plan: {CM FOLLOW UP PLAN:25073}  ***  Current Barriers:  Unable to independently monitor therapeutic efficacy  Pharmacist Clinical Goal(s):  Patient will achieve adherence to monitoring guidelines and medication adherence to achieve therapeutic efficacy through collaboration with PharmD and provider.   Interventions: 1:1 collaboration with Binnie Rail, MD regarding development and update of comprehensive plan of care as evidenced by provider attestation and co-signature Inter-disciplinary care team collaboration (see longitudinal plan of care) Comprehensive medication review performed; medication list updated in electronic medical record  Hypertension (BP goal <140/90) -{US controlled/uncontrolled:25276} -Current treatment: HCTZ 12.5mg  daily  Diltiazem CD 180mg  daily  Losartan 25mg  daily  Metoprolol Tartrate 12.5mg  BID  -Medications previously tried: amlodipine  -Current home readings: *** BP Readings from Last 3 Encounters:   06/15/21 (!) 160/80  05/26/21 130/76  11/23/20 134/76  -Current dietary habits: *** -Current exercise habits: *** -{ACTIONS;DENIES/REPORTS:21021675::"Denies"} hypotensive/hypertensive symptoms -Educated on {CCM BP Counseling:25124} -Counseled to monitor BP at home ***, document, and provide log at future appointments -{CCMPHARMDINTERVENTION:25122}  Hyperlipidemia: (LDL goal < 70) -Not ideally controlled Lab Results  Component Value Date   [place] 93 05/18/2020  -Current treatment: Pravastatin 10mg  hs  Vascepa 2g BID  ASA 325mg   Ranolazine 500mg  BID  -Medications previously tried: n/a  -Current dietary patterns: *** -Current exercise habits: *** -Educated on {CCM HLD Counseling:25126} -{CCMPHARMDINTERVENTION:25122}  Atrial Flutter (Goal: prevent stroke and major bleeding) -Controlled -CHADSVASC: *** -Current treatment: Rate control: Diltiazem 180mg  daily, Metoprolol Tartrate 12.5mg  BID  Anticoagulation: Eliquis 5mg  BID  -Medications previously tried: amiodarone, amlodipine -Home BP and HR readings: ***  -Counseled on {CCMAFIBCOUNSELING:25120} -{CCMPHARMDINTERVENTION:25122}  Depression/Anxiety (Goal: ***) -Controlled -Current treatment: Fluoxetine 40mg  daily  -Medications previously tried/failed: n/a -Connected with *** for mental health support -Educated on {CCM mental health counseling:25127} -{CCMPHARMDINTERVENTION:25122}  BPH (Goal: prevention / control of urinary symptoms) -Controlled Lab Results  Component Value Date   PSA1 0.7 10/24/2016   PSA 0.63 05/26/2021   PSA 0.7 05/18/2020   PSA 0.94 05/11/2019  -Current treatment  Tamsulosin 0.4mg  daily  Finasteride 5mg  daily  -Medications previously tried: n/a  -{CCMPHARMDINTERVENTION:25122}  Health Maintenance -Vaccine gaps: *** -Current therapy:  *** -Educated on {ccm supplement counseling:25128} -{CCM Patient satisfied:25129} -{CCMPHARMDINTERVENTION:25122}  Patient Goals/Self-Care  Activities Patient will:  - {pharmacypatientgoals:24919}  Follow Up Plan: {CM FOLLOW UP FWYO:37858}

## 2021-11-22 ENCOUNTER — Other Ambulatory Visit: Payer: Self-pay | Admitting: Internal Medicine

## 2021-11-24 ENCOUNTER — Other Ambulatory Visit: Payer: Self-pay | Admitting: Internal Medicine

## 2021-11-26 ENCOUNTER — Encounter: Payer: Self-pay | Admitting: Internal Medicine

## 2021-11-26 NOTE — Patient Instructions (Addendum)
° ° ° °  Blood work was ordered.     Medications changes include :   none   Your prescription(s) have been sent to your pharmacy.    Return in about 6 months (around 05/27/2022) for CPE.

## 2021-11-26 NOTE — Progress Notes (Signed)
Subjective:    Patient ID: Richard Trevino, male    DOB: 09/25/1951, 71 y.o.   MRN: [id number]  This visit occurred during the SARS-CoV-2 public health emergency.  Safety protocols were in place, including screening questions prior to the visit, additional usage of staff PPE, and extensive cleaning of exam room while observing appropriate contact time as indicated for disinfecting solutions.     HPI The patient is here for follow up of their chronic medical problems, including htn, hld, GERD, depression, BPH, prediabetes, CKD  He is having a hard time not eating too much - he needs to learn how to "keep his mouth shut".  He is active with yard work but is not exercising .     Palpitations - in the past couple of weeks.   That is new.  It is transient.  He sees cardiology in June.  He is taking his medication daily.    Medications and allergies reviewed with patient and updated if appropriate.  Patient Active Problem List   Diagnosis Date Noted   Hypercoagulable state ([place]) 11/27/2021   CKD (chronic kidney disease) stage 3, GFR 30-59 ml/min (HCC) 11/22/2020   Grief 05/18/2020   Morbid obesity ([place]) 11/05/2019   Atrial flutter ([place]) 11/05/2019   Leg cramping 10/16/2017   Depression 07/30/2017   SKIN CANCER, HX OF 11/07/2009   Prediabetes 11/04/2008   Dyslipidemia 09/08/2007   Essential hypertension 09/08/2007   GERD (gastroesophageal reflux disease) 09/08/2007   Hyperplasia of prostate with lower urinary tract symptoms (LUTS) 04/21/2007   NEPHROLITHIASIS, HX OF 02/25/2007    Current Outpatient Medications on File Prior to Visit  Medication Sig Dispense Refill   acetaminophen (TYLENOL) 650 MG CR tablet Take 650 mg by mouth every 8 (eight) hours as needed for pain.     aspirin 325 MG EC tablet Take 325 mg by mouth daily.     Biotin 10000 MCG TABS Take by mouth.     Calcium Carb-Magnesium Carb (MAGNEBIND 400) 80-115 MG TABS Take 1 tablet by mouth daily.      Cholecalciferol (VITAMIN D3) 1000 UNITS CAPS Take by mouth daily.     Coenzyme Q10 (COQ10) 50 MG CAPS Take by mouth daily.     Cyanocobalamin (VITAMIN B 12 PO) Take by mouth.     diltiazem (TIAZAC) 180 MG 24 hr capsule Take 1 capsule by mouth daily.     ELIQUIS 5 MG TABS tablet Take 5 mg by mouth 2 (two) times daily.     finasteride (PROSCAR) 5 MG tablet TAKE ONE TABLET BY MOUTH DAILY 90 tablet 1   Flaxseed, Linseed, (FLAX SEED OIL) 1000 MG CAPS Take by mouth.     FLUoxetine (PROZAC) 40 MG capsule TAKE ONE CAPSULE BY MOUTH DAILY 90 capsule 3   Glucosamine Sulfate 1000 MG TABS Take by mouth. 2 by mouth daily     hydrochlorothiazide (HYDRODIURIL) 25 MG tablet TAKE 1/2 TABLET BY MOUTH DAILY 45 tablet 1   icosapent Ethyl (VASCEPA) 1 g capsule Take 2 capsules (2 g total) by mouth 2 (two) times daily. 120 capsule 5   losartan (COZAAR) 25 MG tablet Take 25 mg by mouth daily.      Magnesium 400 MG TABS Take by mouth.     Melatonin 10 MG TABS Take by mouth.     metoprolol tartrate (LOPRESSOR) 25 MG tablet Take 12.5 mg by mouth 2 (two) times daily.      Multiple Vitamin (MULTIVITAMIN) tablet Take  1 tablet by mouth daily.     pantoprazole (PROTONIX) 40 MG tablet TAKE 1 TABLET BY MOUTH DAILY 30 MINUTES BEFORE BREAKFAST 90 tablet 1   pravastatin (PRAVACHOL) 20 MG tablet TAKE 1/2 TABLET BY MOUTH NIGHTLY AT BEDTIME 45 tablet 1   ranolazine (RANEXA) 500 MG 12 hr tablet Take 500 mg by mouth 2 (two) times daily.      tamsulosin (FLOMAX) 0.4 MG CAPS capsule TAKE 1 CAPSULE BY MOUTH DAILY 90 capsule 1   VITAMIN E PO Take by mouth.     Zinc 25 MG TABS Take by mouth.     Cholecalciferol 25 MCG (1000 UT) tablet Take by mouth.     No current facility-administered medications on file prior to visit.    Past Medical History:  Diagnosis Date   Fasting hyperglycemia    GERD (gastroesophageal reflux disease)    Gilbert's syndrome    Internal hemorrhoids    Multiple fracture 1194-1740   Coccyx, finger,hand    Rectal bleeding    Renal calculi    X 2   Skin cancer    ? Squamous cell; Dr Angie Fava    Past Surgical History:  Procedure Laterality Date   CHOLECYSTECTOMY  2003   Dr. Joseph Pierini    COLONOSCOPY  8144,8185   negative   LITHOTRIPSY  2007   Dr Barnie Del, [place]   POLYPECTOMY     UPPER GASTROINTESTINAL ENDOSCOPY  2003   Dr Fuller Plan    Social History   Socioeconomic History   Marital status: Married    Spouse name: Pamala Hurry   Number of children: 3   Years of education: Not on file   Highest education level: Not on file  Occupational History   Occupation: truck Education administrator: [employer]  Tobacco Use   Smoking status: Former    Types: Cigarettes    Quit date: 10/02/1967    Years since quitting: 54.1   Smokeless tobacco: Never   Tobacco comments:    smoked 1968-69, up to 1/2 ppd  Vaping Use   Vaping Use: Never used  Substance and Sexual Activity   Alcohol use: No   Drug use: No   Sexual activity: Not on file  Other Topics Concern   Not on file  Social History Narrative   Not on file   Social Determinants of Health   Financial Resource Strain: Not on file  Food Insecurity: Not on file  Transportation Needs: Not on file  Physical Activity: Not on file  Stress: Not on file  Social Connections: Not on file    Family History  Problem Relation Age of Onset   Emphysema Father    Hypertension Father    Stroke Father         < 44   Hypertension Mother    Arthritis Mother    Multiple myeloma Mother    Skin cancer Brother    Prostate cancer Brother    Stroke Brother         > 61   Depression Brother    Cirrhosis Brother    Heart attack Maternal Uncle        X2; both > 19   Prostate cancer Maternal Uncle    Diabetes Neg Hx    Colon cancer Neg Hx    Colon polyps Neg Hx    Esophageal cancer Neg Hx    Stomach cancer Neg Hx    Rectal cancer Neg Hx     Review of Systems  Constitutional:  Negative for chills and fever.  Respiratory:  Positive for cough,  shortness of breath and wheezing.   Cardiovascular:  Positive for palpitations (occ). Negative for chest pain and leg swelling.  Neurological:  Negative for light-headedness and headaches (rare).      Objective:   Vitals:   11/27/21 1428  BP: 130/80  Pulse: 67  Resp: 16  Temp: 98.3 F (36.8 C)   BP Readings from Last 3 Encounters:  11/27/21 130/80  06/15/21 (!) 160/80  05/26/21 130/76   Wt Readings from Last 3 Encounters:  11/27/21 260 lb (117.9 kg)  06/15/21 259 lb (117.5 kg)  05/26/21 255 lb (115.7 kg)   Body mass index is 36.26 kg/m.   Physical Exam Constitutional:      General: He is not in acute distress.    Appearance: Normal appearance. He is not ill-appearing.  HENT:     Head: Normocephalic and atraumatic.  Eyes:     Conjunctiva/sclera: Conjunctivae normal.  Cardiovascular:     Rate and Rhythm: Normal rate and regular rhythm.     Heart sounds: Normal heart sounds. No murmur heard. Pulmonary:     Effort: Pulmonary effort is normal. No respiratory distress.     Breath sounds: Normal breath sounds. No wheezing or rales.  Musculoskeletal:     Right lower leg: No edema.     Left lower leg: No edema.  Skin:    General: Skin is warm and dry.     Findings: No rash.  Neurological:     Mental Status: He is alert. Mental status is at baseline.  Psychiatric:        Mood and Affect: Mood normal.         Lab Results  Component Value Date   WBC 5.6 05/26/2021   HGB 15.7 05/26/2021   HCT 45.3 05/26/2021   PLT 180.0 05/26/2021   GLUCOSE 102 (H) 05/26/2021   CHOL 182 05/26/2021   TRIG (H) 05/26/2021    712.0 Triglyceride is over 400; calculations on Lipids are invalid.   HDL 31.40 (L) 05/26/2021   LDLDIRECT 90.0 05/26/2021   LDLCALC 93 05/18/2020   ALT 39 05/26/2021   AST 42 (H) 05/26/2021   NA 138 05/26/2021   K 4.4 05/26/2021   CL 104 05/26/2021   CREATININE 1.31 05/26/2021   BUN 22 05/26/2021   CO2 25 05/26/2021   TSH 2.24 05/26/2021   PSA 0.63  05/26/2021   HGBA1C 5.7 05/26/2021   MICROALBUR 0.3 04/21/2007     DG Ribs Unilateral W/Chest Left CLINICAL DATA:  Fall 3 days ago with left rib pain.  EXAM: LEFT RIBS AND CHEST - 3+ VIEW  COMPARISON:  05/26/2020  FINDINGS: No fracture or other bone lesions are seen involving the ribs. There is no evidence of pneumothorax or pleural effusion. Both lungs are clear. Heart size and mediastinal contours are within normal limits.  IMPRESSION: Negative.  Electronically Signed   By: Jorje Guild M.D.   On: 06/15/2021 08:55   Assessment & Plan:    See Problem List for Assessment and Plan of chronic medical problems.

## 2021-11-27 ENCOUNTER — Other Ambulatory Visit: Payer: Self-pay

## 2021-11-27 ENCOUNTER — Ambulatory Visit (INDEPENDENT_AMBULATORY_CARE_PROVIDER_SITE_OTHER): Payer: Medicare Other | Admitting: Internal Medicine

## 2021-11-27 VITALS — BP 130/80 | HR 67 | Temp 98.3°F | Resp 16 | Ht 71.0 in | Wt 260.0 lb

## 2021-11-27 DIAGNOSIS — I10 Essential (primary) hypertension: Secondary | ICD-10-CM | POA: Diagnosis not present

## 2021-11-27 DIAGNOSIS — F3289 Other specified depressive episodes: Secondary | ICD-10-CM

## 2021-11-27 DIAGNOSIS — N1831 Chronic kidney disease, stage 3a: Secondary | ICD-10-CM | POA: Diagnosis not present

## 2021-11-27 DIAGNOSIS — D6859 Other primary thrombophilia: Secondary | ICD-10-CM | POA: Insufficient documentation

## 2021-11-27 DIAGNOSIS — E785 Hyperlipidemia, unspecified: Secondary | ICD-10-CM

## 2021-11-27 DIAGNOSIS — R7303 Prediabetes: Secondary | ICD-10-CM | POA: Diagnosis not present

## 2021-11-27 DIAGNOSIS — N401 Enlarged prostate with lower urinary tract symptoms: Secondary | ICD-10-CM

## 2021-11-27 DIAGNOSIS — I4892 Unspecified atrial flutter: Secondary | ICD-10-CM

## 2021-11-27 LAB — CBC WITH DIFFERENTIAL/PLATELET
Basophils Absolute: 0 10*3/uL (ref 0.0–0.1)
Basophils Relative: 0.6 % (ref 0.0–3.0)
Eosinophils Absolute: 0.2 10*3/uL (ref 0.0–0.7)
Eosinophils Relative: 2.9 % (ref 0.0–5.0)
HCT: 45.7 % (ref 39.0–52.0)
Hemoglobin: 16 g/dL (ref 13.0–17.0)
Lymphocytes Relative: 27.4 % (ref 12.0–46.0)
Lymphs Abs: 1.5 10*3/uL (ref 0.7–4.0)
MCHC: 35.1 g/dL (ref 30.0–36.0)
MCV: 90.7 fl (ref 78.0–100.0)
Monocytes Absolute: 0.5 10*3/uL (ref 0.1–1.0)
Monocytes Relative: 9.4 % (ref 3.0–12.0)
Neutro Abs: 3.2 10*3/uL (ref 1.4–7.7)
Neutrophils Relative %: 59.7 % (ref 43.0–77.0)
Platelets: 163 10*3/uL (ref 150.0–400.0)
RBC: 5.04 Mil/uL (ref 4.22–5.81)
RDW: 12.7 % (ref 11.5–15.5)
WBC: 5.3 10*3/uL (ref 4.0–10.5)

## 2021-11-27 LAB — COMPREHENSIVE METABOLIC PANEL
ALT: 32 U/L (ref 0–53)
AST: 35 U/L (ref 0–37)
Albumin: 4.7 g/dL (ref 3.5–5.2)
Alkaline Phosphatase: 83 U/L (ref 39–117)
BUN: 20 mg/dL (ref 6–23)
CO2: 26 mEq/L (ref 19–32)
Calcium: 9.9 mg/dL (ref 8.4–10.5)
Chloride: 102 mEq/L (ref 96–112)
Creatinine, Ser: 1.22 mg/dL (ref 0.40–1.50)
GFR: 60.51 mL/min (ref >60.00)
Glucose, Bld: 116 mg/dL — ABNORMAL HIGH (ref 70–99)
Potassium: 4.2 mEq/L (ref 3.5–5.1)
Sodium: 136 mEq/L (ref 135–145)
Total Bilirubin: 1.8 mg/dL — ABNORMAL HIGH (ref 0.2–1.2)
Total Protein: 7.1 g/dL (ref 6.0–8.3)

## 2021-11-27 LAB — HEMOGLOBIN A1C: Hgb A1c MFr Bld: 5.8 % (ref 4.6–6.5)

## 2021-11-27 LAB — LIPID PANEL
Cholesterol: 198 mg/dL (ref 0–200)
HDL: 32.1 mg/dL — ABNORMAL LOW (ref >39.00)
Total CHOL/HDL Ratio: 6
Triglycerides: 876 mg/dL — ABNORMAL HIGH (ref 0.0–149.0)

## 2021-11-27 LAB — LDL CHOLESTEROL, DIRECT: Direct LDL: 90 mg/dL

## 2021-11-27 NOTE — Assessment & Plan Note (Signed)
Chronic Controlled, Stable Continue finasteride 5 mg daily, tamsulosin 0.4 mg daily

## 2021-11-27 NOTE — Assessment & Plan Note (Signed)
Chronic Related to a flutter-on Eliquis Continue Eliquis 5 mg twice daily

## 2021-11-27 NOTE — Assessment & Plan Note (Signed)
Chronic Controlled, stable Continue fluoxetine 40 mg daily  

## 2021-11-27 NOTE — Assessment & Plan Note (Signed)
Chronic BP well controlled Continue diltiazem 180 mg daily, hctz 25 mg daily, losartan 25 mg daily, metoprolol 12.5 mg bid cmp

## 2021-11-27 NOTE — Assessment & Plan Note (Signed)
Chronic Regular exercise and healthy diet encouraged Check lipid panel  Continue pravastatin 10 mg daily 

## 2021-11-27 NOTE — Assessment & Plan Note (Signed)
Chronic Sees cardiology Has been having occ palps in the past two weeks, which are very transient He is taking his Eliquis twice daily and his diltiazem and metoprolol Advised him to call cardiology if palpitations increase

## 2021-11-27 NOTE — Assessment & Plan Note (Signed)
Chronic CMP Blood pressure well controlled-continue current medications

## 2021-11-27 NOTE — Assessment & Plan Note (Signed)
Chronic Check a1c Low sugar / carb diet Encouraged regular exercise Discussed cutting back on portions to help with weight loss

## 2021-11-29 ENCOUNTER — Other Ambulatory Visit: Payer: Self-pay | Admitting: Internal Medicine

## 2021-11-29 MED ORDER — ICOSAPENT ETHYL 1 G PO CAPS
2.0000 g | ORAL_CAPSULE | Freq: Two times a day (BID) | ORAL | 5 refills | Status: DC
Start: 2021-11-29 — End: 2022-05-29

## 2021-12-12 ENCOUNTER — Telehealth: Payer: Self-pay | Admitting: Internal Medicine

## 2021-12-12 NOTE — Telephone Encounter (Signed)
N/A unable to leave a message for patient to call back to schedule Medicare Annual Wellness Visit  ? ?No hx of AWV eligible as of 05/10/20 ? ?Please schedule at anytime with [facility] if patient calls the office back.   ? ?40 Minutes appointment  ? ?Any questions, please call me at [phone]  ?

## 2022-01-06 ENCOUNTER — Other Ambulatory Visit: Payer: Self-pay | Admitting: Internal Medicine

## 2022-01-22 DIAGNOSIS — L820 Inflamed seborrheic keratosis: Secondary | ICD-10-CM | POA: Diagnosis not present

## 2022-01-22 DIAGNOSIS — L570 Actinic keratosis: Secondary | ICD-10-CM | POA: Diagnosis not present

## 2022-01-22 DIAGNOSIS — L578 Other skin changes due to chronic exposure to nonionizing radiation: Secondary | ICD-10-CM | POA: Diagnosis not present

## 2022-01-22 DIAGNOSIS — Z85828 Personal history of other malignant neoplasm of skin: Secondary | ICD-10-CM | POA: Diagnosis not present

## 2022-03-07 DIAGNOSIS — I10 Essential (primary) hypertension: Secondary | ICD-10-CM | POA: Diagnosis not present

## 2022-03-07 DIAGNOSIS — I4892 Unspecified atrial flutter: Secondary | ICD-10-CM | POA: Diagnosis not present

## 2022-03-07 DIAGNOSIS — E785 Hyperlipidemia, unspecified: Secondary | ICD-10-CM | POA: Diagnosis not present

## 2022-03-07 DIAGNOSIS — K219 Gastro-esophageal reflux disease without esophagitis: Secondary | ICD-10-CM | POA: Diagnosis not present

## 2022-03-27 ENCOUNTER — Telehealth: Payer: Self-pay | Admitting: Internal Medicine

## 2022-03-27 NOTE — Telephone Encounter (Signed)
LVM for pt to rtn my call to schedule AWV with [facility] call back # [phone] 

## 2022-04-07 ENCOUNTER — Other Ambulatory Visit: Payer: Self-pay | Admitting: Internal Medicine

## 2022-04-20 ENCOUNTER — Ambulatory Visit (INDEPENDENT_AMBULATORY_CARE_PROVIDER_SITE_OTHER): Payer: Medicare Other

## 2022-04-20 DIAGNOSIS — Z0000 Encounter for general adult medical examination without abnormal findings: Secondary | ICD-10-CM

## 2022-04-20 NOTE — Progress Notes (Signed)
Subjective:   Richard Trevino is a 71 y.o. male who presents for an Subsequent  Medicare Annual Wellness Visit.   I connected with Derrin Currey  today by telephone and verified that I am speaking with the correct person using two identifiers. Location patient: home Location provider: work Persons participating in the virtual visit: patient, provider.   I discussed the limitations, risks, security and privacy concerns of performing an evaluation and management service by telephone and the availability of in person appointments. I also discussed with the patient that there may be a patient responsible charge related to this service. The patient expressed understanding and verbally consented to this telephonic visit.    Interactive audio and video telecommunications were attempted between this provider and patient, however failed, due to patient having technical difficulties OR patient did not have access to video capability.  We continued and completed visit with audio only.    Review of Systems    _0 Cardiac Risk Factors include: advanced age (>50mn, >>37women);dyslipidemia;male gender     Objective:    Today's Vitals   There is no height or weight on file to calculate BMI.     04/20/2022    2:36 PM  Advanced Directives  Does Patient Have a Medical Advance Directive? No  Would patient like information on creating a medical advance directive? No - Patient declined    Current Medications (verified) Outpatient Encounter Medications as of 04/20/2022  Medication Sig   acetaminophen (TYLENOL) 650 MG CR tablet Take 650 mg by mouth every 8 (eight) hours as needed for pain.   aspirin 325 MG EC tablet Take 325 mg by mouth daily.   Biotin 10000 MCG TABS Take by mouth.   Calcium Carb-Magnesium Carb (MAGNEBIND 400) 80-115 MG TABS Take 1 tablet by mouth daily.   Cholecalciferol (VITAMIN D3) 1000 UNITS CAPS Take by mouth daily.   Cholecalciferol 25 MCG (1000 UT) tablet Take by mouth.    Coenzyme Q10 (COQ10) 50 MG CAPS Take by mouth daily.   Cyanocobalamin (VITAMIN B 12 PO) Take by mouth.   diltiazem (TIAZAC) 180 MG 24 hr capsule Take 1 capsule by mouth daily.   ELIQUIS 5 MG TABS tablet Take 5 mg by mouth 2 (two) times daily.   finasteride (PROSCAR) 5 MG tablet TAKE ONE TABLET BY MOUTH DAILY   Flaxseed, Linseed, (FLAX SEED OIL) 1000 MG CAPS Take by mouth.   FLUoxetine (PROZAC) 40 MG capsule TAKE ONE CAPSULE BY MOUTH DAILY   Glucosamine Sulfate 1000 MG TABS Take by mouth. 2 by mouth daily   hydrochlorothiazide (HYDRODIURIL) 25 MG tablet TAKE 1/2 TABLET BY MOUTH DAILY   losartan (COZAAR) 25 MG tablet Take 25 mg by mouth daily.    Magnesium 400 MG TABS Take by mouth.   Melatonin 10 MG TABS Take by mouth.   metoprolol tartrate (LOPRESSOR) 25 MG tablet Take 12.5 mg by mouth 2 (two) times daily.    Multiple Vitamin (MULTIVITAMIN) tablet Take 1 tablet by mouth daily.   pantoprazole (PROTONIX) 40 MG tablet TAKE 1 TABLET BY MOUTH DAILY 30 MINUTES BEFORE BREAKFAST   pravastatin (PRAVACHOL) 20 MG tablet TAKE 1/2 TABLET BY MOUTH NIGHTLY AT BEDTIME   ranolazine (RANEXA) 500 MG 12 hr tablet Take 500 mg by mouth 2 (two) times daily.    tamsulosin (FLOMAX) 0.4 MG CAPS capsule TAKE ONE CAPSULE BY MOUTH DAILY   VITAMIN E PO Take by mouth.   Zinc 25 MG TABS Take by mouth.  icosapent Ethyl (VASCEPA) 1 g capsule Take 2 capsules (2 g total) by mouth 2 (two) times daily. (Patient not taking: Reported on 04/20/2022)   No facility-administered encounter medications on file as of 04/20/2022.    Allergies (verified) Bee venom, Latex, and Niacin   History: Past Medical History:  Diagnosis Date   Fasting hyperglycemia    GERD (gastroesophageal reflux disease)    Gilbert's syndrome    Internal hemorrhoids    Multiple fracture [id number]   Coccyx, finger,hand   Rectal bleeding    Renal calculi    X 2   Skin cancer    ? Squamous cell; Dr Angie Fava   Past Surgical History:  Procedure  Laterality Date   CHOLECYSTECTOMY  2003   Dr. Joseph Pierini    COLONOSCOPY  [id number]   negative   LITHOTRIPSY  2007   Dr Barnie Del, [place]   POLYPECTOMY     UPPER GASTROINTESTINAL ENDOSCOPY  2003   Dr Fuller Plan   Family History  Problem Relation Age of Onset   Emphysema Father    Hypertension Father    Stroke Father         < 2   Hypertension Mother    Arthritis Mother    Multiple myeloma Mother    Skin cancer Brother    Prostate cancer Brother    Stroke Brother         > 80   Depression Brother    Cirrhosis Brother    Heart attack Maternal Uncle        X2; both > 51   Prostate cancer Maternal Uncle    Diabetes Neg Hx    Colon cancer Neg Hx    Colon polyps Neg Hx    Esophageal cancer Neg Hx    Stomach cancer Neg Hx    Rectal cancer Neg Hx    Social History   Socioeconomic History   Marital status: Married    Spouse name: Pamala Hurry   Number of children: 3   Years of education: Not on file   Highest education level: Not on file  Occupational History   Occupation: truck Education administrator: [employer]  Tobacco Use   Smoking status: Former    Types: Cigarettes    Quit date: 10/02/1967    Years since quitting: 54.5   Smokeless tobacco: Never   Tobacco comments:    smoked 1968-69, up to 1/2 ppd  Vaping Use   Vaping Use: Never used  Substance and Sexual Activity   Alcohol use: No   Drug use: No   Sexual activity: Not on file  Other Topics Concern   Not on file  Social History Narrative   Not on file   Social Determinants of Health   Financial Resource Strain: Low Risk  (04/20/2022)   Overall Financial Resource Strain (CARDIA)    Difficulty of Paying Living Expenses: Not hard at all  Food Insecurity: No Food Insecurity (04/20/2022)   Hunger Vital Sign    Worried About Running Out of Food in the Last Year: Never true    Ran Out of Food in the Last Year: Never true  Transportation Needs: No Transportation Needs (04/20/2022)   PRAPARE - Radiographer, therapeutic (Medical): No    Lack of Transportation (Non-Medical): No  Physical Activity: Insufficiently Active (04/20/2022)   Exercise Vital Sign    Days of Exercise per Week: 3 days    Minutes of Exercise per Session: 30 min  Stress: No Stress Concern Present (04/20/2022)   [place]    Feeling of Stress : Not at all  Social Connections: Moderately Integrated (04/20/2022)   Social Connection and Isolation Panel [NHANES]    Frequency of Communication with Friends and Family: Three times a week    Frequency of Social Gatherings with Friends and Family: Three times a week    Attends Religious Services: 1 to 4 times per year    Active Member of Clubs or Organizations: No    Attends Archivist Meetings: Never    Marital Status: Married    Tobacco Counseling Counseling given: Not Answered Tobacco comments: smoked 1968-69, up to 1/2 ppd   Clinical Intake:  Pre-visit preparation completed: Yes  Pain : No/denies pain     Nutritional Risks: None Diabetes: No  How often do you need to have someone help you when you read instructions, pamphlets, or other written materials from your doctor or pharmacy?: 1 - Never What is the last grade level you completed in school?: HS  Diabetic?no  Interpreter Needed?: No  Information entered by :: L.Wilson,LPN   Activities of Daily Living    04/20/2022    2:37 PM  In your present state of health, do you have any difficulty performing the following activities:  Hearing? 0  Vision? 0  Difficulty concentrating or making decisions? 0  Walking or climbing stairs? 0  Dressing or bathing? 0  Doing errands, shopping? 0  Preparing Food and eating ? N  Using the Toilet? N  In the past six months, have you accidently leaked urine? N  Do you have problems with loss of bowel control? N  Managing your Medications? N  Managing your Finances? N  Housekeeping or managing  your Housekeeping? N    Patient Care Team: Binnie Rail, MD as PCP - General (Internal Medicine) Charlton Haws, [facility] as Pharmacist (Pharmacist)  Indicate any recent Medical Services you may have received from other than [facility] providers in the past year (date may be approximate).     Assessment:   This is a routine wellness examination for Christofer.  Hearing/Vision screen Vision Screening - Comments:: Annual eye exams wear glasses   Dietary issues and exercise activities discussed: Current Exercise Habits: Home exercise routine, Type of exercise: walking, Time (Minutes): 30, Frequency (Times/Week): 3, Weekly Exercise (Minutes/Week): 90, Intensity: Mild, Exercise limited by: None identified   Goals Addressed   None    Depression Screen    04/20/2022    2:40 PM 04/20/2022    2:37 PM 04/20/2022    2:35 PM 12/08/2021   10:53 AM 11/23/2020    1:42 PM 05/11/2019    8:16 AM 07/30/2017    2:01 PM  PHQ 2/9 Scores  PHQ - 2 Score 0 0 0 0 0 0 0    Fall Risk    04/20/2022    2:37 PM 12/08/2021   10:53 AM 11/23/2020    1:43 PM 11/12/2019    8:20 AM 11/12/2019    7:53 AM  [place] in the past year? 0 1 0 1 1  Number falls in past yr: 0 0 0 0 0  Comment    fell when cutting limb off tree   Injury with Fall? 0 1 0 0   Risk for fall due to :  No Fall Risks No Fall Risks History of fall(s)   Follow up Falls evaluation completed;Education provided  Falls evaluation completed Falls evaluation completed      FALL RISK PREVENTION PERTAINING TO THE HOME:  Any stairs in or around the home? No  If so, are there any without handrails? No  Home free of loose throw rugs in walkways, pet beds, electrical cords, etc? Yes  Adequate lighting in your home to reduce risk of falls? Yes   ASSISTIVE DEVICES UTILIZED TO PREVENT FALLS:  Life alert? No  Use of a cane, walker or w/c? No  Grab bars in the bathroom? No  Shower chair or bench in shower? Yes  Elevated toilet seat or a  handicapped toilet? Yes     Cognitive Function:  Normal cognitive status assessed by telephone conversation  by this Nurse Health Advisor. No abnormalities found.        Immunizations Immunization History  Administered Date(s) Administered   Janssen (J&J) SARS-COV-2 Vaccination 01/13/2020   Td 03/30/2005   Tdap 06/20/2015    TDAP status: Up to date  Flu Vaccine status: Due, Education has been provided regarding the importance of this vaccine. Advised may receive this vaccine at local pharmacy or Health Dept. Aware to provide a copy of the vaccination record if obtained from local pharmacy or Health Dept. Verbalized acceptance and understanding.  Pneumococcal vaccine status: Due, Education has been provided regarding the importance of this vaccine. Advised may receive this vaccine at local pharmacy or Health Dept. Aware to provide a copy of the vaccination record if obtained from local pharmacy or Health Dept. Verbalized acceptance and understanding.  Covid-19 vaccine status: Declined, Education has been provided regarding the importance of this vaccine but patient still declined. Advised may receive this vaccine at local pharmacy or Health Dept.or vaccine clinic. Aware to provide a copy of the vaccination record if obtained from local pharmacy or Health Dept. Verbalized acceptance and understanding.  Qualifies for Shingles Vaccine? Yes   Zostavax completed No   Shingrix Completed?: No.    Education has been provided regarding the importance of this vaccine. Patient has been advised to call insurance company to determine out of pocket expense if they have not yet received this vaccine. Advised may also receive vaccine at local pharmacy or Health Dept. Verbalized acceptance and understanding.  Screening Tests Health Maintenance  Topic Date Due   COVID-19 Vaccine (2 - Janssen risk series) 02/10/2020   Pneumonia Vaccine 38+ Years old (1 - PCV) 11/27/2022 (Originally 06/10/2017)   Zoster  Vaccines- Shingrix (1 of 2) 08/26/2054 (Originally 06/11/1970)   INFLUENZA VACCINE  05/01/2022   COLONOSCOPY (Pts 45-67yr Insurance coverage will need to be confirmed)  09/02/2022   TETANUS/TDAP  06/19/2025   Hepatitis C Screening  Completed   HPV VACCINES  Aged Out    Health Maintenance  Health Maintenance Due  Topic Date Due   COVID-19 Vaccine (2 - Janssen risk series) 02/10/2020    Colorectal cancer screening: Type of screening: Colonoscopy. Completed 09/03/2019. Repeat every 3 years  Lung Cancer Screening: (Low Dose CT Chest recommended if Age 70-80years, 30 pack-year currently smoking OR have quit w/in 15years.) does not qualify.   Lung Cancer Screening Referral: n/a  Additional Screening:  Hepatitis C Screening: does not qualify;   Vision Screening: Recommended annual ophthalmology exams for early detection of glaucoma and other disorders of the eye. Is the patient up to date with their annual eye exam?  Yes  Who is the provider or what is the name of the office in which the patient attends annual eye exams? [facility]  If pt is not established with a provider, would they like to be referred to a provider to establish care? No .   Dental Screening: Recommended annual dental exams for proper oral hygiene  Community Resource Referral / Chronic Care Management: CRR required this visit?  No   CCM required this visit?  No      Plan:     I have personally reviewed and noted the following in the patient's chart:   Medical and social history Use of alcohol, tobacco or illicit drugs  Current medications and supplements including opioid prescriptions. Patient is not currently taking opioid prescriptions. Functional ability and status Nutritional status Physical activity Advanced directives List of other physicians Hospitalizations, surgeries, and ER visits in previous 12 months Vitals Screenings to include cognitive, depression, and falls Referrals and  appointments  In addition, I have reviewed and discussed with patient certain preventive protocols, quality metrics, and best practice recommendations. A written personalized care plan for preventive services as well as general preventive health recommendations were provided to patient.     Randel Pigg, LPN   1/61/0960   Nurse Notes: none

## 2022-04-20 NOTE — Patient Instructions (Signed)
[name] , Thank you for taking time to come for your Medicare Wellness Visit. I appreciate your ongoing commitment to your health goals. Please review the following plan we discussed and let me know if I can assist you in the future.   Screening recommendations/referrals: Colonoscopy: 09/03/2019  due 2023 Recommended yearly ophthalmology/optometry visit for glaucoma screening and checkup Recommended yearly dental visit for hygiene and checkup  Vaccinations: Influenza vaccine: declined  Pneumococcal vaccine: declined  Tdap vaccine: 06/20/2015 Shingles vaccine: declined     Advanced directives: none   Conditions/risks identified: none   Next appointment: none   Preventive Care 70 Years and Older, Male Preventive care refers to lifestyle choices and visits with your health care provider that can promote health and wellness. What does preventive care include? A yearly physical exam. This is also called an annual well check. Dental exams once or twice a year. Routine eye exams. Ask your health care provider how often you should have your eyes checked. Personal lifestyle choices, including: Daily care of your teeth and gums. Regular physical activity. Eating a healthy diet. Avoiding tobacco and drug use. Limiting alcohol use. Practicing safe sex. Taking low doses of aspirin every day. Taking vitamin and mineral supplements as recommended by your health care provider. What happens during an annual well check? The services and screenings done by your health care provider during your annual well check will depend on your age, overall health, lifestyle risk factors, and family history of disease. Counseling  Your health care provider may ask you questions about your: Alcohol use. Tobacco use. Drug use. Emotional well-being. Home and relationship well-being. Sexual activity. Eating habits. History of falls. Memory and ability to understand (cognition). Work and work  Statistician. Screening  You may have the following tests or measurements: Height, weight, and BMI. Blood pressure. Lipid and cholesterol levels. These may be checked every 5 years, or more frequently if you are over 70 years old. Skin check. Lung cancer screening. You may have this screening every year starting at age 70 if you have a 30-pack-year history of smoking and currently smoke or have quit within the past 15 years. Fecal occult blood test (FOBT) of the stool. You may have this test every year starting at age 70. Flexible sigmoidoscopy or colonoscopy. You may have a sigmoidoscopy every 5 years or a colonoscopy every 10 years starting at age 70. Prostate cancer screening. Recommendations will vary depending on your family history and other risks. Hepatitis C blood test. Hepatitis B blood test. Sexually transmitted disease (STD) testing. Diabetes screening. This is done by checking your blood sugar (glucose) after you have not eaten for a while (fasting). You may have this done every 1-3 years. Abdominal aortic aneurysm (AAA) screening. You may need this if you are a current or former smoker. Osteoporosis. You may be screened starting at age 70 if you are at high risk. Talk with your health care provider about your test results, treatment options, and if necessary, the need for more tests. Vaccines  Your health care provider may recommend certain vaccines, such as: Influenza vaccine. This is recommended every year. Tetanus, diphtheria, and acellular pertussis (Tdap, Td) vaccine. You may need a Td booster every 10 years. Zoster vaccine. You may need this after age 70. Pneumococcal 13-valent conjugate (PCV13) vaccine. One dose is recommended after age 70. Pneumococcal polysaccharide (PPSV23) vaccine. One dose is recommended after age 70. Talk to your health care provider about which screenings and vaccines you need and how  often you need them. This information is not intended to replace  advice given to you by your health care provider. Make sure you discuss any questions you have with your health care provider. Document Released: 10/14/2015 Document Revised: 06/06/2016 Document Reviewed: 07/19/2015 Elsevier Interactive Patient Education  2017 [place] Prevention in the Home Falls can cause injuries. They can happen to people of all ages. There are many things you can do to make your home safe and to help prevent falls. What can I do on the outside of my home? Regularly fix the edges of walkways and driveways and fix any cracks. Remove anything that might make you trip as you walk through a door, such as a raised step or threshold. Trim any bushes or trees on the path to your home. Use bright outdoor lighting. Clear any walking paths of anything that might make someone trip, such as rocks or tools. Regularly check to see if handrails are loose or broken. Make sure that both sides of any steps have handrails. Any raised decks and porches should have guardrails on the edges. Have any leaves, snow, or ice cleared regularly. Use sand or salt on walking paths during winter. Clean up any spills in your garage right away. This includes oil or grease spills. What can I do in the bathroom? Use night lights. Install grab bars by the toilet and in the tub and shower. Do not use towel bars as grab bars. Use non-skid mats or decals in the tub or shower. If you need to sit down in the shower, use a plastic, non-slip stool. Keep the floor dry. Clean up any water that spills on the floor as soon as it happens. Remove soap buildup in the tub or shower regularly. Attach bath mats securely with double-sided non-slip rug tape. Do not have throw rugs and other things on the floor that can make you trip. What can I do in the bedroom? Use night lights. Make sure that you have a light by your bed that is easy to reach. Do not use any sheets or blankets that are too big for your bed.  They should not hang down onto the floor. Have a firm chair that has side arms. You can use this for support while you get dressed. Do not have throw rugs and other things on the floor that can make you trip. What can I do in the kitchen? Clean up any spills right away. Avoid walking on wet floors. Keep items that you use a lot in easy-to-reach places. If you need to reach something above you, use a strong step stool that has a grab bar. Keep electrical cords out of the way. Do not use floor polish or wax that makes floors slippery. If you must use wax, use non-skid floor wax. Do not have throw rugs and other things on the floor that can make you trip. What can I do with my stairs? Do not leave any items on the stairs. Make sure that there are handrails on both sides of the stairs and use them. Fix handrails that are broken or loose. Make sure that handrails are as long as the stairways. Check any carpeting to make sure that it is firmly attached to the stairs. Fix any carpet that is loose or worn. Avoid having throw rugs at the top or bottom of the stairs. If you do have throw rugs, attach them to the floor with carpet tape. Make sure that you have a  light switch at the top of the stairs and the bottom of the stairs. If you do not have them, ask someone to add them for you. What else can I do to help prevent falls? Wear shoes that: Do not have high heels. Have rubber bottoms. Are comfortable and fit you well. Are closed at the toe. Do not wear sandals. If you use a stepladder: Make sure that it is fully opened. Do not climb a closed stepladder. Make sure that both sides of the stepladder are locked into place. Ask someone to hold it for you, if possible. Clearly mark and make sure that you can see: Any grab bars or handrails. First and last steps. Where the edge of each step is. Use tools that help you move around (mobility aids) if they are needed. These  include: Canes. Walkers. Scooters. Crutches. Turn on the lights when you go into a dark area. Replace any light bulbs as soon as they burn out. Set up your furniture so you have a clear path. Avoid moving your furniture around. If any of your floors are uneven, fix them. If there are any pets around you, be aware of where they are. Review your medicines with your doctor. Some medicines can make you feel dizzy. This can increase your chance of falling. Ask your doctor what other things that you can do to help prevent falls. This information is not intended to replace advice given to you by your health care provider. Make sure you discuss any questions you have with your health care provider. Document Released: 07/14/2009 Document Revised: 02/23/2016 Document Reviewed: 10/22/2014 Elsevier Interactive Patient Education  2017 [employer].

## 2022-04-24 DIAGNOSIS — I4892 Unspecified atrial flutter: Secondary | ICD-10-CM | POA: Diagnosis not present

## 2022-04-25 DIAGNOSIS — I4892 Unspecified atrial flutter: Secondary | ICD-10-CM | POA: Diagnosis not present

## 2022-04-25 DIAGNOSIS — I471 Supraventricular tachycardia: Secondary | ICD-10-CM | POA: Diagnosis not present

## 2022-04-25 DIAGNOSIS — I4729 Other ventricular tachycardia: Secondary | ICD-10-CM | POA: Diagnosis not present

## 2022-04-25 DIAGNOSIS — I4891 Unspecified atrial fibrillation: Secondary | ICD-10-CM | POA: Diagnosis not present

## 2022-05-22 ENCOUNTER — Other Ambulatory Visit: Payer: Self-pay | Admitting: Internal Medicine

## 2022-05-29 ENCOUNTER — Encounter: Payer: Self-pay | Admitting: Internal Medicine

## 2022-05-29 NOTE — Patient Instructions (Addendum)
Blood work was ordered.     Medications changes include :  stop finasteride, start fenofibrate for your triglycerides    Your prescription(s) have been sent to your pharmacy.    A referral was ordered for [facility].     Someone from that office will call you to schedule an appointment.    Return in about 6 months (around 11/29/2022) for follow up.   Health Maintenance, Male Adopting a healthy lifestyle and getting preventive care are important in promoting health and wellness. Ask your health care provider about: The right schedule for you to have regular tests and exams. Things you can do on your own to prevent diseases and keep yourself healthy. What should I know about diet, weight, and exercise? Eat a healthy diet  Eat a diet that includes plenty of vegetables, fruits, low-fat dairy products, and lean protein. Do not eat a lot of foods that are high in solid fats, added sugars, or sodium. Maintain a healthy weight Body mass index (BMI) is a measurement that can be used to identify possible weight problems. It estimates body fat based on height and weight. Your health care provider can help determine your BMI and help you achieve or maintain a healthy weight. Get regular exercise Get regular exercise. This is one of the most important things you can do for your health. Most adults should: Exercise for at least 150 minutes each week. The exercise should increase your heart rate and make you sweat (moderate-intensity exercise). Do strengthening exercises at least twice a week. This is in addition to the moderate-intensity exercise. Spend less time sitting. Even light physical activity can be beneficial. Watch cholesterol and blood lipids Have your blood tested for lipids and cholesterol at 70 years of age, then have this test every 5 years. You may need to have your cholesterol levels checked more often if: Your lipid or cholesterol levels are high. You are older  than 70 years of age. You are at high risk for heart disease. What should I know about cancer screening? Many types of cancers can be detected early and may often be prevented. Depending on your health history and family history, you may need to have cancer screening at various ages. This may include screening for: Colorectal cancer. Prostate cancer. Skin cancer. Lung cancer. What should I know about heart disease, diabetes, and high blood pressure? Blood pressure and heart disease High blood pressure causes heart disease and increases the risk of stroke. This is more likely to develop in people who have high blood pressure readings or are overweight. Talk with your health care provider about your target blood pressure readings. Have your blood pressure checked: Every 3-5 years if you are 60-27 years of age. Every year if you are 75 years old or older. If you are between the ages of 77 and 86 and are a current or former smoker, ask your health care provider if you should have a one-time screening for abdominal aortic aneurysm (AAA). Diabetes Have regular diabetes screenings. This checks your fasting blood sugar level. Have the screening done: Once every three years after age 83 if you are at a normal weight and have a low risk for diabetes. More often and at a younger age if you are overweight or have a high risk for diabetes. What should I know about preventing infection? Hepatitis B If you have a higher risk for hepatitis B, you should be screened for this virus. Talk with your  health care provider to find out if you are at risk for hepatitis B infection. Hepatitis C Blood testing is recommended for: Everyone born from [birth year] through 1964. Anyone with known risk factors for hepatitis C. Sexually transmitted infections (STIs) You should be screened each year for STIs, including gonorrhea and chlamydia, if: You are sexually active and are younger than 70 years of age. You are older than  70 years of age and your health care provider tells you that you are at risk for this type of infection. Your sexual activity has changed since you were last screened, and you are at increased risk for chlamydia or gonorrhea. Ask your health care provider if you are at risk. Ask your health care provider about whether you are at high risk for HIV. Your health care provider may recommend a prescription medicine to help prevent HIV infection. If you choose to take medicine to prevent HIV, you should first get tested for HIV. You should then be tested every 3 months for as long as you are taking the medicine. Follow these instructions at home: Alcohol use Do not drink alcohol if your health care provider tells you not to drink. If you drink alcohol: Limit how much you have to 0-2 drinks a day. Know how much alcohol is in your drink. In the [place] one drink equals one 12 oz bottle of beer (355 mL), one 5 oz glass of wine (148 mL), or one 1 oz glass of hard liquor (44 mL). Lifestyle Do not use any products that contain nicotine or tobacco. These products include cigarettes, chewing tobacco, and vaping devices, such as e-cigarettes. If you need help quitting, ask your health care provider. Do not use street drugs. Do not share needles. Ask your health care provider for help if you need support or information about quitting drugs. General instructions Schedule regular health, dental, and eye exams. Stay current with your vaccines. Tell your health care provider if: You often feel depressed. You have ever been abused or do not feel safe at home. Summary Adopting a healthy lifestyle and getting preventive care are important in promoting health and wellness. Follow your health care provider's instructions about healthy diet, exercising, and getting tested or screened for diseases. Follow your health care provider's instructions on monitoring your cholesterol and blood pressure. This information is not  intended to replace advice given to you by your health care provider. Make sure you discuss any questions you have with your health care provider. Document Revised: 02/06/2021 Document Reviewed: 02/06/2021 Elsevier Patient Education  [place].

## 2022-05-29 NOTE — Progress Notes (Unsigned)
Subjective:    Patient ID: Richard Trevino, male    DOB: 07-17-1951, 71 y.o.   MRN: [id number]     HPI Richard Trevino is here for a physical exam.  July was doing yardwork - side started hurting, then had gross hematuria.  Has not had hematuria since then.  It was a while ago now so he does not remember exactly where he felt the pain at that time.  He is not exercising regularly.   He is not compliant with a diabetic diet.    He does feel like the finasteride is causing some erectile dysfunction.  He noticed it when he started the medication.   Medications and allergies reviewed with patient and updated if appropriate.  Current Outpatient Medications on File Prior to Visit  Medication Sig Dispense Refill   acetaminophen (TYLENOL) 650 MG CR tablet Take 650 mg by mouth every 8 (eight) hours as needed for pain.     aspirin 325 MG EC tablet Take 325 mg by mouth daily.     Biotin 10000 MCG TABS Take by mouth.     Calcium Carb-Magnesium Carb (MAGNEBIND 400) 80-115 MG TABS Take 1 tablet by mouth daily.     Cholecalciferol (VITAMIN D3) 1000 UNITS CAPS Take by mouth daily.     Cholecalciferol 25 MCG (1000 UT) tablet Take by mouth.     Coenzyme Q10 (COQ10) 50 MG CAPS Take by mouth daily.     Cyanocobalamin (VITAMIN B 12 PO) Take by mouth.     diltiazem (TIAZAC) 180 MG 24 hr capsule Take 1 capsule by mouth daily.     ELIQUIS 5 MG TABS tablet Take 5 mg by mouth 2 (two) times daily.     Flaxseed, Linseed, (FLAX SEED OIL) 1000 MG CAPS Take by mouth.     FLUoxetine (PROZAC) 40 MG capsule TAKE ONE CAPSULE BY MOUTH DAILY 90 capsule 3   Glucosamine Sulfate 1000 MG TABS Take by mouth. 2 by mouth daily     hydrochlorothiazide (HYDRODIURIL) 25 MG tablet TAKE 1/2 TABLET BY MOUTH DAILY 45 tablet 1   losartan (COZAAR) 25 MG tablet Take 25 mg by mouth daily.      Magnesium 400 MG TABS Take by mouth.     Melatonin 10 MG TABS Take by mouth.     metoprolol tartrate (LOPRESSOR) 25 MG tablet Take 12.5 mg by  mouth 2 (two) times daily.      Multiple Vitamin (MULTIVITAMIN) tablet Take 1 tablet by mouth daily.     pantoprazole (PROTONIX) 40 MG tablet TAKE 1 TABLET BY MOUTH DAILY 30 MINUTES BEFORE BREAKFAST 90 tablet 1   pravastatin (PRAVACHOL) 20 MG tablet TAKE 1/2 TABLET BY MOUTH NIGHTLY AT BEDTIME 45 tablet 1   ranolazine (RANEXA) 500 MG 12 hr tablet Take 500 mg by mouth 2 (two) times daily.      tamsulosin (FLOMAX) 0.4 MG CAPS capsule TAKE ONE CAPSULE BY MOUTH DAILY 90 capsule 1   VITAMIN E PO Take by mouth.     Zinc 25 MG TABS Take by mouth.     No current facility-administered medications on file prior to visit.    Review of Systems  Constitutional:  Negative for chills and fever.  Eyes:  Negative for visual disturbance.  Respiratory:  Negative for cough, shortness of breath and wheezing.   Cardiovascular:  Negative for chest pain, palpitations and leg swelling.  Gastrointestinal:  Negative for abdominal pain, blood in stool, constipation, diarrhea and nausea.  No gerd  Genitourinary:  Positive for hematuria (one episode). Negative for difficulty urinating.  Musculoskeletal:  Positive for arthralgias (only occasional). Negative for back pain.  Skin:  Negative for rash.  Neurological:  Negative for light-headedness and headaches.  Psychiatric/Behavioral:  Negative for dysphoric mood. The patient is not nervous/anxious.        Objective:   Vitals:   05/30/22 1118  BP: 136/80  Pulse: 62  Temp: 98 F (36.7 C)  SpO2: 98%   Filed Weights   05/30/22 1118  Weight: 268 lb (121.6 kg)   Body mass index is 37.38 kg/m.  BP Readings from Last 3 Encounters:  05/30/22 136/80  11/27/21 130/80  06/15/21 (!) 160/80    Wt Readings from Last 3 Encounters:  05/30/22 268 lb (121.6 kg)  11/27/21 260 lb (117.9 kg)  06/15/21 259 lb (117.5 kg)      Physical Exam Constitutional: He appears well-developed and well-nourished. No distress.  HENT:  Head: Normocephalic and atraumatic.   Right Ear: External ear normal.  Left Ear: External ear normal.  Mouth/Throat: Oropharynx is clear and moist.  Normal ear canals and TM b/l  Eyes: Conjunctivae and EOM are normal.  Neck: Neck supple. No tracheal deviation present. No thyromegaly present.  No carotid bruit  Cardiovascular: Normal rate, regular rhythm, normal heart sounds and intact distal pulses.   No murmur heard. Pulmonary/Chest: Effort normal and breath sounds normal. No respiratory distress. He has no wheezes. He has no rales.  Abdominal: Soft. He exhibits no distension. There is no tenderness.  Genitourinary: deferred  Musculoskeletal: He exhibits no edema.  Lymphadenopathy:   He has no cervical adenopathy.  Skin: Skin is warm and dry. He is not diaphoretic.  Psychiatric: He has a normal mood and affect. His behavior is normal.         Assessment & Plan:   Physical exam: Screening blood work  ordered Exercise   none - encouraged regular exercise Weight  obese- encouraged weight loss Substance abuse   none   Reviewed recommended immunizations.   Health Maintenance  Topic Date Due   COVID-19 Vaccine (2 - Janssen risk series) 06/15/2022 (Originally 02/10/2020)   Pneumonia Vaccine 30+ Years old (1 - PCV) 11/27/2022 (Originally 06/10/2017)   INFLUENZA VACCINE  12/30/2022 (Originally 05/01/2022)   Zoster Vaccines- Shingrix (1 of 2) 08/26/2054 (Originally 06/11/1971)   COLONOSCOPY (Pts 45-56yr Insurance coverage will need to be confirmed)  09/02/2022   TETANUS/TDAP  06/19/2025   Hepatitis C Screening  Completed   HPV VACCINES  Aged Out     See Problem List for Assessment and Plan of chronic medical problems.

## 2022-05-30 ENCOUNTER — Ambulatory Visit (INDEPENDENT_AMBULATORY_CARE_PROVIDER_SITE_OTHER): Payer: Medicare Other | Admitting: Internal Medicine

## 2022-05-30 VITALS — BP 136/80 | HR 62 | Temp 98.0°F | Ht 71.0 in | Wt 268.0 lb

## 2022-05-30 DIAGNOSIS — Z125 Encounter for screening for malignant neoplasm of prostate: Secondary | ICD-10-CM

## 2022-05-30 DIAGNOSIS — R7303 Prediabetes: Secondary | ICD-10-CM

## 2022-05-30 DIAGNOSIS — R310 Gross hematuria: Secondary | ICD-10-CM | POA: Insufficient documentation

## 2022-05-30 DIAGNOSIS — K219 Gastro-esophageal reflux disease without esophagitis: Secondary | ICD-10-CM

## 2022-05-30 DIAGNOSIS — N401 Enlarged prostate with lower urinary tract symptoms: Secondary | ICD-10-CM | POA: Diagnosis not present

## 2022-05-30 DIAGNOSIS — E785 Hyperlipidemia, unspecified: Secondary | ICD-10-CM

## 2022-05-30 DIAGNOSIS — Z0000 Encounter for general adult medical examination without abnormal findings: Secondary | ICD-10-CM | POA: Diagnosis not present

## 2022-05-30 DIAGNOSIS — I4892 Unspecified atrial flutter: Secondary | ICD-10-CM

## 2022-05-30 DIAGNOSIS — I10 Essential (primary) hypertension: Secondary | ICD-10-CM

## 2022-05-30 DIAGNOSIS — N1831 Chronic kidney disease, stage 3a: Secondary | ICD-10-CM | POA: Diagnosis not present

## 2022-05-30 DIAGNOSIS — F3289 Other specified depressive episodes: Secondary | ICD-10-CM

## 2022-05-30 LAB — CBC WITH DIFFERENTIAL/PLATELET
Basophils Absolute: 0 10*3/uL (ref 0.0–0.1)
Basophils Relative: 0.6 % (ref 0.0–3.0)
Eosinophils Absolute: 0.2 10*3/uL (ref 0.0–0.7)
Eosinophils Relative: 2.6 % (ref 0.0–5.0)
HCT: 44.6 % (ref 39.0–52.0)
Hemoglobin: 15.5 g/dL (ref 13.0–17.0)
Lymphocytes Relative: 25 % (ref 12.0–46.0)
Lymphs Abs: 1.6 10*3/uL (ref 0.7–4.0)
MCHC: 34.7 g/dL (ref 30.0–36.0)
MCV: 92.2 fl (ref 78.0–100.0)
Monocytes Absolute: 0.5 10*3/uL (ref 0.1–1.0)
Monocytes Relative: 7.4 % (ref 3.0–12.0)
Neutro Abs: 4.2 10*3/uL (ref 1.4–7.7)
Neutrophils Relative %: 64.4 % (ref 43.0–77.0)
Platelets: 168 10*3/uL (ref 150.0–400.0)
RBC: 4.83 Mil/uL (ref 4.22–5.81)
RDW: 12.6 % (ref 11.5–15.5)
WBC: 6.5 10*3/uL (ref 4.0–10.5)

## 2022-05-30 LAB — COMPREHENSIVE METABOLIC PANEL
ALT: 29 U/L (ref 0–53)
AST: 26 U/L (ref 0–37)
Albumin: 4.7 g/dL (ref 3.5–5.2)
Alkaline Phosphatase: 77 U/L (ref 39–117)
BUN: 19 mg/dL (ref 6–23)
CO2: 26 mEq/L (ref 19–32)
Calcium: 9.6 mg/dL (ref 8.4–10.5)
Chloride: 103 mEq/L (ref 96–112)
Creatinine, Ser: 1.17 mg/dL (ref 0.40–1.50)
GFR: 63.4 mL/min (ref >60.00)
Glucose, Bld: 109 mg/dL — ABNORMAL HIGH (ref 70–99)
Potassium: 4.2 mEq/L (ref 3.5–5.1)
Sodium: 140 mEq/L (ref 135–145)
Total Bilirubin: 1.9 mg/dL — ABNORMAL HIGH (ref 0.2–1.2)
Total Protein: 7.1 g/dL (ref 6.0–8.3)

## 2022-05-30 LAB — LIPID PANEL
Cholesterol: 191 mg/dL (ref 0–200)
HDL: 37.8 mg/dL — ABNORMAL LOW (ref >39.00)
Total CHOL/HDL Ratio: 5
Triglycerides: 552 mg/dL — ABNORMAL HIGH (ref 0.0–149.0)

## 2022-05-30 LAB — HEMOGLOBIN A1C: Hgb A1c MFr Bld: 5.9 % (ref 4.6–6.5)

## 2022-05-30 LAB — PSA, MEDICARE: PSA: 0.25 ng/ml (ref 0.10–4.00)

## 2022-05-30 LAB — LDL CHOLESTEROL, DIRECT: Direct LDL: 79 mg/dL

## 2022-05-30 MED ORDER — FENOFIBRATE 145 MG PO TABS: 145.0000 mg | ORAL_TABLET | Freq: Every day | ORAL | 3 refills | Status: DC

## 2022-05-30 NOTE — Assessment & Plan Note (Signed)
Chronic Encouraged weight loss Encouraged walking 30 minutes a day Discussed decreased portions

## 2022-05-30 NOTE — Assessment & Plan Note (Signed)
Chronic CBC, CMP 

## 2022-05-30 NOTE — Assessment & Plan Note (Addendum)
Chronic Regular exercise and healthy diet encouraged Check lipid panel  Continue pravastatin 10 mg daily 

## 2022-05-30 NOTE — Assessment & Plan Note (Addendum)
Chronic Following with cardiology Had radiofrequency ablation 06/2020 Recent Holter monitor showed an episode of atrial fibrillation Continue Eliquis 5 mg twice daily, diltiazem 180 mg daily, metoprolol 12.5 mg twice daily

## 2022-05-30 NOTE — Assessment & Plan Note (Signed)
Chronic GERD controlled Continue pantoprazole 40 mg daily 

## 2022-05-30 NOTE — Assessment & Plan Note (Signed)
Chronic Blood pressure well controlled CMP Continue diltiazem 180 mg daily, HCTZ 25 mg daily, losartan 25 mg daily, metoprolol 12.5 mg twice daily

## 2022-05-30 NOTE — Assessment & Plan Note (Addendum)
Chronic Denies difficulty with urination/emptying bladder Continue tamsulosin 0.4 mg daily Will discontinue finasteride because it is causing erectile dysfunction He will be seeing urology regarding the gross hematuria taking evaluating his BPH and need for an additional medication

## 2022-05-30 NOTE — Assessment & Plan Note (Signed)
Chronic Check a1c Low sugar / carb diet Stressed regular exercise  

## 2022-05-30 NOTE — Assessment & Plan Note (Addendum)
New 1 episode last month of gross hematuria associated with some side pain History of kidney stones so he may have passed a stone Denies any current urinary symptoms and has not seen blood since then Have referred him back to urology for evaluation Urinalysis today

## 2022-05-30 NOTE — Assessment & Plan Note (Signed)
Chronic Controlled, Stable Continue fluoxetine 40 mg daily

## 2022-05-31 LAB — URINALYSIS, ROUTINE W REFLEX MICROSCOPIC
Bilirubin Urine: NEGATIVE
Hgb urine dipstick: NEGATIVE
Ketones, ur: NEGATIVE
Leukocytes,Ua: NEGATIVE
Nitrite: NEGATIVE
RBC / HPF: NONE SEEN (ref 0–?)
Specific Gravity, Urine: >=1.03 — AB (ref 1.000–1.030)
Total Protein, Urine: NEGATIVE
Urine Glucose: NEGATIVE
Urobilinogen, UA: 0.2 (ref 0.0–1.0)
WBC, UA: NONE SEEN (ref 0–?)
pH: 5.5 (ref 5.0–8.0)

## 2022-06-11 DIAGNOSIS — R310 Gross hematuria: Secondary | ICD-10-CM | POA: Diagnosis not present

## 2022-06-11 DIAGNOSIS — R319 Hematuria, unspecified: Secondary | ICD-10-CM | POA: Diagnosis not present

## 2022-06-21 DIAGNOSIS — I700 Atherosclerosis of aorta: Secondary | ICD-10-CM | POA: Diagnosis not present

## 2022-06-21 DIAGNOSIS — N200 Calculus of kidney: Secondary | ICD-10-CM | POA: Diagnosis not present

## 2022-06-21 DIAGNOSIS — R319 Hematuria, unspecified: Secondary | ICD-10-CM | POA: Diagnosis not present

## 2022-06-21 DIAGNOSIS — R310 Gross hematuria: Secondary | ICD-10-CM | POA: Diagnosis not present

## 2022-06-29 ENCOUNTER — Other Ambulatory Visit: Payer: Self-pay | Admitting: Internal Medicine

## 2022-07-04 ENCOUNTER — Other Ambulatory Visit: Payer: Self-pay | Admitting: Internal Medicine

## 2022-07-04 DIAGNOSIS — I4892 Unspecified atrial flutter: Secondary | ICD-10-CM | POA: Diagnosis not present

## 2022-07-04 DIAGNOSIS — I10 Essential (primary) hypertension: Secondary | ICD-10-CM | POA: Diagnosis not present

## 2022-07-04 DIAGNOSIS — I480 Paroxysmal atrial fibrillation: Secondary | ICD-10-CM | POA: Diagnosis not present

## 2022-07-04 DIAGNOSIS — Z7901 Long term (current) use of anticoagulants: Secondary | ICD-10-CM | POA: Diagnosis not present

## 2022-07-19 DIAGNOSIS — Z01810 Encounter for preprocedural cardiovascular examination: Secondary | ICD-10-CM | POA: Diagnosis not present

## 2022-07-19 DIAGNOSIS — I340 Nonrheumatic mitral (valve) insufficiency: Secondary | ICD-10-CM | POA: Diagnosis not present

## 2022-07-19 DIAGNOSIS — I480 Paroxysmal atrial fibrillation: Secondary | ICD-10-CM | POA: Diagnosis not present

## 2022-07-19 DIAGNOSIS — I4892 Unspecified atrial flutter: Secondary | ICD-10-CM | POA: Diagnosis not present

## 2022-07-19 DIAGNOSIS — I422 Other hypertrophic cardiomyopathy: Secondary | ICD-10-CM | POA: Diagnosis not present

## 2022-07-19 DIAGNOSIS — I2510 Atherosclerotic heart disease of native coronary artery without angina pectoris: Secondary | ICD-10-CM | POA: Diagnosis not present

## 2022-07-20 DIAGNOSIS — N189 Chronic kidney disease, unspecified: Secondary | ICD-10-CM | POA: Diagnosis not present

## 2022-07-20 DIAGNOSIS — Z7901 Long term (current) use of anticoagulants: Secondary | ICD-10-CM | POA: Diagnosis not present

## 2022-07-20 DIAGNOSIS — Z79899 Other long term (current) drug therapy: Secondary | ICD-10-CM | POA: Diagnosis not present

## 2022-07-20 DIAGNOSIS — Z87891 Personal history of nicotine dependence: Secondary | ICD-10-CM | POA: Diagnosis not present

## 2022-07-20 DIAGNOSIS — I4719 Other supraventricular tachycardia: Secondary | ICD-10-CM | POA: Diagnosis not present

## 2022-07-20 DIAGNOSIS — I4892 Unspecified atrial flutter: Secondary | ICD-10-CM | POA: Diagnosis not present

## 2022-07-20 DIAGNOSIS — I129 Hypertensive chronic kidney disease with stage 1 through stage 4 chronic kidney disease, or unspecified chronic kidney disease: Secondary | ICD-10-CM | POA: Diagnosis not present

## 2022-07-20 DIAGNOSIS — I480 Paroxysmal atrial fibrillation: Secondary | ICD-10-CM | POA: Diagnosis not present

## 2022-07-21 DIAGNOSIS — Z87891 Personal history of nicotine dependence: Secondary | ICD-10-CM | POA: Diagnosis not present

## 2022-07-21 DIAGNOSIS — I4719 Other supraventricular tachycardia: Secondary | ICD-10-CM | POA: Diagnosis not present

## 2022-07-21 DIAGNOSIS — Z9889 Other specified postprocedural states: Secondary | ICD-10-CM | POA: Diagnosis not present

## 2022-07-21 DIAGNOSIS — I10 Essential (primary) hypertension: Secondary | ICD-10-CM | POA: Diagnosis not present

## 2022-07-21 DIAGNOSIS — Z79899 Other long term (current) drug therapy: Secondary | ICD-10-CM | POA: Diagnosis not present

## 2022-07-21 DIAGNOSIS — I4892 Unspecified atrial flutter: Secondary | ICD-10-CM | POA: Diagnosis not present

## 2022-07-21 DIAGNOSIS — I480 Paroxysmal atrial fibrillation: Secondary | ICD-10-CM | POA: Diagnosis not present

## 2022-07-21 DIAGNOSIS — N189 Chronic kidney disease, unspecified: Secondary | ICD-10-CM | POA: Diagnosis not present

## 2022-07-21 DIAGNOSIS — Z7901 Long term (current) use of anticoagulants: Secondary | ICD-10-CM | POA: Diagnosis not present

## 2022-07-21 DIAGNOSIS — I129 Hypertensive chronic kidney disease with stage 1 through stage 4 chronic kidney disease, or unspecified chronic kidney disease: Secondary | ICD-10-CM | POA: Diagnosis not present

## 2022-07-22 DIAGNOSIS — I4891 Unspecified atrial fibrillation: Secondary | ICD-10-CM | POA: Diagnosis not present

## 2022-08-01 DIAGNOSIS — X32XXXS Exposure to sunlight, sequela: Secondary | ICD-10-CM | POA: Diagnosis not present

## 2022-08-01 DIAGNOSIS — L578 Other skin changes due to chronic exposure to nonionizing radiation: Secondary | ICD-10-CM | POA: Diagnosis not present

## 2022-08-01 DIAGNOSIS — Z85828 Personal history of other malignant neoplasm of skin: Secondary | ICD-10-CM | POA: Diagnosis not present

## 2022-08-01 DIAGNOSIS — L814 Other melanin hyperpigmentation: Secondary | ICD-10-CM | POA: Diagnosis not present

## 2022-08-28 ENCOUNTER — Encounter: Payer: Self-pay | Admitting: Gastroenterology

## 2022-09-12 DIAGNOSIS — I10 Essential (primary) hypertension: Secondary | ICD-10-CM | POA: Diagnosis not present

## 2022-09-12 DIAGNOSIS — I483 Typical atrial flutter: Secondary | ICD-10-CM | POA: Diagnosis not present

## 2022-09-12 DIAGNOSIS — K2100 Gastro-esophageal reflux disease with esophagitis, without bleeding: Secondary | ICD-10-CM | POA: Diagnosis not present

## 2022-09-12 DIAGNOSIS — I480 Paroxysmal atrial fibrillation: Secondary | ICD-10-CM | POA: Diagnosis not present

## 2022-09-14 DIAGNOSIS — I498 Other specified cardiac arrhythmias: Secondary | ICD-10-CM | POA: Diagnosis not present

## 2022-09-14 DIAGNOSIS — I480 Paroxysmal atrial fibrillation: Secondary | ICD-10-CM | POA: Diagnosis not present

## 2022-09-28 ENCOUNTER — Other Ambulatory Visit: Payer: Self-pay | Admitting: Internal Medicine

## 2022-10-30 DIAGNOSIS — I480 Paroxysmal atrial fibrillation: Secondary | ICD-10-CM | POA: Diagnosis not present

## 2022-10-30 DIAGNOSIS — I4719 Other supraventricular tachycardia: Secondary | ICD-10-CM | POA: Diagnosis not present

## 2022-11-22 DIAGNOSIS — H25043 Posterior subcapsular polar age-related cataract, bilateral: Secondary | ICD-10-CM | POA: Diagnosis not present

## 2022-11-25 ENCOUNTER — Other Ambulatory Visit: Payer: Self-pay | Admitting: Internal Medicine

## 2022-11-26 ENCOUNTER — Other Ambulatory Visit: Payer: Self-pay

## 2022-12-31 DIAGNOSIS — K080 Exfoliation of teeth due to systemic causes: Secondary | ICD-10-CM | POA: Diagnosis not present

## 2023-01-12 IMAGING — DX DG RIBS W/ CHEST 3+V*L*
5 series · 5 of 5 positions shown · non-contrast
Comparison: 05/26/2020

CLINICAL DATA: Fall 3 days ago with left rib pain.

EXAM:
LEFT RIBS AND CHEST - 3+ VIEW

[chest pa]
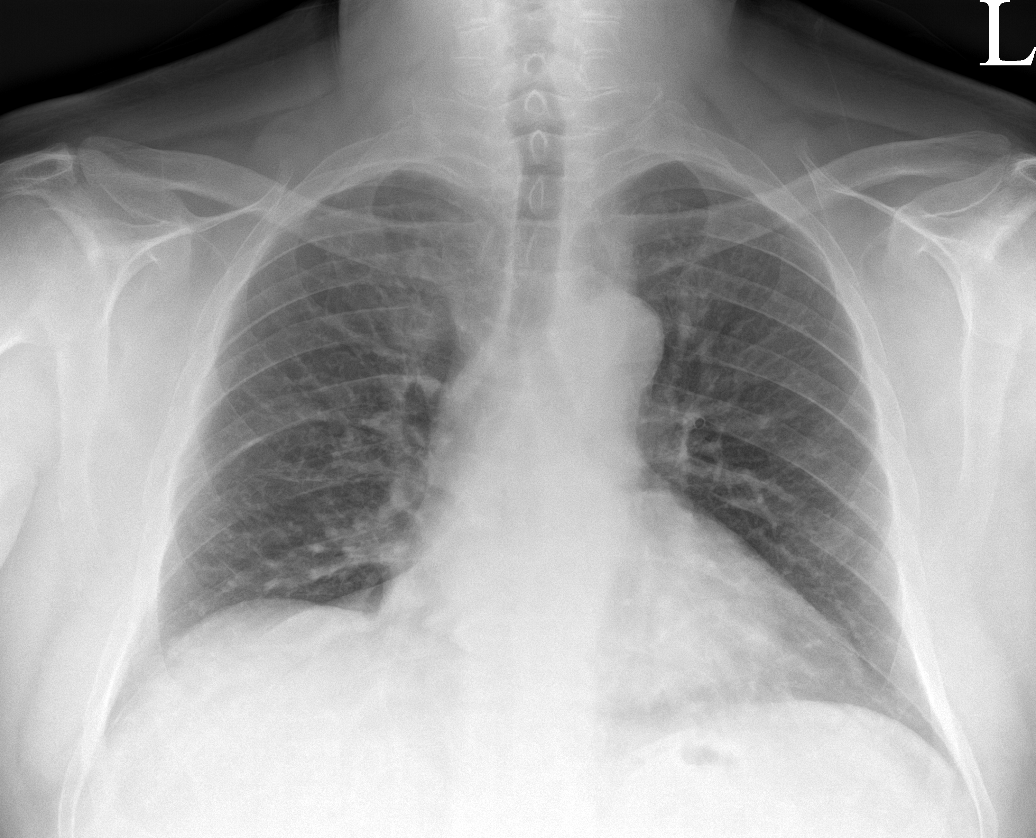

[rib ap (1 of 2)]
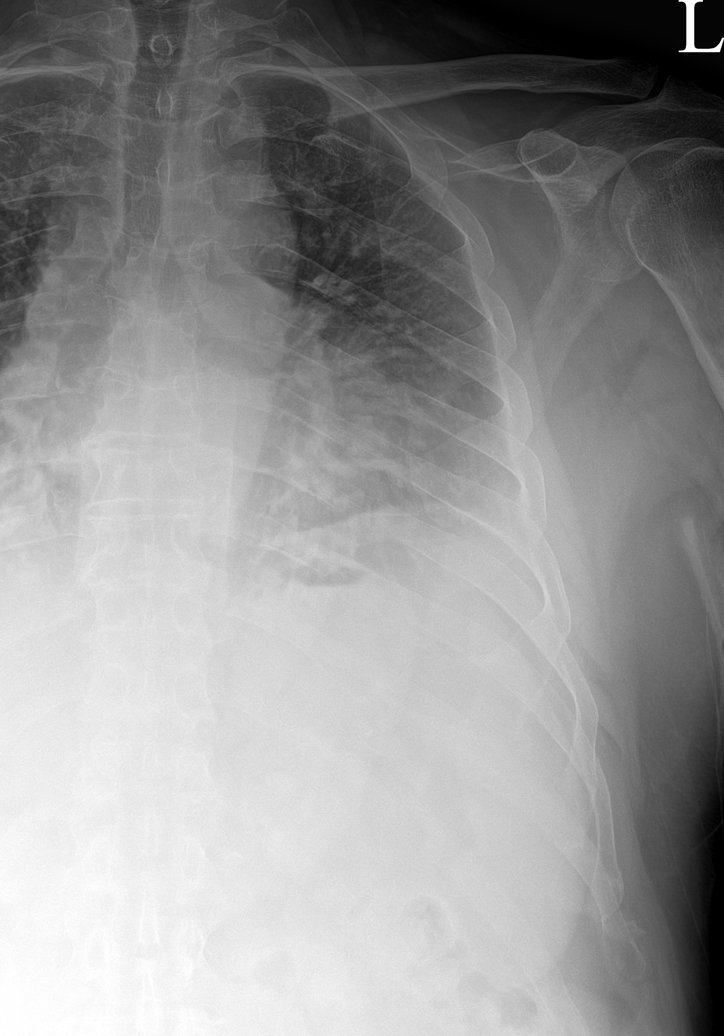

[rib ap (2 of 2)]
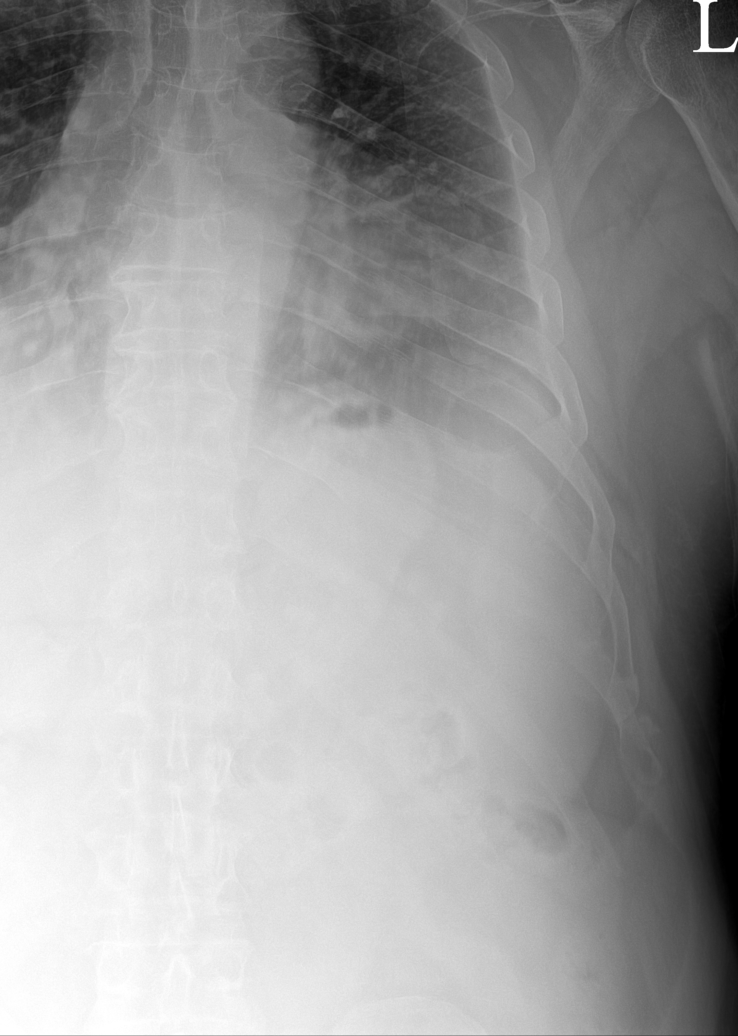

[rib obl (1 of 2)]
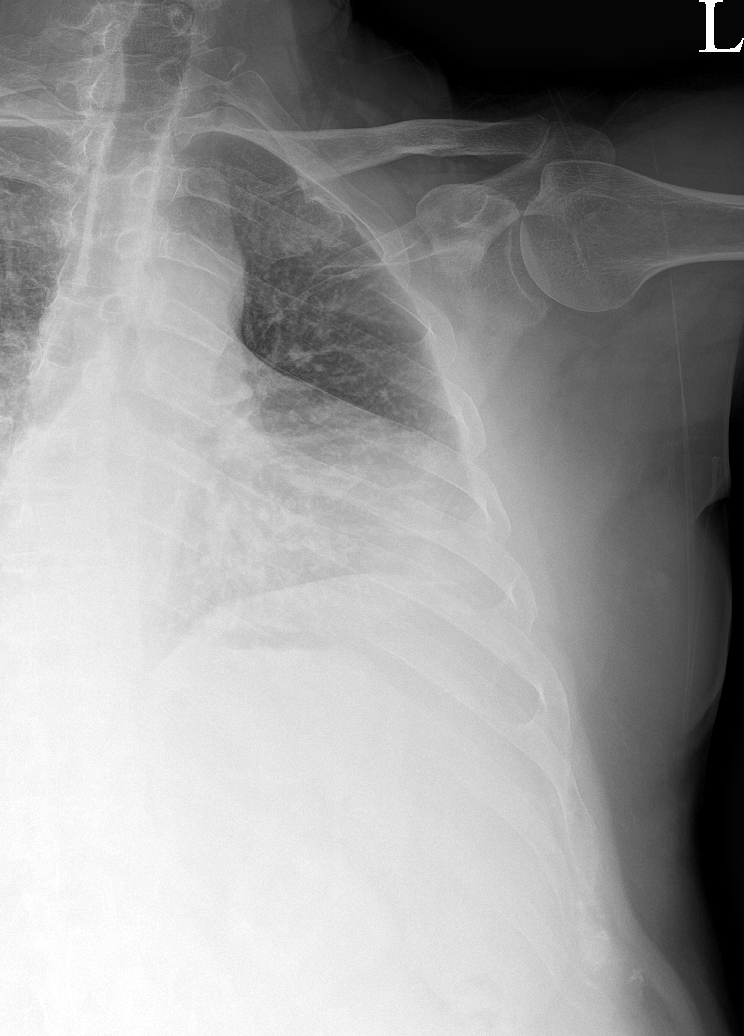

[rib obl (2 of 2)]
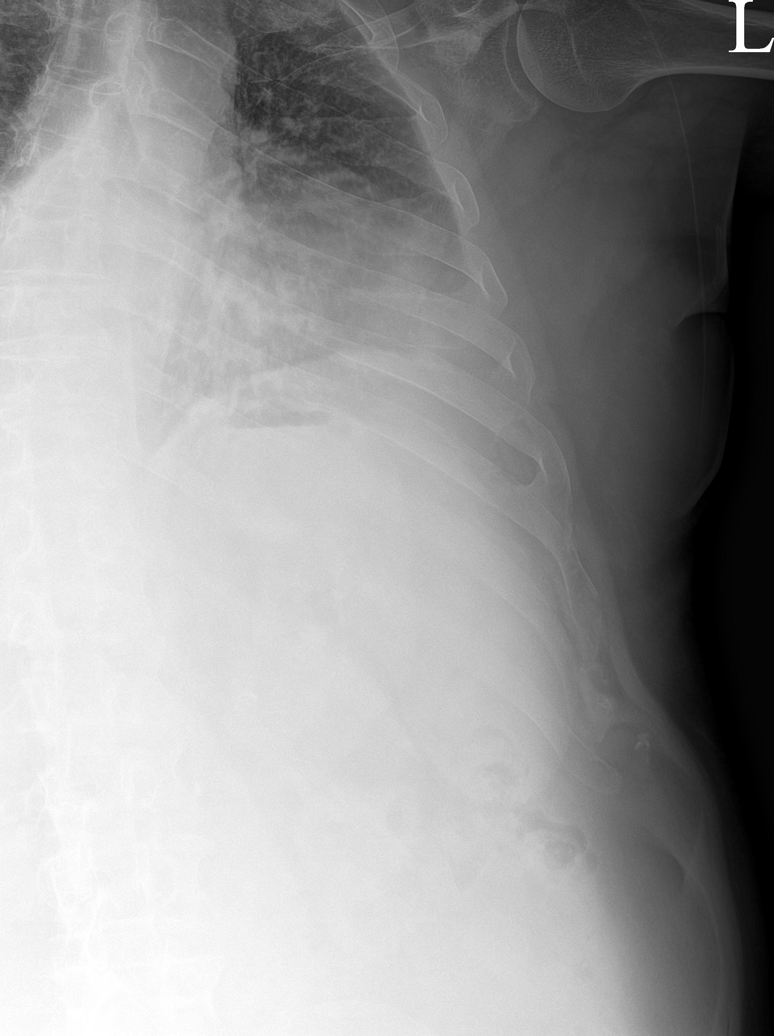

[5 of 5 positions shown; findings below may reference images not displayed]

FINDINGS: No fracture or other bone lesions are seen involving the ribs. There
is no evidence of pneumothorax or pleural effusion. Both lungs are
clear. Heart size and mediastinal contours are within normal limits.
IMPRESSION: Negative.

## 2023-03-23 ENCOUNTER — Other Ambulatory Visit: Payer: Self-pay | Admitting: Internal Medicine

## 2023-04-03 DIAGNOSIS — L570 Actinic keratosis: Secondary | ICD-10-CM | POA: Diagnosis not present

## 2023-04-24 ENCOUNTER — Ambulatory Visit (INDEPENDENT_AMBULATORY_CARE_PROVIDER_SITE_OTHER): Payer: Medicare Other

## 2023-04-24 VITALS — BP 130/80 | HR 71 | Temp 97.4°F | Ht 71.0 in | Wt 253.6 lb

## 2023-04-24 DIAGNOSIS — Z0000 Encounter for general adult medical examination without abnormal findings: Secondary | ICD-10-CM

## 2023-04-24 NOTE — Patient Instructions (Addendum)
Richard Trevino , Thank you for taking time to come for your Medicare Wellness Visit. I appreciate your ongoing commitment to your health goals. Please review the following plan we discussed and let me know if I can assist you in the future.   These are the goals we discussed:  Goals      Client understands the importance of follow-up with providers by attending scheduled visits.     I would love to lose some weight.  My weight goal is to be 200 pounds.        This is a list of the screening recommended for you and due dates:  Health Maintenance  Topic Date Due   Pneumonia Vaccine (1 of 1 - PCV) Never done   COVID-19 Vaccine (2 - Janssen risk series) 02/10/2020   Colon Cancer Screening  09/02/2022   Zoster (Shingles) Vaccine (1 of 2) 08/26/2054*   Flu Shot  05/02/2023   Medicare Annual Wellness Visit  04/23/2024   DTaP/Tdap/Td vaccine (3 - Td or Tdap) 06/19/2025   Hepatitis C Screening  Completed   HPV Vaccine  Aged Out  *Topic was postponed. The date shown is not the original due date.    Advanced directives: No  Conditions/risks identified: Yes  Next appointment: Follow up in one year for your annual wellness visit by calling [phone] to schedule after 04/23/2024.  Preventive Care 70 Years and Older, Male  Preventive care refers to lifestyle choices and visits with your health care provider that can promote health and wellness. What does preventive care include? A yearly physical exam. This is also called an annual well check. Dental exams once or twice a year. Routine eye exams. Ask your health care provider how often you should have your eyes checked. Personal lifestyle choices, including: Daily care of your teeth and gums. Regular physical activity. Eating a healthy diet. Avoiding tobacco and drug use. Limiting alcohol use. Practicing safe sex. Taking low doses of aspirin every day. Taking vitamin and mineral supplements as recommended by your health care  provider. What happens during an annual well check? The services and screenings done by your health care provider during your annual well check will depend on your age, overall health, lifestyle risk factors, and family history of disease. Counseling  Your health care provider may ask you questions about your: Alcohol use. Tobacco use. Drug use. Emotional well-being. Home and relationship well-being. Sexual activity. Eating habits. History of falls. Memory and ability to understand (cognition). Work and work Astronomer. Screening  You may have the following tests or measurements: Height, weight, and BMI. Blood pressure. Lipid and cholesterol levels. These may be checked every 5 years, or more frequently if you are over 11 years old. Skin check. Lung cancer screening. You may have this screening every year starting at age 41 if you have a 30-pack-year history of smoking and currently smoke or have quit within the past 15 years. Fecal occult blood test (FOBT) of the stool. You may have this test every year starting at age 28. Flexible sigmoidoscopy or colonoscopy. You may have a sigmoidoscopy every 5 years or a colonoscopy every 10 years starting at age 46. Prostate cancer screening. Recommendations will vary depending on your family history and other risks. Hepatitis C blood test. Hepatitis B blood test. Sexually transmitted disease (STD) testing. Diabetes screening. This is done by checking your blood sugar (glucose) after you have not eaten for a while (fasting). You may have this done every 1-3 years. Abdominal aortic  aneurysm (AAA) screening. You may need this if you are a current or former smoker. Osteoporosis. You may be screened starting at age 39 if you are at high risk. Talk with your health care provider about your test results, treatment options, and if necessary, the need for more tests. Vaccines  Your health care provider may recommend certain vaccines, such  as: Influenza vaccine. This is recommended every year. Tetanus, diphtheria, and acellular pertussis (Tdap, Td) vaccine. You may need a Td booster every 10 years. Zoster vaccine. You may need this after age 53. Pneumococcal 13-valent conjugate (PCV13) vaccine. One dose is recommended after age 28. Pneumococcal polysaccharide (PPSV23) vaccine. One dose is recommended after age 70. Talk to your health care provider about which screenings and vaccines you need and how often you need them. This information is not intended to replace advice given to you by your health care provider. Make sure you discuss any questions you have with your health care provider. Document Released: 10/14/2015 Document Revised: 06/06/2016 Document Reviewed: 07/19/2015 Elsevier Interactive Patient Education  2017 [employer].  Fall Prevention in the Home Falls can cause injuries. They can happen to people of all ages. There are many things you can do to make your home safe and to help prevent falls. What can I do on the outside of my home? Regularly fix the edges of walkways and driveways and fix any cracks. Remove anything that might make you trip as you walk through a door, such as a raised step or threshold. Trim any bushes or trees on the path to your home. Use bright outdoor lighting. Clear any walking paths of anything that might make someone trip, such as rocks or tools. Regularly check to see if handrails are loose or broken. Make sure that both sides of any steps have handrails. Any raised decks and porches should have guardrails on the edges. Have any leaves, snow, or ice cleared regularly. Use sand or salt on walking paths during winter. Clean up any spills in your garage right away. This includes oil or grease spills. What can I do in the bathroom? Use night lights. Install grab bars by the toilet and in the tub and shower. Do not use towel bars as grab bars. Use non-skid mats or decals in the tub or  shower. If you need to sit down in the shower, use a plastic, non-slip stool. Keep the floor dry. Clean up any water that spills on the floor as soon as it happens. Remove soap buildup in the tub or shower regularly. Attach bath mats securely with double-sided non-slip rug tape. Do not have throw rugs and other things on the floor that can make you trip. What can I do in the bedroom? Use night lights. Make sure that you have a light by your bed that is easy to reach. Do not use any sheets or blankets that are too big for your bed. They should not hang down onto the floor. Have a firm chair that has side arms. You can use this for support while you get dressed. Do not have throw rugs and other things on the floor that can make you trip. What can I do in the kitchen? Clean up any spills right away. Avoid walking on wet floors. Keep items that you use a lot in easy-to-reach places. If you need to reach something above you, use a strong step stool that has a grab bar. Keep electrical cords out of the way. Do not use floor  polish or wax that makes floors slippery. If you must use wax, use non-skid floor wax. Do not have throw rugs and other things on the floor that can make you trip. What can I do with my stairs? Do not leave any items on the stairs. Make sure that there are handrails on both sides of the stairs and use them. Fix handrails that are broken or loose. Make sure that handrails are as long as the stairways. Check any carpeting to make sure that it is firmly attached to the stairs. Fix any carpet that is loose or worn. Avoid having throw rugs at the top or bottom of the stairs. If you do have throw rugs, attach them to the floor with carpet tape. Make sure that you have a light switch at the top of the stairs and the bottom of the stairs. If you do not have them, ask someone to add them for you. What else can I do to help prevent falls? Wear shoes that: Do not have high heels. Have  rubber bottoms. Are comfortable and fit you well. Are closed at the toe. Do not wear sandals. If you use a stepladder: Make sure that it is fully opened. Do not climb a closed stepladder. Make sure that both sides of the stepladder are locked into place. Ask someone to hold it for you, if possible. Clearly mark and make sure that you can see: Any grab bars or handrails. First and last steps. Where the edge of each step is. Use tools that help you move around (mobility aids) if they are needed. These include: Canes. Walkers. Scooters. Crutches. Turn on the lights when you go into a dark area. Replace any light bulbs as soon as they burn out. Set up your furniture so you have a clear path. Avoid moving your furniture around. If any of your floors are uneven, fix them. If there are any pets around you, be aware of where they are. Review your medicines with your doctor. Some medicines can make you feel dizzy. This can increase your chance of falling. Ask your doctor what other things that you can do to help prevent falls. This information is not intended to replace advice given to you by your health care provider. Make sure you discuss any questions you have with your health care provider. Document Released: 07/14/2009 Document Revised: 02/23/2016 Document Reviewed: 10/22/2014 Elsevier Interactive Patient Education  2017 [employer].

## 2023-04-24 NOTE — Progress Notes (Signed)
Subjective:   Richard Trevino is a 72 y.o. male who presents for Medicare Annual/Subsequent preventive examination.  Visit Complete: In person   Review of Systems     Cardiac Risk Factors include: advanced age (>43men, >55 women);dyslipidemia;family history of premature cardiovascular disease;hypertension;male gender     Objective:    Today's Vitals   04/24/23 1251  BP: 130/80  Pulse: 71  Temp: (!) 97.4 F (36.3 C)  TempSrc: Temporal  SpO2: 92%  Weight: 253 lb 9.6 oz (115 kg)  Height: 5\' 11"  (1.803 m)  PainSc: 0-No pain   Body mass index is 35.37 kg/m.     04/24/2023    1:04 PM 04/20/2022    2:36 PM  Advanced Directives  Does Patient Have a Medical Advance Directive? No No  Would patient like information on creating a medical advance directive? No - Patient declined No - Patient declined    Current Medications (verified) Outpatient Encounter Medications as of 04/24/2023  Medication Sig   acetaminophen (TYLENOL) 650 MG CR tablet Take 650 mg by mouth every 8 (eight) hours as needed for pain.   aspirin 325 MG EC tablet Take 325 mg by mouth daily.   Biotin 09811 MCG TABS Take by mouth.   Calcium Carb-Magnesium Carb (MAGNEBIND 400) 80-115 MG TABS Take 1 tablet by mouth daily.   Cholecalciferol (VITAMIN D3) 1000 UNITS CAPS Take by mouth daily.   Cholecalciferol 25 MCG (1000 UT) tablet Take by mouth.   Coenzyme Q10 (COQ10) 50 MG CAPS Take by mouth daily.   Cyanocobalamin (VITAMIN B 12 PO) Take by mouth.   diltiazem (TIAZAC) 180 MG 24 hr capsule Take 1 capsule by mouth daily.   ELIQUIS 5 MG TABS tablet Take 5 mg by mouth 2 (two) times daily.   fenofibrate (TRICOR) 145 MG tablet Take 1 tablet (145 mg total) by mouth daily.   Flaxseed, Linseed, (FLAX SEED OIL) 1000 MG CAPS Take by mouth.   FLUoxetine (PROZAC) 40 MG capsule TAKE ONE CAPSULE BY MOUTH DAILY   Glucosamine Sulfate 1000 MG TABS Take by mouth. 2 by mouth daily   hydrochlorothiazide (HYDRODIURIL) 25 MG tablet  TAKE 1/2 TABLET BY MOUTH DAILY   losartan (COZAAR) 25 MG tablet Take 25 mg by mouth daily.    Magnesium 400 MG TABS Take by mouth.   Melatonin 10 MG TABS Take by mouth.   metoprolol tartrate (LOPRESSOR) 25 MG tablet Take 12.5 mg by mouth 2 (two) times daily.    Multiple Vitamin (MULTIVITAMIN) tablet Take 1 tablet by mouth daily.   pantoprazole (PROTONIX) 40 MG tablet TAKE 1 TABLET BY MOUTH DAILY 30 MINUTES BEFORE BREAKFAST   pravastatin (PRAVACHOL) 20 MG tablet TAKE 1/2 TABLET BY MOUTH NIGHTLY AT BEDTIME   ranolazine (RANEXA) 500 MG 12 hr tablet Take 500 mg by mouth 2 (two) times daily.    tamsulosin (FLOMAX) 0.4 MG CAPS capsule TAKE 1 CAPSULE BY MOUTH DAILY   VITAMIN E PO Take by mouth.   Zinc 25 MG TABS Take by mouth.   No facility-administered encounter medications on file as of 04/24/2023.    Allergies (verified) Bee venom, Latex, and Niacin   History: Past Medical History:  Diagnosis Date   Fasting hyperglycemia    GERD (gastroesophageal reflux disease)    Gilbert's syndrome    Internal hemorrhoids    Multiple fracture [id number]   Coccyx, finger,hand   Rectal bleeding    Renal calculi    X 2   Skin cancer    ?  Squamous cell; Dr Landry Mellow   Past Surgical History:  Procedure Laterality Date   CHOLECYSTECTOMY  2003   Dr. Arcola Jansky    COLONOSCOPY  [id number]   negative   LITHOTRIPSY  2007   Dr Edwin Cap, [place]   POLYPECTOMY     UPPER GASTROINTESTINAL ENDOSCOPY  2003   Dr Russella Dar   Family History  Problem Relation Age of Onset   Emphysema Father    Hypertension Father    Stroke Father         < 49   Hypertension Mother    Arthritis Mother    Multiple myeloma Mother    Skin cancer Brother    Prostate cancer Brother    Stroke Brother         > 55   Depression Brother    Cirrhosis Brother    Heart attack Maternal Uncle        X2; both > 55   Prostate cancer Maternal Uncle    Diabetes Neg Hx    Colon cancer Neg Hx    Colon polyps Neg Hx    Esophageal cancer  Neg Hx    Stomach cancer Neg Hx    Rectal cancer Neg Hx    Social History   Socioeconomic History   Marital status: Married    Spouse name: Britta Mccreedy   Number of children: 3   Years of education: Not on file   Highest education level: Not on file  Occupational History   Occupation: truck Air traffic controller: [employer]  Tobacco Use   Smoking status: Former    Current packs/day: 0.00    Types: Cigarettes    Quit date: 10/02/1967    Years since quitting: 55.5   Smokeless tobacco: Never   Tobacco comments:    smoked 1968-69, up to 1/2 ppd  Vaping Use   Vaping status: Never Used  Substance and Sexual Activity   Alcohol use: No   Drug use: No   Sexual activity: Not on file  Other Topics Concern   Not on file  Social History Narrative   Not on file   Social Determinants of Health   Financial Resource Strain: Low Risk  (04/24/2023)   Overall Financial Resource Strain (CARDIA)    Difficulty of Paying Living Expenses: Not hard at all  Food Insecurity: No Food Insecurity (04/24/2023)   Hunger Vital Sign    Worried About Running Out of Food in the Last Year: Never true    Ran Out of Food in the Last Year: Never true  Transportation Needs: No Transportation Needs (04/24/2023)   PRAPARE - Administrator, Civil Service (Medical): No    Lack of Transportation (Non-Medical): No  Physical Activity: Insufficiently Active (04/24/2023)   Exercise Vital Sign    Days of Exercise per Week: 3 days    Minutes of Exercise per Session: 30 min  Stress: No Stress Concern Present (04/24/2023)   [employer] of Occupational Health - Occupational Stress Questionnaire    Feeling of Stress : Not at all  Social Connections: Moderately Integrated (04/24/2023)   Social Connection and Isolation Panel [NHANES]    Frequency of Communication with Friends and Family: Three times a week    Frequency of Social Gatherings with Friends and Family: Three times a week    Attends Religious Services:  1 to 4 times per year    Active Member of Clubs or Organizations: No    Attends Banker Meetings: Never  Marital Status: Married    Tobacco Counseling Counseling given: Not Answered Tobacco comments: smoked 1968-69, up to 1/2 ppd   Clinical Intake:  Pre-visit preparation completed: Yes  Pain : No/denies pain Pain Score: 0-No pain     BMI - recorded: 35.57 Nutritional Status: BMI > 30  Obese Nutritional Risks: None Diabetes: No  How often do you need to have someone help you when you read instructions, pamphlets, or other written materials from your doctor or pharmacy?: 1 - Never What is the last grade level you completed in school?: HSG; Retired Nurse, learning disability Needed?: No  Information entered by :: [employer]. Daiquan Resnik, LPN.   Activities of Daily Living    04/24/2023   12:53 PM  In your present state of health, do you have any difficulty performing the following activities:  Hearing? 0  Vision? 0  Difficulty concentrating or making decisions? 0  Walking or climbing stairs? 0  Dressing or bathing? 0  Doing errands, shopping? 0  Preparing Food and eating ? N  Using the Toilet? N  In the past six months, have you accidently leaked urine? N  Do you have problems with loss of bowel control? N  Managing your Medications? N  Managing your Finances? N  Housekeeping or managing your Housekeeping? N    Patient Care Team: Pincus Sanes, MD as PCP - General (Internal Medicine) Kathyrn Sheriff, [facility] (Inactive) as Pharmacist (Pharmacist) [facility] as Consulting Physician (Optometry)  Indicate any recent Medical Services you may have received from other than [facility] providers in the past year (date may be approximate).     Assessment:   This is a routine wellness examination for Uzziah.  Hearing/Vision screen Hearing Screening - Comments:: Patient denied any hearing difficulty.   No hearing aids.  Vision Screening - Comments::  Patient does wear corrective lenses/contacts.  Annual eye exam done by: [facility]   Dietary issues and exercise activities discussed:     Goals Addressed             This Visit's Progress    Client understands the importance of follow-up with providers by attending scheduled visits.       I would love to lose some weight.  My weight goal is to be 200 pounds.      Depression Screen    04/24/2023   12:51 PM 04/20/2022    2:40 PM 04/20/2022    2:37 PM 04/20/2022    2:35 PM 12/08/2021   10:53 AM 11/23/2020    1:42 PM 05/11/2019    8:16 AM  PHQ 2/9 Scores  PHQ - 2 Score 0 0 0 0 0 0 0  PHQ- 9 Score 0          Fall Risk    04/24/2023   12:52 PM 04/20/2022    2:37 PM 12/08/2021   10:53 AM 11/23/2020    1:43 PM 11/12/2019    8:20 AM  Fall Risk   Falls in the past year? 0 0 1 0 1  Number falls in past yr: 0 0 0 0 0  Comment     fell when cutting limb off tree  Injury with Fall? 0 0 1 0 0  Risk for fall due to : No Fall Risks  No Fall Risks No Fall Risks History of fall(s)  Follow up Falls prevention discussed Falls evaluation completed;Education provided Falls evaluation completed Falls evaluation completed     MEDICARE RISK AT HOME:  Medicare Risk at Home - 04/24/23 1253     Any stairs in or around the home? No    If so, are there any without handrails? No    Home free of loose throw rugs in walkways, pet beds, electrical cords, etc? Yes    Adequate lighting in your home to reduce risk of falls? Yes    Life alert? No    Use of a cane, walker or w/c? No    Grab bars in the bathroom? No    Shower chair or bench in shower? No    Elevated toilet seat or a handicapped toilet? No             TIMED UP AND GO:  Was the test performed?  Yes  Length of time to ambulate 10 feet: 8 sec Gait steady and fast without use of assistive device    Cognitive Function:        04/24/2023   12:53 PM  6CIT Screen  What Year? 0 points  What month? 0 points   What time? 0 points  Count back from 20 0 points  Months in reverse 0 points  Repeat phrase 0 points  Total Score 0 points    Immunizations Immunization History  Administered Date(s) Administered   Janssen (J&J) SARS-COV-2 Vaccination 01/13/2020   Td 03/30/2005   Tdap 06/20/2015    TDAP status: Up to date  Flu Vaccine status: Declined, Education has been provided regarding the importance of this vaccine but patient still declined. Advised may receive this vaccine at local pharmacy or Health Dept. Aware to provide a copy of the vaccination record if obtained from local pharmacy or Health Dept. Verbalized acceptance and understanding.  Pneumococcal vaccine status: Declined,  Education has been provided regarding the importance of this vaccine but patient still declined. Advised may receive this vaccine at local pharmacy or Health Dept. Aware to provide a copy of the vaccination record if obtained from local pharmacy or Health Dept. Verbalized acceptance and understanding.   Covid-19 vaccine status: Declined, Education has been provided regarding the importance of this vaccine but patient still declined. Advised may receive this vaccine at local pharmacy or Health Dept.or vaccine clinic. Aware to provide a copy of the vaccination record if obtained from local pharmacy or Health Dept. Verbalized acceptance and understanding.  Qualifies for Shingles Vaccine? Yes   Zostavax completed No   Shingrix Completed?: No.    Education has been provided regarding the importance of this vaccine. Patient has been advised to call insurance company to determine out of pocket expense if they have not yet received this vaccine. Advised may also receive vaccine at local pharmacy or Health Dept. Verbalized acceptance and understanding.  Screening Tests Health Maintenance  Topic Date Due   Pneumonia Vaccine 38+ Years old (1 of 1 - PCV) Never done   COVID-19 Vaccine (2 - Janssen risk series) 02/10/2020    Colonoscopy  09/02/2022   Zoster Vaccines- Shingrix (1 of 2) 08/26/2054 (Originally 06/11/1970)   INFLUENZA VACCINE  05/02/2023   Medicare Annual Wellness (AWV)  04/23/2024   DTaP/Tdap/Td (3 - Td or Tdap) 06/19/2025   Hepatitis C Screening  Completed   HPV VACCINES  Aged Out    Health Maintenance  Health Maintenance Due  Topic Date Due   Pneumonia Vaccine 4+ Years old (1 of 1 - PCV) Never done   COVID-19 Vaccine (2 - Janssen risk series) 02/10/2020   Colonoscopy  09/02/2022    Colorectal cancer screening:  Type of screening: Colonoscopy. Completed 09/03/2019. Repeat every 3 years (Patient will discuss with PCP at next physical).  Lung Cancer Screening: (Low Dose CT Chest recommended if Age 53-80 years, 20 pack-year currently smoking OR have quit w/in 15years.) does not qualify.   Lung Cancer Screening Referral: no   Additional Screening:  Hepatitis C Screening: does qualify; Completed 10/24/2016  Vision Screening: Recommended annual ophthalmology exams for early detection of glaucoma and other disorders of the eye. Is the patient up to date with their annual eye exam?  Yes  Who is the provider or what is the name of the office in which the patient attends annual eye exams? [facility] If pt is not established with a provider, would they like to be referred to a provider to establish care? No .   Dental Screening: Recommended annual dental exams for proper oral hygiene  Diabetic Foot Exam: N/A  Community Resource Referral / Chronic Care Management: CRR required this visit?  No   CCM required this visit?  No     Plan:     I have personally reviewed and noted the following in the patient's chart:   Medical and social history Use of alcohol, tobacco or illicit drugs  Current medications and supplements including opioid prescriptions. Patient is not currently taking opioid prescriptions. Functional ability and status Nutritional status Physical  activity Advanced directives List of other physicians Hospitalizations, surgeries, and ER visits in previous 12 months Vitals Screenings to include cognitive, depression, and falls Referrals and appointments  In addition, I have reviewed and discussed with patient certain preventive protocols, quality metrics, and best practice recommendations. A written personalized care plan for preventive services as well as general preventive health recommendations were provided to patient.     Mickeal Needy, LPN   04/25/3663   After Visit Summary: Printed and given to patient at visit.  Nurse Notes: Normal cognitive status assessed by direct observation by this Nurse Health Advisor. No abnormalities found.

## 2023-05-16 ENCOUNTER — Encounter (INDEPENDENT_AMBULATORY_CARE_PROVIDER_SITE_OTHER): Payer: Self-pay

## 2023-05-22 DIAGNOSIS — K219 Gastro-esophageal reflux disease without esophagitis: Secondary | ICD-10-CM | POA: Diagnosis not present

## 2023-05-22 DIAGNOSIS — I4892 Unspecified atrial flutter: Secondary | ICD-10-CM | POA: Diagnosis not present

## 2023-05-22 DIAGNOSIS — I129 Hypertensive chronic kidney disease with stage 1 through stage 4 chronic kidney disease, or unspecified chronic kidney disease: Secondary | ICD-10-CM | POA: Diagnosis not present

## 2023-05-22 DIAGNOSIS — Z9889 Other specified postprocedural states: Secondary | ICD-10-CM | POA: Diagnosis not present

## 2023-05-25 ENCOUNTER — Other Ambulatory Visit: Payer: Self-pay | Admitting: Internal Medicine

## 2023-06-18 ENCOUNTER — Encounter: Payer: Self-pay | Admitting: Internal Medicine

## 2023-06-18 NOTE — Patient Instructions (Addendum)
Blood work was ordered.   The lab is on the first floor.    Medications changes include :   none     Return in about 6 months (around 12/17/2023) for follow up.    Health Maintenance, Male Adopting a healthy lifestyle and getting preventive care are important in promoting health and wellness. Ask your health care provider about: The right schedule for you to have regular tests and exams. Things you can do on your own to prevent diseases and keep yourself healthy. What should I know about diet, weight, and exercise? Eat a healthy diet  Eat a diet that includes plenty of vegetables, fruits, low-fat dairy products, and lean protein. Do not eat a lot of foods that are high in solid fats, added sugars, or sodium. Maintain a healthy weight Body mass index (BMI) is a measurement that can be used to identify possible weight problems. It estimates body fat based on height and weight. Your health care provider can help determine your BMI and help you achieve or maintain a healthy weight. Get regular exercise Get regular exercise. This is one of the most important things you can do for your health. Most adults should: Exercise for at least 150 minutes each week. The exercise should increase your heart rate and make you sweat (moderate-intensity exercise). Do strengthening exercises at least twice a week. This is in addition to the moderate-intensity exercise. Spend less time sitting. Even light physical activity can be beneficial. Watch cholesterol and blood lipids Have your blood tested for lipids and cholesterol at 71 years of age, then have this test every 5 years. You may need to have your cholesterol levels checked more often if: Your lipid or cholesterol levels are high. You are older than 71 years of age. You are at high risk for heart disease. What should I know about cancer screening? Many types of cancers can be detected early and may often be prevented. Depending on your  health history and family history, you may need to have cancer screening at various ages. This may include screening for: Colorectal cancer. Prostate cancer. Skin cancer. Lung cancer. What should I know about heart disease, diabetes, and high blood pressure? Blood pressure and heart disease High blood pressure causes heart disease and increases the risk of stroke. This is more likely to develop in people who have high blood pressure readings or are overweight. Talk with your health care provider about your target blood pressure readings. Have your blood pressure checked: Every 3-5 years if you are 15-53 years of age. Every year if you are 98 years old or older. If you are between the ages of 5 and 90 and are a current or former smoker, ask your health care provider if you should have a one-time screening for abdominal aortic aneurysm (AAA). Diabetes Have regular diabetes screenings. This checks your fasting blood sugar level. Have the screening done: Once every three years after age 17 if you are at a normal weight and have a low risk for diabetes. More often and at a younger age if you are overweight or have a high risk for diabetes. What should I know about preventing infection? Hepatitis B If you have a higher risk for hepatitis B, you should be screened for this virus. Talk with your health care provider to find out if you are at risk for hepatitis B infection. Hepatitis C Blood testing is recommended for: Everyone born from [birth year] through 1964. Anyone with known  risk factors for hepatitis C. Sexually transmitted infections (STIs) You should be screened each year for STIs, including gonorrhea and chlamydia, if: You are sexually active and are younger than 71 years of age. You are older than 71 years of age and your health care provider tells you that you are at risk for this type of infection. Your sexual activity has changed since you were last screened, and you are at increased risk  for chlamydia or gonorrhea. Ask your health care provider if you are at risk. Ask your health care provider about whether you are at high risk for HIV. Your health care provider may recommend a prescription medicine to help prevent HIV infection. If you choose to take medicine to prevent HIV, you should first get tested for HIV. You should then be tested every 3 months for as long as you are taking the medicine. Follow these instructions at home: Alcohol use Do not drink alcohol if your health care provider tells you not to drink. If you drink alcohol: Limit how much you have to 0-2 drinks a day. Know how much alcohol is in your drink. In the [place] one drink equals one 12 oz bottle of beer (355 mL), one 5 oz glass of wine (148 mL), or one 1 oz glass of hard liquor (44 mL). Lifestyle Do not use any products that contain nicotine or tobacco. These products include cigarettes, chewing tobacco, and vaping devices, such as e-cigarettes. If you need help quitting, ask your health care provider. Do not use street drugs. Do not share needles. Ask your health care provider for help if you need support or information about quitting drugs. General instructions Schedule regular health, dental, and eye exams. Stay current with your vaccines. Tell your health care provider if: You often feel depressed. You have ever been abused or do not feel safe at home. Summary Adopting a healthy lifestyle and getting preventive care are important in promoting health and wellness. Follow your health care provider's instructions about healthy diet, exercising, and getting tested or screened for diseases. Follow your health care provider's instructions on monitoring your cholesterol and blood pressure. This information is not intended to replace advice given to you by your health care provider. Make sure you discuss any questions you have with your health care provider. Document Revised: 02/06/2021 Document Reviewed:  02/06/2021 Elsevier Patient Education  2024 [employer].

## 2023-06-18 NOTE — Progress Notes (Unsigned)
Subjective:    Patient ID: Richard Trevino, male    DOB: 01/09/1951, 72 y.o.   MRN: [id number]     HPI Richard Trevino is here for a physical exam and his chronic medical problems.      Medications and allergies reviewed with patient and updated if appropriate.  Current Outpatient Medications on File Prior to Visit  Medication Sig Dispense Refill   acetaminophen (TYLENOL) 650 MG CR tablet Take 650 mg by mouth every 8 (eight) hours as needed for pain.     aspirin 325 MG EC tablet Take 325 mg by mouth daily.     Biotin 78295 MCG TABS Take by mouth.     Calcium Carb-Magnesium Carb (MAGNEBIND 400) 80-115 MG TABS Take 1 tablet by mouth daily.     Cholecalciferol (VITAMIN D3) 1000 UNITS CAPS Take by mouth daily.     Cholecalciferol 25 MCG (1000 UT) tablet Take by mouth.     Coenzyme Q10 (COQ10) 50 MG CAPS Take by mouth daily.     Cyanocobalamin (VITAMIN B 12 PO) Take by mouth.     diltiazem (TIAZAC) 180 MG 24 hr capsule Take 1 capsule by mouth daily.     ELIQUIS 5 MG TABS tablet Take 5 mg by mouth 2 (two) times daily.     fenofibrate (TRICOR) 145 MG tablet TAKE 1 TABLET BY MOUTH DAILY 90 tablet 3   Flaxseed, Linseed, (FLAX SEED OIL) 1000 MG CAPS Take by mouth.     FLUoxetine (PROZAC) 40 MG capsule TAKE ONE CAPSULE BY MOUTH DAILY 90 capsule 3   Glucosamine Sulfate 1000 MG TABS Take by mouth. 2 by mouth daily     hydrochlorothiazide (HYDRODIURIL) 25 MG tablet TAKE 1/2 TABLET BY MOUTH DAILY 45 tablet 1   losartan (COZAAR) 25 MG tablet Take 25 mg by mouth daily.      Magnesium 400 MG TABS Take by mouth.     Melatonin 10 MG TABS Take by mouth.     metoprolol tartrate (LOPRESSOR) 25 MG tablet Take 12.5 mg by mouth 2 (two) times daily.      Multiple Vitamin (MULTIVITAMIN) tablet Take 1 tablet by mouth daily.     pantoprazole (PROTONIX) 40 MG tablet TAKE 1 TABLET BY MOUTH DAILY 30 MINUTES BEFORE BREAKFAST 90 tablet 1   pravastatin (PRAVACHOL) 20 MG tablet TAKE 1/2 TABLET BY MOUTH NIGHTLY AT  BEDTIME 45 tablet 1   ranolazine (RANEXA) 500 MG 12 hr tablet Take 500 mg by mouth 2 (two) times daily.      tamsulosin (FLOMAX) 0.4 MG CAPS capsule TAKE 1 CAPSULE BY MOUTH DAILY 90 capsule 1   VITAMIN E PO Take by mouth.     Zinc 25 MG TABS Take by mouth.     No current facility-administered medications on file prior to visit.    Review of Systems     Objective:  There were no vitals filed for this visit. There were no vitals filed for this visit. There is no height or weight on file to calculate BMI.  BP Readings from Last 3 Encounters:  04/24/23 130/80  05/30/22 136/80  11/27/21 130/80    Wt Readings from Last 3 Encounters:  04/24/23 253 lb 9.6 oz (115 kg)  05/30/22 268 lb (121.6 kg)  11/27/21 260 lb (117.9 kg)      Physical Exam Constitutional: He appears well-developed and well-nourished. No distress.  HENT:  Head: Normocephalic and atraumatic.  Right Ear: External ear normal.  Left Ear: External  ear normal.  Normal ear canals and TM b/l  Mouth/Throat: Oropharynx is clear and moist. Eyes: Conjunctivae and EOM are normal.  Neck: Neck supple. No tracheal deviation present. No thyromegaly present.  No carotid bruit  Cardiovascular: Normal rate, regular rhythm, normal heart sounds and intact distal pulses.   No murmur heard.  No lower extremity edema. Pulmonary/Chest: Effort normal and breath sounds normal. No respiratory distress. He has no wheezes. He has no rales.  Abdominal: Soft. He exhibits no distension. There is no tenderness.  Genitourinary: deferred  Lymphadenopathy:   He has no cervical adenopathy.  Skin: Skin is warm and dry. He is not diaphoretic.  Psychiatric: He has a normal mood and affect. His behavior is normal.         Assessment & Plan:   Physical exam: Screening blood work  ordered Exercise    Weight   Substance abuse   none   Reviewed recommended immunizations.   Health Maintenance  Topic Date Due   Pneumonia Vaccine 65+ Years  old (1 of 1 - PCV) Never done   Colonoscopy  09/02/2022   INFLUENZA VACCINE  Never done   COVID-19 Vaccine (2 - 2023-24 season) 06/02/2023   Zoster Vaccines- Shingrix (1 of 2) 08/26/2054 (Originally 06/10/2002)   Medicare Annual Wellness (AWV)  04/23/2024   DTaP/Tdap/Td (3 - Td or Tdap) 06/19/2025   Hepatitis C Screening  Completed   HPV VACCINES  Aged Out     See Problem List for Assessment and Plan of chronic medical problems.

## 2023-06-19 ENCOUNTER — Ambulatory Visit (INDEPENDENT_AMBULATORY_CARE_PROVIDER_SITE_OTHER): Payer: Medicare Other | Admitting: Internal Medicine

## 2023-06-19 VITALS — BP 130/84 | HR 91 | Temp 98.3°F | Ht 71.0 in | Wt 253.0 lb

## 2023-06-19 DIAGNOSIS — R5383 Other fatigue: Secondary | ICD-10-CM

## 2023-06-19 DIAGNOSIS — Z125 Encounter for screening for malignant neoplasm of prostate: Secondary | ICD-10-CM

## 2023-06-19 DIAGNOSIS — Z0000 Encounter for general adult medical examination without abnormal findings: Secondary | ICD-10-CM

## 2023-06-19 DIAGNOSIS — N401 Enlarged prostate with lower urinary tract symptoms: Secondary | ICD-10-CM

## 2023-06-19 DIAGNOSIS — K219 Gastro-esophageal reflux disease without esophagitis: Secondary | ICD-10-CM

## 2023-06-19 DIAGNOSIS — I10 Essential (primary) hypertension: Secondary | ICD-10-CM | POA: Diagnosis not present

## 2023-06-19 DIAGNOSIS — R7303 Prediabetes: Secondary | ICD-10-CM

## 2023-06-19 DIAGNOSIS — E785 Hyperlipidemia, unspecified: Secondary | ICD-10-CM

## 2023-06-19 DIAGNOSIS — F3289 Other specified depressive episodes: Secondary | ICD-10-CM

## 2023-06-19 DIAGNOSIS — N1831 Chronic kidney disease, stage 3a: Secondary | ICD-10-CM

## 2023-06-19 DIAGNOSIS — I4892 Unspecified atrial flutter: Secondary | ICD-10-CM | POA: Diagnosis not present

## 2023-06-19 LAB — COMPREHENSIVE METABOLIC PANEL WITH GFR
ALT: 32 U/L (ref 0–53)
AST: 45 U/L — ABNORMAL HIGH (ref 0–37)
Albumin: 4.6 g/dL (ref 3.5–5.2)
Alkaline Phosphatase: 70 U/L (ref 39–117)
BUN: 18 mg/dL (ref 6–23)
CO2: 26 meq/L (ref 19–32)
Calcium: 9.7 mg/dL (ref 8.4–10.5)
Chloride: 103 meq/L (ref 96–112)
Creatinine, Ser: 1.4 mg/dL (ref 0.40–1.50)
GFR: 50.74 mL/min — ABNORMAL LOW (ref >60.00)
Glucose, Bld: 106 mg/dL — ABNORMAL HIGH (ref 70–99)
Potassium: 4.1 meq/L (ref 3.5–5.1)
Sodium: 138 meq/L (ref 135–145)
Total Bilirubin: 1.1 mg/dL (ref 0.2–1.2)
Total Protein: 6.9 g/dL (ref 6.0–8.3)

## 2023-06-19 LAB — CBC WITH DIFFERENTIAL/PLATELET
Basophils Absolute: 0 10*3/uL (ref 0.0–0.1)
Basophils Relative: 0.6 % (ref 0.0–3.0)
Eosinophils Absolute: 0.1 10*3/uL (ref 0.0–0.7)
Eosinophils Relative: 2.4 % (ref 0.0–5.0)
HCT: 46.2 % (ref 39.0–52.0)
Hemoglobin: 15.7 g/dL (ref 13.0–17.0)
Lymphocytes Relative: 18.9 % (ref 12.0–46.0)
Lymphs Abs: 1 10*3/uL (ref 0.7–4.0)
MCHC: 34 g/dL (ref 30.0–36.0)
MCV: 95 fl (ref 78.0–100.0)
Monocytes Absolute: 0.5 10*3/uL (ref 0.1–1.0)
Monocytes Relative: 10 % (ref 3.0–12.0)
Neutro Abs: 3.7 10*3/uL (ref 1.4–7.7)
Neutrophils Relative %: 68.1 % (ref 43.0–77.0)
Platelets: 186 10*3/uL (ref 150.0–400.0)
RBC: 4.87 Mil/uL (ref 4.22–5.81)
RDW: 12.3 % (ref 11.5–15.5)
WBC: 5.5 10*3/uL (ref 4.0–10.5)

## 2023-06-19 LAB — PSA, MEDICARE: PSA: 0.67 ng/mL (ref 0.10–4.00)

## 2023-06-19 LAB — LDL CHOLESTEROL, DIRECT: Direct LDL: 130 mg/dL

## 2023-06-19 LAB — LIPID PANEL
Cholesterol: 190 mg/dL (ref 0–200)
HDL: 27.6 mg/dL — ABNORMAL LOW (ref >39.00)
Total CHOL/HDL Ratio: 7
Triglycerides: 621 mg/dL — ABNORMAL HIGH (ref 0.0–149.0)

## 2023-06-19 LAB — TSH: TSH: 3.72 u[IU]/mL (ref 0.35–5.50)

## 2023-06-19 LAB — HEMOGLOBIN A1C: Hgb A1c MFr Bld: 5.9 % (ref 4.6–6.5)

## 2023-06-19 NOTE — Assessment & Plan Note (Addendum)
Chronic Controlled, Stable Does not feel his fatigue is related to his depression Continue fluoxetine 40 mg daily

## 2023-06-19 NOTE — Assessment & Plan Note (Signed)
Chronic GFR improved CBC, CMP

## 2023-06-19 NOTE — Assessment & Plan Note (Signed)
Chronic Check a1c Low sugar / carb diet Stressed regular exercise   

## 2023-06-19 NOTE — Assessment & Plan Note (Signed)
New Going on for 6 months Denies depression - feels it is controlled ? Sleep sufficient - ? OSA - advised to consider eval for OSA Check labs - cbc, cmp, tsh Stressed regular exercise

## 2023-06-19 NOTE — Assessment & Plan Note (Signed)
Chronic GERD controlled Continue pantoprazole 40 mg daily 

## 2023-06-19 NOTE — Assessment & Plan Note (Signed)
Chronic Following with cardiology Had radiofrequency ablation 06/2020 Has had episodes after ablation Continue Eliquis 5 mg twice daily, diltiazem 180 mg daily, metoprolol 12.5 mg twice daily

## 2023-06-19 NOTE — Assessment & Plan Note (Signed)
Chronic Blood pressure well controlled CMP Continue diltiazem 180 mg daily, HCTZ 25 mg daily, losartan 25 mg daily, metoprolol 12.5 mg twice daily

## 2023-06-19 NOTE — Assessment & Plan Note (Signed)
Chronic Regular exercise and healthy diet encouraged Check lipid panel, cmp Continue pravastatin 20 mg daily

## 2023-06-19 NOTE — Assessment & Plan Note (Addendum)
Chronic Has some difficulty with urination/emptying bladder - and increased nocturia Stopped seeing urology Deferred increasing tamsulosin to 2 tabs daily Continue tamsulosin 0.4 mg daily

## 2023-06-22 ENCOUNTER — Other Ambulatory Visit: Payer: Self-pay | Admitting: Internal Medicine

## 2023-07-04 DIAGNOSIS — K080 Exfoliation of teeth due to systemic causes: Secondary | ICD-10-CM | POA: Diagnosis not present

## 2023-08-14 DIAGNOSIS — L821 Other seborrheic keratosis: Secondary | ICD-10-CM | POA: Diagnosis not present

## 2023-08-14 DIAGNOSIS — L820 Inflamed seborrheic keratosis: Secondary | ICD-10-CM | POA: Diagnosis not present

## 2023-08-14 DIAGNOSIS — Z85828 Personal history of other malignant neoplasm of skin: Secondary | ICD-10-CM | POA: Diagnosis not present

## 2023-08-14 DIAGNOSIS — D225 Melanocytic nevi of trunk: Secondary | ICD-10-CM | POA: Diagnosis not present

## 2023-08-14 DIAGNOSIS — L570 Actinic keratosis: Secondary | ICD-10-CM | POA: Diagnosis not present

## 2023-08-14 DIAGNOSIS — L578 Other skin changes due to chronic exposure to nonionizing radiation: Secondary | ICD-10-CM | POA: Diagnosis not present

## 2023-09-03 DIAGNOSIS — S199XXA Unspecified injury of neck, initial encounter: Secondary | ICD-10-CM | POA: Diagnosis not present

## 2023-09-03 DIAGNOSIS — S0001XA Abrasion of scalp, initial encounter: Secondary | ICD-10-CM | POA: Diagnosis not present

## 2023-09-03 DIAGNOSIS — Z7901 Long term (current) use of anticoagulants: Secondary | ICD-10-CM | POA: Diagnosis not present

## 2023-09-03 DIAGNOSIS — M47812 Spondylosis without myelopathy or radiculopathy, cervical region: Secondary | ICD-10-CM | POA: Diagnosis not present

## 2023-09-03 DIAGNOSIS — W109XXA Fall (on) (from) unspecified stairs and steps, initial encounter: Secondary | ICD-10-CM | POA: Diagnosis not present

## 2023-09-03 DIAGNOSIS — S0003XA Contusion of scalp, initial encounter: Secondary | ICD-10-CM | POA: Diagnosis not present

## 2023-09-03 DIAGNOSIS — S300XXA Contusion of lower back and pelvis, initial encounter: Secondary | ICD-10-CM | POA: Diagnosis not present

## 2023-09-03 DIAGNOSIS — R58 Hemorrhage, not elsewhere classified: Secondary | ICD-10-CM | POA: Diagnosis not present

## 2023-09-03 DIAGNOSIS — R519 Headache, unspecified: Secondary | ICD-10-CM | POA: Diagnosis not present

## 2023-09-03 DIAGNOSIS — E669 Obesity, unspecified: Secondary | ICD-10-CM | POA: Diagnosis not present

## 2023-09-03 DIAGNOSIS — I480 Paroxysmal atrial fibrillation: Secondary | ICD-10-CM | POA: Diagnosis not present

## 2023-09-03 DIAGNOSIS — R296 Repeated falls: Secondary | ICD-10-CM | POA: Diagnosis not present

## 2023-09-03 DIAGNOSIS — M545 Low back pain, unspecified: Secondary | ICD-10-CM | POA: Diagnosis not present

## 2023-09-03 DIAGNOSIS — R918 Other nonspecific abnormal finding of lung field: Secondary | ICD-10-CM | POA: Diagnosis not present

## 2023-09-06 ENCOUNTER — Encounter: Payer: Self-pay | Admitting: Internal Medicine

## 2023-09-06 ENCOUNTER — Ambulatory Visit (INDEPENDENT_AMBULATORY_CARE_PROVIDER_SITE_OTHER): Payer: Medicare Other | Admitting: Internal Medicine

## 2023-09-06 VITALS — BP 132/72 | HR 54 | Temp 98.0°F | Ht 71.0 in

## 2023-09-06 DIAGNOSIS — S060X0A Concussion without loss of consciousness, initial encounter: Secondary | ICD-10-CM | POA: Insufficient documentation

## 2023-09-06 DIAGNOSIS — S300XXA Contusion of lower back and pelvis, initial encounter: Secondary | ICD-10-CM | POA: Diagnosis not present

## 2023-09-06 DIAGNOSIS — M545 Low back pain, unspecified: Secondary | ICD-10-CM | POA: Insufficient documentation

## 2023-09-06 NOTE — Assessment & Plan Note (Signed)
Acute Related to fall 12/3 No LOC Imaging in ED  - no bleed Has confusion, dizzy, changes in vision,fatigue I am concerned about this above symptoms and the fact that he did not stop his a/c - advised ED for re-imaging but he deferred -- symptoms have not worsened in the past 1-2 days F/u with sports medicine Monday  Advised family to have low threshold to take him to ED over the weekend for worsening of symptoms

## 2023-09-06 NOTE — Progress Notes (Signed)
Subjective:    Patient ID: Richard Trevino, male    DOB: 1951-06-08, 72 y.o.   MRN: [id number]      HPI Richard Trevino is here for  Chief Complaint  Patient presents with   Fall   Asthma    Follow up from fall on 09/03/23  He is here with his daughter who helps supplement history  12/3 - going [place] steps [place] the barn-  fell down 7 steps and landed on concrete.  He had gotten up and was sitting there.  Hit head in back. Denies LOC.  Bruise at base of skull.  Back is black and blue - has gotten worse   - ct neg for acute bleed in head, negative for fractures of spine(ED in [place])   no Loc.  There is some confusion.  Vision - everything is so bright - white and black colors look purple, dizzy  Increased bruising lower back - increased pressure and pain  Using lidocaine patches.  Not taking tylenol or oxycodone.     Medications and allergies reviewed with patient and updated if appropriate.  Current Outpatient Medications on File Prior to Visit  Medication Sig Dispense Refill   acetaminophen (TYLENOL) 650 MG CR tablet Take 650 mg by mouth every 8 (eight) hours as needed for pain.     aspirin 325 MG EC tablet Take 325 mg by mouth daily.     Biotin 84132 MCG TABS Take by mouth.     Calcium Carb-Magnesium Carb (MAGNEBIND 400) 80-115 MG TABS Take 1 tablet by mouth daily.     Cholecalciferol (VITAMIN D3) 1000 UNITS CAPS Take by mouth daily.     Cholecalciferol 25 MCG (1000 UT) tablet Take by mouth.     Coenzyme Q10 (COQ10) 50 MG CAPS Take by mouth daily.     Cyanocobalamin (VITAMIN B 12 PO) Take by mouth.     diltiazem (TIAZAC) 180 MG 24 hr capsule Take 1 capsule by mouth daily.     ELIQUIS 5 MG TABS tablet Take 5 mg by mouth 2 (two) times daily.     fenofibrate (TRICOR) 145 MG tablet TAKE 1 TABLET BY MOUTH DAILY 90 tablet 3   Flaxseed, Linseed, (FLAX SEED OIL) 1000 MG CAPS Take by mouth.     FLUoxetine (PROZAC) 40 MG capsule TAKE 1 CAPSULE BY MOUTH DAILY 90 capsule 3    Glucosamine Sulfate 1000 MG TABS Take by mouth. 2 by mouth daily     hydrochlorothiazide (HYDRODIURIL) 25 MG tablet TAKE 1/2 TABLET BY MOUTH DAILY 45 tablet 1   losartan (COZAAR) 25 MG tablet Take 25 mg by mouth daily.      Magnesium 400 MG TABS Take by mouth.     Melatonin 10 MG TABS Take by mouth.     metoprolol tartrate (LOPRESSOR) 25 MG tablet Take 12.5 mg by mouth 2 (two) times daily.      Multiple Vitamin (MULTIVITAMIN) tablet Take 1 tablet by mouth daily.     pantoprazole (PROTONIX) 40 MG tablet TAKE 1 TABLET BY MOUTH DAILY 30 MINUTES BEFORE BREAKFAST 90 tablet 1   pravastatin (PRAVACHOL) 20 MG tablet TAKE 1/2 TABLET BY MOUTH NIGHTLY AT BEDTIME 45 tablet 1   ranolazine (RANEXA) 500 MG 12 hr tablet Take 500 mg by mouth 2 (two) times daily.      tamsulosin (FLOMAX) 0.4 MG CAPS capsule TAKE 1 CAPSULE BY MOUTH DAILY 90 capsule 1   VITAMIN E PO Take by mouth.  Zinc 25 MG TABS Take by mouth.     No current facility-administered medications on file prior to visit.    Review of Systems  Constitutional:  Positive for appetite change (decreased) and chills. Negative for fever.  Eyes:  Positive for visual disturbance.  Respiratory:  Positive for cough (mild - not more than usual) and shortness of breath (chronic - not more than usual). Negative for wheezing.   Cardiovascular:  Positive for chest pain (rib pain). Negative for leg swelling.  Gastrointestinal:  Positive for nausea (two days ago - none since then). Negative for abdominal pain.  Neurological:  Positive for dizziness, light-headedness and headaches.       Head feels heavy   Psychiatric/Behavioral:  Positive for confusion.        Objective:   Vitals:   09/06/23 1506  BP: 132/72  Pulse: (!) 54  Temp: 98 F (36.7 C)  SpO2: 98%   BP Readings from Last 3 Encounters:  09/06/23 132/72  06/19/23 130/84  04/24/23 130/80   Wt Readings from Last 3 Encounters:  06/19/23 253 lb (114.8 kg)  04/24/23 253 lb 9.6 oz (115 kg)   05/30/22 268 lb (121.6 kg)   Body mass index is 35.29 kg/m.    Physical Exam Constitutional:      General: He is not in acute distress.    Appearance: Normal appearance. He is not ill-appearing.     Comments: Alert, but intermittent dosing off, answering questions appropriately  HENT:     Head: Normocephalic.     Comments: Apricot size hematoma posterior head - tender - slightly scabbed w/o active bleeding.  Small bruise base of skull - non tender Abdominal:     General: There is no distension.     Palpations: Abdomen is soft.     Tenderness: There is no abdominal tenderness.  Musculoskeletal:        General: Tenderness (lumbar spine hematoma) present.     Right lower leg: No edema.     Left lower leg: No edema.     Comments: No tenderness posterior neck  Skin:    General: Skin is warm and dry.     Findings: Bruising (severe bruising lower back with swelling in small of back with tiny blister formation from excessive fluid in soft tissue) present.  Neurological:     General: No focal deficit present.     Mental Status: He is oriented to person, place, and time.     Sensory: No sensory deficit.     Motor: Weakness (generalized weakness in b/l legs) present.        Reviewed imaging and ED records from care everywhere    Assessment & Plan:    See Problem List for Assessment and Plan of chronic medical problems.

## 2023-09-06 NOTE — Patient Instructions (Addendum)
    Appt with Dr Jean Rosenthal Monday - 4pm - concussion and large hematoma lower back    Medications changes include :   stop aspirin.  Stop eliquis x 2 days     Update me on Monday

## 2023-09-06 NOTE — Assessment & Plan Note (Signed)
Acute related to trauma of fall and hematoma Imaging in ED r/o fracture of spine Hematoma is severe and a significant source of his pain He was not advised to stop his eliquis and ASA so this likely made the hematoma worse Stop eliquis and ASA Deferred going to ED tonight for re-imaging to r/o active bleed, compression syndrome F/u sports med monday

## 2023-09-06 NOTE — Assessment & Plan Note (Signed)
Acute Related to fall 12/3 Severe in nature - has not stopped a/c so hematoma has gotten worse Advised to stop asa and eliquis Deferred further imaging tonight - concern evacuation may be necessary or other internal imaging, active bleed Re-evaluate Monday with sports med Low threshold to take him to the ED over the weekend for worsening symptoms

## 2023-09-09 ENCOUNTER — Ambulatory Visit: Payer: Medicare Other | Admitting: Sports Medicine

## 2023-09-09 VITALS — BP 122/82 | HR 78 | Ht 71.0 in | Wt 253.0 lb

## 2023-09-09 DIAGNOSIS — M545 Low back pain, unspecified: Secondary | ICD-10-CM

## 2023-09-09 DIAGNOSIS — S060X0A Concussion without loss of consciousness, initial encounter: Secondary | ICD-10-CM | POA: Diagnosis not present

## 2023-09-09 DIAGNOSIS — Z7901 Long term (current) use of anticoagulants: Secondary | ICD-10-CM | POA: Diagnosis not present

## 2023-09-09 DIAGNOSIS — T148XXA Other injury of unspecified body region, initial encounter: Secondary | ICD-10-CM

## 2023-09-09 NOTE — Progress Notes (Signed)
Aleen Sells D.[facility] [address] [place] [address] Phone: [phone]  Assessment and Plan:     1. Concussion without loss of consciousness, initial encounter -Acute, complicated, initial sports medicine visit - Consistent with concussion based on HPI, symptom severity score, special testing, ER and internal medicine notes -Recommend no driving until reevaluated  2. Acute bilateral low back pain without sciatica 3. Hematoma 4. Chronic anticoagulation  -Acute, complicated, initial visit - Patient with large hematoma over low back with ecchymosis across entirety of lumbar spine, radiating to flanks, pain radiating to left lower abdomen.  Patient was on Eliquis for chronic anticoagulation for history of atrial flutter when he fell on 09/03/2023 and continued medication until evaluated by internal medicine on 09/06/2023 and was instructed to discontinue. - I agree with discontinuation of Eliquis and aspirin at this time.  I believe risks of continuing medication far outweigh the risks of discontinuing medication.  Continuing anticoagulation could lead to brain bleed, severe worsening of hematoma. - Thankfully, since discontinuation of Eliquis and aspirin, patient's hematoma has not significantly grown in size, though ecchymosis has spread - Discussed red flag symptoms that would warrant immediate ER evaluation including new severe headache, chest pain, unilateral body weakness  Patient accompanied by his daughter throughout examination  Date of injury was 09/03/2023. Original symptom severity scores were 19 and 62. The patient was counseled on the nature of the injury, typical course and potential options for further evaluation and treatment. Discussed the importance of compliance with recommendations. Patient stated understanding of this plan and willingness to comply.  Recommendations:  -  Relative mental and physical rest for 48 hours after concussive  event - Recommend light aerobic activity while keeping symptoms less than 3/10 - Stop mental or physical activities that cause symptoms to worsen greater than 3/10, and wait 24 hours before attempting them again - Eliminate screen time as much as possible for first 48 hours after concussive event, then continue limited screen time (recommend less than 2 hours per day)  Pertinent previous records reviewed include ER note 09/03/2023, internal medicine note 09/06/23  - Encouraged to RTC in 1 week for reassessment or sooner for any concerns or acute changes    Time of visit 47 minutes, which included chart review, physical exam, treatment plan, symptom severity score, VOMS, and tandem gait testing being performed, interpreted, and discussed with patient at today's visit.   Subjective:   I, Moenique Starling Manns, am serving as a Neurosurgeon for Doctor Richardean Sale  Chief Complaint: concussion and hematoma on back   HPI:   09/09/23 Patient is a 72 year old male with concussion and hematoma on back . Patient states was seen in ED fell down some steps of stairs at home. He denies any LOC. Patient states pain more localized to left lower back region and feels very stiff at this time    Concussion HPI:  - Injury date: 09/03/2023   - Mechanism of injury: fall   - LOC: no  - Initial evaluation: ED  - Previous head injuries/concussions: no   - Previous imaging: ED     - Social history: retired   Hospitalization for head injury? No Diagnosed/treated for headache disorder, migraines, or seizures? No Diagnosed with learning disability Elnita Maxwell? Yes dyslexia  Diagnosed with ADD/ADHD? No Diagnose with Depression, anxiety, or other Psychiatric Disorder? No   Current medications:  Current Outpatient Medications  Medication Sig Dispense Refill   acetaminophen (TYLENOL) 650 MG CR tablet  Take 650 mg by mouth every 8 (eight) hours as needed for pain.     aspirin 325 MG EC tablet Take 325 mg by mouth daily.      Biotin 16109 MCG TABS Take by mouth.     Calcium Carb-Magnesium Carb (MAGNEBIND 400) 80-115 MG TABS Take 1 tablet by mouth daily.     Cholecalciferol (VITAMIN D3) 1000 UNITS CAPS Take by mouth daily.     Cholecalciferol 25 MCG (1000 UT) tablet Take by mouth.     Coenzyme Q10 (COQ10) 50 MG CAPS Take by mouth daily.     Cyanocobalamin (VITAMIN B 12 PO) Take by mouth.     diltiazem (TIAZAC) 180 MG 24 hr capsule Take 1 capsule by mouth daily.     ELIQUIS 5 MG TABS tablet Take 5 mg by mouth 2 (two) times daily.     fenofibrate (TRICOR) 145 MG tablet TAKE 1 TABLET BY MOUTH DAILY 90 tablet 3   Flaxseed, Linseed, (FLAX SEED OIL) 1000 MG CAPS Take by mouth.     FLUoxetine (PROZAC) 40 MG capsule TAKE 1 CAPSULE BY MOUTH DAILY 90 capsule 3   Glucosamine Sulfate 1000 MG TABS Take by mouth. 2 by mouth daily     hydrochlorothiazide (HYDRODIURIL) 25 MG tablet TAKE 1/2 TABLET BY MOUTH DAILY 45 tablet 1   losartan (COZAAR) 25 MG tablet Take 25 mg by mouth daily.      Magnesium 400 MG TABS Take by mouth.     Melatonin 10 MG TABS Take by mouth.     metoprolol tartrate (LOPRESSOR) 25 MG tablet Take 12.5 mg by mouth 2 (two) times daily.      Multiple Vitamin (MULTIVITAMIN) tablet Take 1 tablet by mouth daily.     pantoprazole (PROTONIX) 40 MG tablet TAKE 1 TABLET BY MOUTH DAILY 30 MINUTES BEFORE BREAKFAST 90 tablet 1   pravastatin (PRAVACHOL) 20 MG tablet TAKE 1/2 TABLET BY MOUTH NIGHTLY AT BEDTIME 45 tablet 1   ranolazine (RANEXA) 500 MG 12 hr tablet Take 500 mg by mouth 2 (two) times daily.      tamsulosin (FLOMAX) 0.4 MG CAPS capsule TAKE 1 CAPSULE BY MOUTH DAILY 90 capsule 1   VITAMIN E PO Take by mouth.     Zinc 25 MG TABS Take by mouth.     No current facility-administered medications for this visit.      Objective:     Vitals:   09/09/23 0953  BP: 122/82  Pulse: 78  SpO2: 98%  Weight: 253 lb (114.8 kg)  Height: 5\' 11"  (1.803 m)      Body mass index is 35.29 kg/m.    Physical Exam:      General: Well-appearing, cooperative, sitting comfortably in no acute distress.  Psychiatric: Mood and affect are appropriate.   Neuro:sensation intact and strength 5/5 with no deficits, no atrophy, normal muscle tone   Today's Symptom Severity Score:  Scores: 0-6  Headache:2 "Pressure in head":3  Neck Pain:4 Nausea or vomiting:0 Dizziness:3 Blurred vision:3 Balance problems:5 Sensitivity to light:0 Sensitivity to noise:0 Feeling slowed down:3 Feeling like "in a fog":3 "Don't feel right":6 Difficulty concentrating:3 Difficulty remembering:4  Fatigue or low energy:5 Confusion:3  Drowsiness:5  More emotional:2 Irritability:2 Sadness:2  Nervous or Anxious:2 Trouble falling or staying asleep:2  Total number of symptoms: 19/22  Symptom Severity index: 62/132  Worse with physical activity? No Worse with mental activity? No Percent improved since injury: 40%    Full pain-free cervical PROM: yes  Cognitive:  - Months backwards: 5 Mistakes.  35 seconds  mVOMS:   - Baseline symptoms: Back pain - Horizontal Vestibular-Ocular Reflex: 0/10  - Smooth pursuits: 0/10  - Horizontal Saccades:  0/10  - Visual Motion Sensitivity Test: Dizzy:/10  - Convergence: 7,7cm (<5 cm normal)    Autonomic:  - Symptomatic with supine to standing: Yes, lightheaded  Complex Tandem Gait: -Not performed due to patient's physical back pain  Electronically signed by:  Aleen Sells D.[facility] 10:50 AM 09/09/23

## 2023-09-09 NOTE — Patient Instructions (Addendum)
Do not restart Asprin or eliquis   -  Relative mental and physical rest for 48 hours after concussive event - Recommend light aerobic activity while keeping symptoms less than 3/10 - Stop mental or physical activities that cause symptoms to worsen greater than 3/10, and wait 24 hours before attempting them again - Eliminate screen time as much as possible for first 48 hours after concussive event, then continue limited screen time (recommend less than 2 hours per day) Heating pads over areas of pain   Go to ER if worse headache of your life ,chest  pain, and 1 sided body weakness ,   1 week follow up

## 2023-09-12 ENCOUNTER — Ambulatory Visit: Payer: Medicare Other | Admitting: Gastroenterology

## 2023-09-13 NOTE — Progress Notes (Unsigned)
Aleen Sells D.[facility] [address] [place] [address] Phone: [phone]  Assessment and Plan:     There are no diagnoses linked to this encounter.  ***    Date of injury was 09/03/2023.  Symptom severity scores of *** and *** today.  Original symptom severity scores were 19 and 62.   Recommendations:  -  Goal of sleeping a minimum of 7-8 continuous hours nightly - Recommend light physical activity for 15-30 minutes a day while keeping symptoms less than 3/10 - Stop mental or physical activities that cause symptoms to worsen greater than 3/10, and wait 24 hours before attempting them again - Eliminate screen time as much as possible for first 48 hours after concussive event, then continue limited screen time (recommend less than 2 hours per day)  Pertinent previous records reviewed include ***    - Encouraged to RTC in *** for reassessment or sooner for any concerns or acute changes    Time of visit *** minutes, which included chart review, physical exam, treatment plan, symptom severity score, VOMS, and tandem gait testing being performed, interpreted, and discussed with patient at today's visit.   Subjective:   I, Loxley Cibrian Starling Manns, am serving as a Neurosurgeon for Doctor Richardean Sale   Chief Complaint: concussion and hematoma on back    HPI:    09/09/23 Patient is a 72 year old male with concussion and hematoma on back . Patient states was seen in ED fell down some steps of stairs at home. He denies any LOC. Patient states pain more localized to left lower back region and feels very stiff at this time   09/16/2023 Patient states   Concussion HPI:  - Injury date: 09/03/2023   - Mechanism of injury: fall   - LOC: no  - Initial evaluation: ED  - Previous head injuries/concussions: no   - Previous imaging: ED     - Social history: retired    Hospitalization for head injury? No Diagnosed/treated for headache disorder, migraines, or  seizures? No Diagnosed with learning disability Elnita Maxwell? Yes dyslexia  Diagnosed with ADD/ADHD? No Diagnose with Depression, anxiety, or other Psychiatric Disorder? No   Current medications:  Current Outpatient Medications  Medication Sig Dispense Refill   acetaminophen (TYLENOL) 650 MG CR tablet Take 650 mg by mouth every 8 (eight) hours as needed for pain.     aspirin 325 MG EC tablet Take 325 mg by mouth daily.     Biotin 21308 MCG TABS Take by mouth.     Calcium Carb-Magnesium Carb (MAGNEBIND 400) 80-115 MG TABS Take 1 tablet by mouth daily.     Cholecalciferol (VITAMIN D3) 1000 UNITS CAPS Take by mouth daily.     Cholecalciferol 25 MCG (1000 UT) tablet Take by mouth.     Coenzyme Q10 (COQ10) 50 MG CAPS Take by mouth daily.     Cyanocobalamin (VITAMIN B 12 PO) Take by mouth.     diltiazem (TIAZAC) 180 MG 24 hr capsule Take 1 capsule by mouth daily.     ELIQUIS 5 MG TABS tablet Take 5 mg by mouth 2 (two) times daily.     fenofibrate (TRICOR) 145 MG tablet TAKE 1 TABLET BY MOUTH DAILY 90 tablet 3   Flaxseed, Linseed, (FLAX SEED OIL) 1000 MG CAPS Take by mouth.     FLUoxetine (PROZAC) 40 MG capsule TAKE 1 CAPSULE BY MOUTH DAILY 90 capsule 3   Glucosamine Sulfate 1000 MG TABS Take by mouth. 2  by mouth daily     hydrochlorothiazide (HYDRODIURIL) 25 MG tablet TAKE 1/2 TABLET BY MOUTH DAILY 45 tablet 1   losartan (COZAAR) 25 MG tablet Take 25 mg by mouth daily.      Magnesium 400 MG TABS Take by mouth.     Melatonin 10 MG TABS Take by mouth.     metoprolol tartrate (LOPRESSOR) 25 MG tablet Take 12.5 mg by mouth 2 (two) times daily.      Multiple Vitamin (MULTIVITAMIN) tablet Take 1 tablet by mouth daily.     pantoprazole (PROTONIX) 40 MG tablet TAKE 1 TABLET BY MOUTH DAILY 30 MINUTES BEFORE BREAKFAST 90 tablet 1   pravastatin (PRAVACHOL) 20 MG tablet TAKE 1/2 TABLET BY MOUTH NIGHTLY AT BEDTIME 45 tablet 1   ranolazine (RANEXA) 500 MG 12 hr tablet Take 500 mg by mouth 2 (two) times daily.       tamsulosin (FLOMAX) 0.4 MG CAPS capsule TAKE 1 CAPSULE BY MOUTH DAILY 90 capsule 1   VITAMIN E PO Take by mouth.     Zinc 25 MG TABS Take by mouth.     No current facility-administered medications for this visit.      Objective:     There were no vitals filed for this visit.    There is no height or weight on file to calculate BMI.    Physical Exam:     General: Well-appearing, cooperative, sitting comfortably in no acute distress.  Psychiatric: Mood and affect are appropriate.   Neuro:sensation intact and strength 5/5 with no deficits, no atrophy, normal muscle tone   Today's Symptom Severity Score:  Scores: 0-6  Headache:*** "Pressure in head":***  Neck Pain:*** Nausea or vomiting:*** Dizziness:*** Blurred vision:*** Balance problems:*** Sensitivity to light:*** Sensitivity to noise:*** Feeling slowed down:*** Feeling like "in a fog":*** "Don't feel right":*** Difficulty concentrating:*** Difficulty remembering:***  Fatigue or low energy:*** Confusion:***  Drowsiness:***  More emotional:*** Irritability:*** Sadness:***  Nervous or Anxious:*** Trouble falling or staying asleep:***  Total number of symptoms: ***/22  Symptom Severity index: ***/132  Worse with physical activity? No*** Worse with mental activity? No*** Percent improved since injury: ***%    Full pain-free cervical PROM: yes***    Cognitive:  - Months backwards: *** Mistakes. *** seconds  mVOMS:   - Baseline symptoms: *** - Horizontal Vestibular-Ocular Reflex: ***/10  - Smooth pursuits: ***/10  - Horizontal Saccades:  ***/10  - Visual Motion Sensitivity Test:  ***/10  - Convergence: ***cm (<5 cm normal)    Autonomic:  - Symptomatic with supine to standing: No***  Complex Tandem Gait: - Forward, eyes open: *** errors - Backward, eyes open: *** errors - Forward, eyes closed: *** errors - Backward, eyes closed: *** errors  Electronically signed by:  Aleen Sells D.[facility] 7:31 AM 09/13/23

## 2023-09-16 ENCOUNTER — Ambulatory Visit: Payer: Medicare Other | Admitting: Sports Medicine

## 2023-09-16 VITALS — BP 132/80 | HR 78 | Ht 71.0 in | Wt 251.0 lb

## 2023-09-16 DIAGNOSIS — T148XXA Other injury of unspecified body region, initial encounter: Secondary | ICD-10-CM

## 2023-09-16 DIAGNOSIS — M545 Low back pain, unspecified: Secondary | ICD-10-CM

## 2023-09-16 DIAGNOSIS — S060X0D Concussion without loss of consciousness, subsequent encounter: Secondary | ICD-10-CM

## 2023-09-16 DIAGNOSIS — Z7901 Long term (current) use of anticoagulants: Secondary | ICD-10-CM | POA: Diagnosis not present

## 2023-09-16 NOTE — Patient Instructions (Signed)
Can restart driving , if no symptoms are present  In 1 week restart eliquis . And if swelling worsens stop medication  3 week follow up

## 2023-09-21 ENCOUNTER — Other Ambulatory Visit: Payer: Self-pay | Admitting: Internal Medicine

## 2023-10-08 NOTE — Progress Notes (Signed)
 Ben Lacrystal Barbe D.[facility] [address] [place] [address] Phone: [phone]  Assessment and Plan:     1. Hematoma (Primary) -Acute, subsequent visit - Patient was able to restart Eliquis without worsening of hematoma.  Continue Eliquis at previously prescribed dose - Patient was experiencing low back pain and discomfort because of the size of hematoma.  Requesting drainage at today's visit.  Tolerated well per note below  Procedure: Ultrasound Guided hematoma aspiration  Side: Midline Diagnosis: Low back hematoma US  Indication:  - accuracy is paramount for diagnosis - to ensure therapeutic efficacy or procedural success - to reduce procedural risk  After explaining the procedure, viable alternatives, risks, and answering any questions, consent was given verbally. The site was cleaned with chlorhexidine prep. An ultrasound transducer was placed on the low back.  The hematoma was identified, located superficial to vertebral bodies and surrounding musculature.  1 mm lidocaine  1% injected superficially.  A 18-gauge needle was introduced through a wheal of anesthesia into loculated hematoma.  140 mL of red/brown fluid was aspirated.  This was well tolerated and resulted in  relief.  Needle was removed and dressing placed and post injection instructions were given including  a discussion of likely return of pain today after the anesthetic wears off (with the possibility of worsened pain)  .   Pt was advised to call or return to clinic if these symptoms worsen or fail to improve as anticipated.   2. Concussion without loss of consciousness, subsequent encounter -Acute, improving, subsequent visit - Overall significant improvement in concussion symptoms.  I believe remaining symptoms are likely related to musculoskeletal soreness - Cleared to return to all physical and mental activities without restriction - Continue Tylenol  as needed for day-to-day pain relief     Date of injury was 09/03/2023.  Symptom severity scores of 7 and 18/ today.  Original symptom severity scores were 19 and 62.   Recommendations:  -  Goal of sleeping a minimum of 7-8 continuous hours nightly - Recommend light physical activity for 15-30 minutes a day while keeping symptoms less than 3/10 - Stop mental or physical activities that cause symptoms to worsen greater than 3/10, and wait 24 hours before attempting them again - Eliminate screen time as much as possible for first 48 hours after concussive event, then continue limited screen time (recommend less than 2 hours per day)  Pertinent previous records reviewed include none  - Encouraged to RTC as needed   Time of visit 38 minutes, which included chart review, physical exam, treatment plan, symptom severity score, VOMS, and tandem gait testing being performed, interpreted, and discussed with patient at today's visit.  This does not include time dedicated to procedure.   Subjective:   I, Richard Trevino, am serving as a neurosurgeon for Doctor Morene Mace   Chief Complaint: concussion and hematoma on back    HPI:    09/09/23 Patient is a 73 year old male with concussion and hematoma on back . Patient states was seen in ED fell down some steps of stairs at home. He denies any LOC. Patient states pain more localized to left lower back region and feels very stiff at this time    09/16/2023 Patient states he is feeling better, swelling in his back has come down but a big knot now   10/09/2023 Patient states he is good. He is still having back pain with that knot . Along with a spot on his head  Concussion HPI:  - Injury date: 09/03/2023   - Mechanism of injury: fall   - LOC: no  - Initial evaluation: ED  - Previous head injuries/concussions: no   - Previous imaging: ED     - Social history: retired    Hospitalization for head injury? No Diagnosed/treated for headache disorder, migraines, or seizures? No Diagnosed  with learning disability karlyn? Yes dyslexia  Diagnosed with ADD/ADHD? No Diagnose with Depression, anxiety, or other Psychiatric Disorder? No   Current medications:  Current Outpatient Medications  Medication Sig Dispense Refill   acetaminophen  (TYLENOL ) 650 MG CR tablet Take 650 mg by mouth every 8 (eight) hours as needed for pain.     aspirin 325 MG EC tablet Take 325 mg by mouth daily.     Biotin 89999 MCG TABS Take by mouth.     Calcium  Carb-Magnesium  Carb (MAGNEBIND 400) 80-115 MG TABS Take 1 tablet by mouth daily.     Cholecalciferol (VITAMIN D3) 1000 UNITS CAPS Take by mouth daily.     Cholecalciferol 25 MCG (1000 UT) tablet Take by mouth.     Coenzyme Q10 (COQ10) 50 MG CAPS Take by mouth daily.     Cyanocobalamin (VITAMIN B 12 PO) Take by mouth.     diltiazem (TIAZAC) 180 MG 24 hr capsule Take 1 capsule by mouth daily.     ELIQUIS 5 MG TABS tablet Take 5 mg by mouth 2 (two) times daily.     fenofibrate  (TRICOR ) 145 MG tablet TAKE 1 TABLET BY MOUTH DAILY 90 tablet 3   Flaxseed, Linseed, (FLAX SEED OIL) 1000 MG CAPS Take by mouth.     FLUoxetine  (PROZAC ) 40 MG capsule TAKE 1 CAPSULE BY MOUTH DAILY 90 capsule 3   Glucosamine Sulfate 1000 MG TABS Take by mouth. 2 by mouth daily     hydrochlorothiazide  (HYDRODIURIL ) 25 MG tablet TAKE 1/2 TABLET BY MOUTH DAILY 45 tablet 1   losartan  (COZAAR ) 25 MG tablet Take 25 mg by mouth daily.      Magnesium  400 MG TABS Take by mouth.     Melatonin 10 MG TABS Take by mouth.     metoprolol tartrate (LOPRESSOR) 25 MG tablet Take 12.5 mg by mouth 2 (two) times daily.      Multiple Vitamin (MULTIVITAMIN) tablet Take 1 tablet by mouth daily.     pantoprazole  (PROTONIX ) 40 MG tablet TAKE 1 TABLET BY MOUTH DAILY 30 MINUTES BEFORE BREAKFAST 90 tablet 1   pravastatin  (PRAVACHOL ) 20 MG tablet TAKE 1/2 TABLET BY MOUTH NIGHTLY AT BEDTIME 45 tablet 1   ranolazine (RANEXA) 500 MG 12 hr tablet Take 500 mg by mouth 2 (two) times daily.      tamsulosin   (FLOMAX ) 0.4 MG CAPS capsule TAKE 1 CAPSULE BY MOUTH DAILY 90 capsule 1   VITAMIN E PO Take by mouth.     Zinc 25 MG TABS Take by mouth.     No current facility-administered medications for this visit.      Objective:     Vitals:   10/09/23 1349  Pulse: 70  SpO2: 95%  Weight: 253 lb (114.8 kg)  Height: 5' 11 (1.803 m)      Body mass index is 35.29 kg/m.    Physical Exam:     General: Well-appearing, cooperative, sitting comfortably in no acute distress.  Psychiatric: Mood and affect are appropriate.   Neuro:sensation intact and strength 5/5 with no deficits, no atrophy, normal muscle tone   Today's Symptom Severity Score:  Scores:  0-6  Headache:0 Pressure in head:0  Neck Pain:6 Nausea or vomiting:0 Dizziness:0 Blurred vision:0 Balance problems:1 Sensitivity to light:0 Sensitivity to noise:0 Feeling slowed down:2 Feeling like "in a fog":0 "Don't feel right":1 Difficulty concentrating:2 Difficulty remembering:3  Fatigue or low energy:3 Confusion:0  Drowsiness:0  More emotional:0 Irritability:0 Sadness:0  Nervous or Anxious:0 Trouble falling or staying asleep:0  Total number of symptoms: 7/22  Symptom Severity index: 18/132  Worse with physical activity? No Worse with mental activity? No Percent improved since injury: 70%    Full pain-free cervical PROM: yes     Low back: Large, mobile, mildly tender mass along low back.  Resolving ecchymosis.  No open lesion, no erythema, no warmth.   Electronically signed by:  Odis Mace D.[facility] 2:48 PM 10/09/23

## 2023-10-09 ENCOUNTER — Other Ambulatory Visit: Payer: Self-pay

## 2023-10-09 ENCOUNTER — Ambulatory Visit: Payer: Medicare Other | Admitting: Sports Medicine

## 2023-10-09 VITALS — HR 70 | Ht 71.0 in | Wt 253.0 lb

## 2023-10-09 DIAGNOSIS — S060X0D Concussion without loss of consciousness, subsequent encounter: Secondary | ICD-10-CM | POA: Diagnosis not present

## 2023-10-09 DIAGNOSIS — T148XXA Other injury of unspecified body region, initial encounter: Secondary | ICD-10-CM

## 2023-10-09 NOTE — Patient Instructions (Signed)
 Cleared from concussion standpoint  As needed follow up

## 2023-11-11 DIAGNOSIS — I4892 Unspecified atrial flutter: Secondary | ICD-10-CM | POA: Diagnosis not present

## 2023-11-13 DIAGNOSIS — K921 Melena: Secondary | ICD-10-CM | POA: Diagnosis not present

## 2023-11-25 ENCOUNTER — Other Ambulatory Visit: Payer: Self-pay | Admitting: Internal Medicine

## 2023-11-26 DIAGNOSIS — I4892 Unspecified atrial flutter: Secondary | ICD-10-CM | POA: Diagnosis not present

## 2023-11-28 DIAGNOSIS — I4892 Unspecified atrial flutter: Secondary | ICD-10-CM | POA: Diagnosis not present

## 2023-12-17 ENCOUNTER — Encounter: Payer: Self-pay | Admitting: Internal Medicine

## 2023-12-17 NOTE — Progress Notes (Unsigned)
 Subjective:    Patient ID: Richard Trevino, male    DOB: May 13, 1951, 73 y.o.   MRN: [id number]     HPI Tennis is here for follow up of his chronic medical problems.  Last fall he fell - still has posterior head tenderness.   Posterior lower back tender and throbs at tiems.    Since fall - he can not haedly sleep all  night. Used to sleep 10 hrs - now goes tobe at 9 and up at 2-3am.  Has to nap every day bc he is sto tired.  His nap is an hour and a half.     ?  Repeat colonoscopy-did have many polyps removed some of which were precancerous - has seen [facility].  They wanted to get clearance from cardiology regarding his cardiac status  Medications and allergies reviewed with patient and updated if appropriate.  Current Outpatient Medications on File Prior to Visit  Medication Sig Dispense Refill   acetaminophen (TYLENOL) 650 MG CR tablet Take 650 mg by mouth every 8 (eight) hours as needed for pain.     aspirin 325 MG EC tablet Take 325 mg by mouth daily.     Biotin 40981 MCG TABS Take by mouth.     Calcium Carb-Magnesium Carb (MAGNEBIND 400) 80-115 MG TABS Take 1 tablet by mouth daily.     Cholecalciferol (VITAMIN D3) 1000 UNITS CAPS Take by mouth daily.     Cholecalciferol 25 MCG (1000 UT) tablet Take by mouth.     Coenzyme Q10 (COQ10) 50 MG CAPS Take by mouth daily.     Cyanocobalamin (VITAMIN B 12 PO) Take by mouth.     diltiazem (TIAZAC) 180 MG 24 hr capsule Take 1 capsule by mouth daily.     ELIQUIS 5 MG TABS tablet Take 5 mg by mouth 2 (two) times daily.     fenofibrate (TRICOR) 145 MG tablet TAKE 1 TABLET BY MOUTH DAILY 90 tablet 3   Flaxseed, Linseed, (FLAX SEED OIL) 1000 MG CAPS Take by mouth.     FLUoxetine (PROZAC) 40 MG capsule TAKE 1 CAPSULE BY MOUTH DAILY 90 capsule 3   Glucosamine Sulfate 1000 MG TABS Take by mouth. 2 by mouth daily     hydrochlorothiazide (HYDRODIURIL) 25 MG tablet TAKE 1/2 TABLET BY MOUTH DAILY 45 tablet 1   losartan (COZAAR) 25 MG  tablet Take 25 mg by mouth daily.      Magnesium 400 MG TABS Take by mouth.     Melatonin 10 MG TABS Take by mouth.     metoprolol tartrate (LOPRESSOR) 25 MG tablet Take 12.5 mg by mouth 2 (two) times daily.      Multiple Vitamin (MULTIVITAMIN) tablet Take 1 tablet by mouth daily.     pantoprazole (PROTONIX) 40 MG tablet TAKE 1 TABLET BY MOUTH 30 MINUTES BEFORE BREAKFAST 90 tablet 1   ranolazine (RANEXA) 500 MG 12 hr tablet Take 500 mg by mouth 2 (two) times daily.      tamsulosin (FLOMAX) 0.4 MG CAPS capsule TAKE 1 CAPSULE BY MOUTH DAILY 90 capsule 1   VITAMIN E PO Take by mouth.     Zinc 25 MG TABS Take by mouth.     No current facility-administered medications on file prior to visit.     Review of Systems  Constitutional:  Negative for fever.  Respiratory:  Positive for cough (residual from cold two months ago). Negative for shortness of breath and wheezing.  Cardiovascular:  Negative for chest pain, palpitations and leg swelling.  Neurological:  Negative for light-headedness and headaches.       Objective:   Vitals:   12/18/23 1411  BP: 132/80  Pulse: 74  Temp: 98 F (36.7 C)  SpO2: 93%   BP Readings from Last 3 Encounters:  12/18/23 132/80  09/16/23 132/80  09/09/23 122/82   Wt Readings from Last 3 Encounters:  12/18/23 253 lb (114.8 kg)  10/09/23 253 lb (114.8 kg)  09/16/23 251 lb (113.9 kg)   Body mass index is 35.29 kg/m.    Physical Exam Constitutional:      General: He is not in acute distress.    Appearance: Normal appearance. He is not ill-appearing.  HENT:     Head: Normocephalic and atraumatic.     Comments: Focal area of tenderness where he hit his head last fall-slight skin discoloration, mildly tender, no fluctuance, induration or obvious foreign body under the skin Eyes:     Conjunctiva/sclera: Conjunctivae normal.  Cardiovascular:     Rate and Rhythm: Normal rate and regular rhythm.     Heart sounds: Normal heart sounds.  Pulmonary:      Effort: Pulmonary effort is normal. No respiratory distress.     Breath sounds: Normal breath sounds. No wheezing or rales.  Musculoskeletal:     Right lower leg: No edema.     Left lower leg: No edema.  Skin:    General: Skin is warm and dry.     Findings: Bruising (lower back from injury last fall-slight tenderness) present. No rash.  Neurological:     Mental Status: He is alert. Mental status is at baseline.  Psychiatric:        Mood and Affect: Mood normal.        Lab Results  Component Value Date   WBC 5.5 06/19/2023   HGB 15.7 06/19/2023   HCT 46.2 06/19/2023   PLT 186.0 06/19/2023   GLUCOSE 106 (H) 06/19/2023   CHOL 190 06/19/2023   TRIG (H) 06/19/2023    621.0 Triglyceride is over 400; calculations on Lipids are invalid.   HDL 27.60 (L) 06/19/2023   LDLDIRECT 130.0 06/19/2023   LDLCALC 93 05/18/2020   ALT 32 06/19/2023   AST 45 (H) 06/19/2023   NA 138 06/19/2023   K 4.1 06/19/2023   CL 103 06/19/2023   CREATININE 1.40 06/19/2023   BUN 18 06/19/2023   CO2 26 06/19/2023   TSH 3.72 06/19/2023   PSA 0.67 06/19/2023   HGBA1C 5.9 06/19/2023   MICROALBUR 0.3 04/21/2007     Assessment & Plan:    See Problem List for Assessment and Plan of chronic medical problems.

## 2023-12-18 ENCOUNTER — Ambulatory Visit: Payer: Medicare Other | Admitting: Internal Medicine

## 2023-12-18 ENCOUNTER — Other Ambulatory Visit: Payer: Self-pay | Admitting: Internal Medicine

## 2023-12-18 VITALS — BP 132/80 | HR 74 | Temp 98.0°F | Ht 71.0 in | Wt 253.0 lb

## 2023-12-18 DIAGNOSIS — N1831 Chronic kidney disease, stage 3a: Secondary | ICD-10-CM

## 2023-12-18 DIAGNOSIS — I10 Essential (primary) hypertension: Secondary | ICD-10-CM | POA: Diagnosis not present

## 2023-12-18 DIAGNOSIS — I4892 Unspecified atrial flutter: Secondary | ICD-10-CM

## 2023-12-18 DIAGNOSIS — K219 Gastro-esophageal reflux disease without esophagitis: Secondary | ICD-10-CM

## 2023-12-18 DIAGNOSIS — F3289 Other specified depressive episodes: Secondary | ICD-10-CM

## 2023-12-18 DIAGNOSIS — R7303 Prediabetes: Secondary | ICD-10-CM

## 2023-12-18 DIAGNOSIS — N401 Enlarged prostate with lower urinary tract symptoms: Secondary | ICD-10-CM

## 2023-12-18 DIAGNOSIS — E785 Hyperlipidemia, unspecified: Secondary | ICD-10-CM

## 2023-12-18 NOTE — Assessment & Plan Note (Signed)
 Chronic GFR has been stable CBC, CMP

## 2023-12-18 NOTE — Assessment & Plan Note (Signed)
 Chronic Following with cardiology Had radiofrequency ablation 06/2020 Has had episodes after ablation Continue Eliquis 5 mg twice daily, diltiazem 180 mg daily, metoprolol 12.5 mg twice daily CBC, CMP

## 2023-12-18 NOTE — Assessment & Plan Note (Signed)
 Chronic Controlled, Stable Continue fluoxetine 40 mg daily

## 2023-12-18 NOTE — Assessment & Plan Note (Signed)
 Chronic Has some difficulty with urination/emptying bladder - and increased nocturia Stopped seeing urology Deferred increasing tamsulosin to 2 tabs daily at last visit Continue tamsulosin 0.4 mg daily

## 2023-12-18 NOTE — Assessment & Plan Note (Signed)
Chronic Regular exercise and healthy diet encouraged Check lipid panel, cmp Continue pravastatin 20 mg daily

## 2023-12-18 NOTE — Patient Instructions (Addendum)
      Blood work was ordered.       Medications changes include :   None      Return in about 6 months (around 06/19/2024) for Physical Exam.

## 2023-12-18 NOTE — Assessment & Plan Note (Signed)
 Chronic Lab Results  Component Value Date   HGBA1C 5.9 06/19/2023   Check a1c Low sugar / carb diet Stressed regular exercise

## 2023-12-18 NOTE — Assessment & Plan Note (Signed)
Chronic Blood pressure well controlled CMP Continue diltiazem 180 mg daily, HCTZ 25 mg daily, losartan 25 mg daily, metoprolol 12.5 mg twice daily

## 2023-12-18 NOTE — Assessment & Plan Note (Signed)
 Chronic GERD controlled Continue pantoprazole 40 mg daily

## 2023-12-20 DIAGNOSIS — I480 Paroxysmal atrial fibrillation: Secondary | ICD-10-CM | POA: Diagnosis not present

## 2023-12-25 ENCOUNTER — Other Ambulatory Visit (INDEPENDENT_AMBULATORY_CARE_PROVIDER_SITE_OTHER)

## 2023-12-25 DIAGNOSIS — I10 Essential (primary) hypertension: Secondary | ICD-10-CM

## 2023-12-25 DIAGNOSIS — E785 Hyperlipidemia, unspecified: Secondary | ICD-10-CM | POA: Diagnosis not present

## 2023-12-25 DIAGNOSIS — N1831 Chronic kidney disease, stage 3a: Secondary | ICD-10-CM

## 2023-12-25 DIAGNOSIS — R7303 Prediabetes: Secondary | ICD-10-CM | POA: Diagnosis not present

## 2023-12-25 LAB — CBC WITH DIFFERENTIAL/PLATELET
Basophils Absolute: 0 10*3/uL (ref 0.0–0.1)
Basophils Relative: 0.7 % (ref 0.0–3.0)
Eosinophils Absolute: 0.1 10*3/uL (ref 0.0–0.7)
Eosinophils Relative: 2.6 % (ref 0.0–5.0)
HCT: 45.2 % (ref 39.0–52.0)
Hemoglobin: 15.4 g/dL (ref 13.0–17.0)
Lymphocytes Relative: 20.4 % (ref 12.0–46.0)
Lymphs Abs: 1.1 10*3/uL (ref 0.7–4.0)
MCHC: 34 g/dL (ref 30.0–36.0)
MCV: 94 fl (ref 78.0–100.0)
Monocytes Absolute: 0.4 10*3/uL (ref 0.1–1.0)
Monocytes Relative: 7.9 % (ref 3.0–12.0)
Neutro Abs: 3.6 10*3/uL (ref 1.4–7.7)
Neutrophils Relative %: 68.4 % (ref 43.0–77.0)
Platelets: 190 10*3/uL (ref 150.0–400.0)
RBC: 4.81 Mil/uL (ref 4.22–5.81)
RDW: 13.3 % (ref 11.5–15.5)
WBC: 5.3 10*3/uL (ref 4.0–10.5)

## 2023-12-25 LAB — LIPID PANEL
Cholesterol: 190 mg/dL (ref 0–200)
HDL: 23 mg/dL — ABNORMAL LOW (ref >39.00)
NonHDL: 166.67
Total CHOL/HDL Ratio: 8
Triglycerides: 454 mg/dL — ABNORMAL HIGH (ref 0.0–149.0)
VLDL: 90.8 mg/dL — ABNORMAL HIGH (ref 0.0–40.0)

## 2023-12-25 LAB — LDL CHOLESTEROL, DIRECT: Direct LDL: 129 mg/dL

## 2023-12-25 LAB — COMPREHENSIVE METABOLIC PANEL
ALT: 26 U/L (ref 0–53)
AST: 38 U/L — ABNORMAL HIGH (ref 0–37)
Albumin: 4.4 g/dL (ref 3.5–5.2)
Alkaline Phosphatase: 81 U/L (ref 39–117)
BUN: 22 mg/dL (ref 6–23)
CO2: 28 meq/L (ref 19–32)
Calcium: 9.8 mg/dL (ref 8.4–10.5)
Chloride: 105 meq/L (ref 96–112)
Creatinine, Ser: 1.56 mg/dL — ABNORMAL HIGH (ref 0.40–1.50)
GFR: 44.4 mL/min — ABNORMAL LOW (ref >60.00)
Glucose, Bld: 132 mg/dL — ABNORMAL HIGH (ref 70–99)
Potassium: 4.6 meq/L (ref 3.5–5.1)
Sodium: 139 meq/L (ref 135–145)
Total Bilirubin: 0.8 mg/dL (ref 0.2–1.2)
Total Protein: 7 g/dL (ref 6.0–8.3)

## 2023-12-25 LAB — HEMOGLOBIN A1C: Hgb A1c MFr Bld: 6.5 % (ref 4.6–6.5)

## 2023-12-30 ENCOUNTER — Telehealth: Payer: Self-pay | Admitting: Internal Medicine

## 2023-12-30 DIAGNOSIS — E785 Hyperlipidemia, unspecified: Secondary | ICD-10-CM

## 2023-12-30 NOTE — Telephone Encounter (Signed)
 Reason for CRM: Patient returning Carla'a phone call regarding lab results, I did relay Dr. Lawerance Bach message. He agreed to replace the pravastatin with crestor and requested a copy of his labs to be mailed to him. Please call him when mailed.   ---  Labs have been mailed

## 2023-12-31 MED ORDER — ROSUVASTATIN CALCIUM 20 MG PO TABS: 20.0000 mg | ORAL_TABLET | Freq: Every day | ORAL | 3 refills | Status: AC

## 2023-12-31 NOTE — Telephone Encounter (Signed)
 Crestor 20 mg sent to pharmacy.  This will replace the pravastatin.  He can also stop the fenofibrate which does not seem to be doing anything.  Blood work ordered.  Needs repeat blood work in about 6 weeks.

## 2023-12-31 NOTE — Telephone Encounter (Signed)
 Message left today for patient with info details.

## 2024-01-07 NOTE — Telephone Encounter (Signed)
 Copied from CRM [id number]. Topic: Appointments - Scheduling Inquiry for Clinic >> Jan 06, 2024  4:39 PM DeAngela L wrote: Reason for CRM: Patient call to schedule a Lab appointment in 6 weeks and also have a question about the medication he is taking and also a question about stopping the medication before the lab appointment which one's to stop taking call back  number for patient is [phone] please call after lunch  ---  Please advise on medication concerns

## 2024-01-07 NOTE — Telephone Encounter (Signed)
 Appointment made

## 2024-01-07 NOTE — Telephone Encounter (Signed)
 Questions answered as well.

## 2024-02-06 DIAGNOSIS — Z1211 Encounter for screening for malignant neoplasm of colon: Secondary | ICD-10-CM | POA: Diagnosis not present

## 2024-02-06 DIAGNOSIS — K648 Other hemorrhoids: Secondary | ICD-10-CM | POA: Diagnosis not present

## 2024-02-17 DIAGNOSIS — D235 Other benign neoplasm of skin of trunk: Secondary | ICD-10-CM | POA: Diagnosis not present

## 2024-02-17 DIAGNOSIS — Z08 Encounter for follow-up examination after completed treatment for malignant neoplasm: Secondary | ICD-10-CM | POA: Diagnosis not present

## 2024-02-17 DIAGNOSIS — L820 Inflamed seborrheic keratosis: Secondary | ICD-10-CM | POA: Diagnosis not present

## 2024-02-17 DIAGNOSIS — L570 Actinic keratosis: Secondary | ICD-10-CM | POA: Diagnosis not present

## 2024-02-17 DIAGNOSIS — Z85828 Personal history of other malignant neoplasm of skin: Secondary | ICD-10-CM | POA: Diagnosis not present

## 2024-02-17 DIAGNOSIS — L814 Other melanin hyperpigmentation: Secondary | ICD-10-CM | POA: Diagnosis not present

## 2024-02-19 ENCOUNTER — Other Ambulatory Visit (INDEPENDENT_AMBULATORY_CARE_PROVIDER_SITE_OTHER)

## 2024-02-19 DIAGNOSIS — E785 Hyperlipidemia, unspecified: Secondary | ICD-10-CM

## 2024-02-19 LAB — HEPATIC FUNCTION PANEL
ALT: 24 U/L (ref 0–53)
AST: 39 U/L — ABNORMAL HIGH (ref 0–37)
Albumin: 4.4 g/dL (ref 3.5–5.2)
Alkaline Phosphatase: 72 U/L (ref 39–117)
Bilirubin, Direct: 0.2 mg/dL (ref 0.0–0.3)
Total Bilirubin: 0.8 mg/dL (ref 0.2–1.2)
Total Protein: 6.9 g/dL (ref 6.0–8.3)

## 2024-02-19 LAB — LIPID PANEL
Cholesterol: 154 mg/dL (ref 0–200)
HDL: 22.3 mg/dL — ABNORMAL LOW (ref >39.00)
LDL Cholesterol: 52 mg/dL (ref 0–99)
NonHDL: 131.64
Total CHOL/HDL Ratio: 7
Triglycerides: 400 mg/dL — ABNORMAL HIGH (ref 0.0–149.0)
VLDL: 80 mg/dL — ABNORMAL HIGH (ref 0.0–40.0)

## 2024-02-20 ENCOUNTER — Ambulatory Visit: Payer: Self-pay | Admitting: Internal Medicine

## 2024-03-05 NOTE — Telephone Encounter (Signed)
 Copied from CRM [id number]. Topic: Clinical - Lab/Test Results >> Mar 04, 2024  5:02 PM Abigail D wrote: Reason for CRM: Patient's wife calling because she would like his recent lab results mailed to their home address on file.  ---  Labs have been mailed  TDW

## 2024-03-14 ENCOUNTER — Other Ambulatory Visit: Payer: Self-pay | Admitting: Internal Medicine

## 2024-04-27 ENCOUNTER — Ambulatory Visit (INDEPENDENT_AMBULATORY_CARE_PROVIDER_SITE_OTHER): Payer: Medicare Other

## 2024-04-27 VITALS — BP 138/80 | HR 71 | Ht 71.0 in | Wt 250.0 lb

## 2024-04-27 DIAGNOSIS — Z0000 Encounter for general adult medical examination without abnormal findings: Secondary | ICD-10-CM | POA: Diagnosis not present

## 2024-04-27 DIAGNOSIS — Z0100 Encounter for examination of eyes and vision without abnormal findings: Secondary | ICD-10-CM

## 2024-04-27 NOTE — Patient Instructions (Signed)
 Richard Trevino , Thank you for taking time out of your busy schedule to complete your Annual Wellness Visit with me. I enjoyed our conversation and look forward to speaking with you again next year. I, as well as your care team,  appreciate your ongoing commitment to your health goals. Please review the following plan we discussed and let me know if I can assist you in the future. Your Game plan/ To Do List    Referrals: If you haven't heard from the office you've been referred to, please reach out to them at the phone provided.  Referral Dr Geroge for routine eye exam Follow up Visits: Next Medicare AWV with our clinical staff: 05/04/2025   Have you seen your provider in the last 6 months (3 months if uncontrolled diabetes)? No Next Office Visit with your provider: 06/19/2024  Clinician Recommendations:  Aim for 30 minutes of exercise or brisk walking, 6-8 glasses of water, and 5 servings of fruits and vegetables each day. Educated and advised on getting the Pneumonia and Shingles vaccines in 2025.      This is a list of the screening recommended for you and due dates:  Health Maintenance  Topic Date Due   Colon Cancer Screening  09/02/2022   Pneumococcal Vaccine for age over 42 (1 of 2 - PCV) 06/18/2024*   Zoster (Shingles) Vaccine (1 of 2) 08/26/2054*   Flu Shot  05/01/2024   Medicare Annual Wellness Visit  04/27/2025   DTaP/Tdap/Td vaccine (3 - Td or Tdap) 06/19/2025   Hepatitis C Screening  Completed   Hepatitis B Vaccine  Aged Out   HPV Vaccine  Aged Out   Meningitis B Vaccine  Aged Out   COVID-19 Vaccine  Discontinued  *Topic was postponed. The date shown is not the original due date.    Advanced directives: (Provided) Advance directive discussed with you today. I have provided a copy for you to complete at home and have notarized. Once this is complete, please bring a copy in to our office so we can scan it into your chart.  Advance Care Planning is important because it:  [x]  Makes  sure you receive the medical care that is consistent with your values, goals, and preferences  [x]  It provides guidance to your family and loved ones and reduces their decisional burden about whether or not they are making the right decisions based on your wishes.  Follow the link provided in your after visit summary or read over the paperwork we have mailed to you to help you started getting your Advance Directives in place. If you need assistance in completing these, please reach out to us  so that we can help you!

## 2024-04-27 NOTE — Progress Notes (Addendum)
 Subjective:   Richard Trevino is a 73 y.o. who presents for a Medicare Wellness preventive visit.  As a reminder, Annual Wellness Visits don't include a physical exam, and some assessments may be limited, especially if this visit is performed virtually. We may recommend an in-person follow-up visit with your provider if needed.  Visit Complete: Virtual I connected with  Richard Trevino on 04/27/24 by a audio enabled telemedicine application and verified that I am speaking with the correct person using two identifiers.  Patient Location: Home  Provider Location: Office/Clinic  I discussed the limitations of evaluation and management by telemedicine. The patient expressed understanding and agreed to proceed.  Vital Signs: Because this visit was a virtual/telehealth visit, some criteria may be missing or patient reported. Any vitals not documented were not able to be obtained and vitals that have been documented are patient reported.  Persons Participating in Visit: Patient.  AWV Questionnaire: No: Patient Medicare AWV questionnaire was not completed prior to this visit.  Cardiac Risk Factors include: advanced age (>69men, >40 women)     Objective:    Today's Vitals   04/27/24 1145  BP: 138/80  Pulse: 71  SpO2: 97%  Weight: 250 lb (113.4 kg)  Height: 5' 11 (1.803 m)   Body mass index is 34.87 kg/m.     04/27/2024   11:46 AM 04/24/2023    1:04 PM 04/20/2022    2:36 PM  Advanced Directives  Does Patient Have a Medical Advance Directive? No No No  Would patient like information on creating a medical advance directive? Yes (MAU/Ambulatory/Procedural Areas - Information given) No - Patient declined No - Patient declined    Current Medications (verified) Outpatient Encounter Medications as of 04/27/2024  Medication Sig   acetaminophen  (TYLENOL ) 650 MG CR tablet Take 650 mg by mouth every 8 (eight) hours as needed for pain.   aspirin 325 MG EC tablet Take 325 mg by mouth  daily.   Biotin 89999 MCG TABS Take by mouth.   Calcium  Carb-Magnesium  Carb (MAGNEBIND 400) 80-115 MG TABS Take 1 tablet by mouth daily.   Cholecalciferol (VITAMIN D3) 1000 UNITS CAPS Take by mouth daily.   Cholecalciferol 25 MCG (1000 UT) tablet Take by mouth.   Coenzyme Q10 (COQ10) 50 MG CAPS Take by mouth daily.   Cyanocobalamin (VITAMIN B 12 PO) Take by mouth.   diltiazem (TIAZAC) 180 MG 24 hr capsule Take 1 capsule by mouth daily.   Flaxseed, Linseed, (FLAX SEED OIL) 1000 MG CAPS Take by mouth.   FLUoxetine  (PROZAC ) 40 MG capsule TAKE 1 CAPSULE BY MOUTH DAILY   Glucosamine Sulfate 1000 MG TABS Take by mouth. 2 by mouth daily   hydrochlorothiazide  (HYDRODIURIL ) 25 MG tablet TAKE 1/2 TABLET BY MOUTH DAILY   losartan  (COZAAR ) 25 MG tablet Take 25 mg by mouth daily.    Magnesium  400 MG TABS Take by mouth.   Melatonin 10 MG TABS Take by mouth.   metoprolol tartrate (LOPRESSOR) 25 MG tablet Take 12.5 mg by mouth 2 (two) times daily.    Multiple Vitamin (MULTIVITAMIN) tablet Take 1 tablet by mouth daily.   pantoprazole  (PROTONIX ) 40 MG tablet TAKE 1 TABLET BY MOUTH 30 MINUTES BEFORE BREAKFAST   ranolazine (RANEXA) 500 MG 12 hr tablet Take 500 mg by mouth 2 (two) times daily.    rosuvastatin  (CRESTOR ) 20 MG tablet Take 1 tablet (20 mg total) by mouth daily.   tamsulosin  (FLOMAX ) 0.4 MG CAPS capsule TAKE 1 CAPSULE BY MOUTH  DAILY   VITAMIN E PO Take by mouth.   Zinc 25 MG TABS Take by mouth.   ELIQUIS 5 MG TABS tablet Take 5 mg by mouth 2 (two) times daily. (Patient not taking: Reported on 04/27/2024)   No facility-administered encounter medications on file as of 04/27/2024.    Allergies (verified) Bee venom, Latex, and Niacin   History: Past Medical History:  Diagnosis Date   Fasting hyperglycemia    GERD (gastroesophageal reflux disease)    Gilbert's syndrome    Internal hemorrhoids    Multiple fracture 8021-8010   Coccyx, finger,hand   Rectal bleeding    Renal calculi    X 2    Skin cancer    ? Squamous cell; Dr Gelene   Past Surgical History:  Procedure Laterality Date   CHOLECYSTECTOMY  2003   Dr. Murvin    COLONOSCOPY  [id number]   negative   LITHOTRIPSY  2007   Dr Donelle, [place]   POLYPECTOMY     UPPER GASTROINTESTINAL ENDOSCOPY  2003   Dr Aneita   Family History  Problem Relation Age of Onset   Emphysema Father    Hypertension Father    Stroke Father         < 40   Hypertension Mother    Arthritis Mother    Multiple myeloma Mother    Skin cancer Brother    Prostate cancer Brother    Stroke Brother         > 55   Depression Brother    Cirrhosis Brother    Heart attack Maternal Uncle        X2; both > 55   Prostate cancer Maternal Uncle    Diabetes Neg Hx    Colon cancer Neg Hx    Colon polyps Neg Hx    Esophageal cancer Neg Hx    Stomach cancer Neg Hx    Rectal cancer Neg Hx    Social History   Socioeconomic History   Marital status: Married    Spouse name: Heron   Number of children: 3   Years of education: Not on file   Highest education level: Not on file  Occupational History   Occupation: truck Air traffic controller: [employer]  Tobacco Use   Smoking status: Former    Current packs/day: 0.00    Types: Cigarettes    Quit date: 10/02/1967    Years since quitting: 56.6   Smokeless tobacco: Never   Tobacco comments:    smoked 1968-69, up to 1/2 ppd  Vaping Use   Vaping status: Never Used  Substance and Sexual Activity   Alcohol use: No   Drug use: No   Sexual activity: Not Currently  Other Topics Concern   Not on file  Social History Narrative   Married   Social Drivers of Health   Financial Resource Strain: Low Risk  (04/27/2024)   Overall Financial Resource Strain (CARDIA)    Difficulty of Paying Living Expenses: Not hard at all  Food Insecurity: No Food Insecurity (04/27/2024)   Hunger Vital Sign    Worried About Running Out of Food in the Last Year: Never true    Ran Out of Food in the Last Year:  Never true  Transportation Needs: No Transportation Needs (04/27/2024)   PRAPARE - Administrator, Civil Service (Medical): No    Lack of Transportation (Non-Medical): No  Physical Activity: Sufficiently Active (04/27/2024)   Exercise Vital Sign  Days of Exercise per Week: 3 days    Minutes of Exercise per Session: 60 min  Stress: No Stress Concern Present (04/27/2024)   [employer] of Occupational Health - Occupational Stress Questionnaire    Feeling of Stress: Not at all  Social Connections: Moderately Integrated (04/27/2024)   Social Connection and Isolation Panel    Frequency of Communication with Friends and Family: Three times a week    Frequency of Social Gatherings with Friends and Family: Three times a week    Attends Religious Services: 1 to 4 times per year    Active Member of Clubs or Organizations: No    Attends Banker Meetings: Never    Marital Status: Married    Tobacco Counseling Counseling given: No Tobacco comments: smoked 1968-69, up to 1/2 ppd    Clinical Intake:  Pre-visit preparation completed: Yes  Pain : No/denies pain     BMI - recorded: 34.87 Nutritional Status: BMI > 30  Obese Nutritional Risks: None Diabetes: No  Lab Results  Component Value Date   HGBA1C 6.5 12/25/2023   HGBA1C 5.9 06/19/2023   HGBA1C 5.9 05/30/2022     How often do you need to have someone help you when you read instructions, pamphlets, or other written materials from your doctor or pharmacy?: 1 - Never  Interpreter Needed?: No  Information entered by :: Richard Trevino, CMA   Activities of Daily Living     04/27/2024   11:51 AM  In your present state of health, do you have any difficulty performing the following activities:  Hearing? 0  Vision? 0  Difficulty concentrating or making decisions? 0  Walking or climbing stairs? 0  Dressing or bathing? 0  Doing errands, shopping? 0  Preparing Food and eating ? N  Using the Toilet? N   In the past six months, have you accidently leaked urine? N  Do you have problems with loss of bowel control? N  Managing your Medications? N  Managing your Finances? N  Housekeeping or managing your Housekeeping? N    Patient Care Team: Geofm Glade PARAS, MD as PCP - General (Internal Medicine) Fate Morna SAILOR, [facility] (Inactive) as Pharmacist (Pharmacist) [facility] as Consulting Physician (Optometry) Geroge Iha, OD (Optometry)  I have updated your Care Teams any recent Medical Services you may have received from other providers in the past year.     Assessment:   This is a routine wellness examination for Richard Trevino.  Hearing/Vision screen Hearing Screening - Comments:: Denies hearing difficulties   Vision Screening - Comments:: Wears rx glasses - referral to Dr Iha Geroge   Goals Addressed               This Visit's Progress     Patient Stated (pt-stated)        Patient stated that he plans to continue working in the yard and taking medication daily       Depression Screen     04/27/2024   11:52 AM 09/06/2023    3:16 PM 06/19/2023    2:20 PM 04/24/2023   12:51 PM 04/20/2022    2:40 PM 04/20/2022    2:37 PM 04/20/2022    2:35 PM  PHQ 2/9 Scores  PHQ - 2 Score 0 0 5 0 0 0 0  PHQ- 9 Score 0 0 20 0       Fall Risk     04/27/2024   11:52 AM 09/06/2023    3:15 PM 06/19/2023  2:19 PM 04/24/2023   12:52 PM 04/20/2022    2:37 PM  Fall Risk   Falls in the past year? 0 1 0 0 0  Number falls in past yr: 0 0 0 0 0  Injury with Fall? 0 1 0 0 0  Risk for fall due to : No Fall Risks No Fall Risks No Fall Risks No Fall Risks   Follow up Falls evaluation completed;Falls prevention discussed Falls evaluation completed Falls evaluation completed Falls prevention discussed Falls evaluation completed;Education provided      Data saved with a previous flowsheet row definition    MEDICARE RISK AT HOME:  Medicare Risk at Home Any stairs in or around the home?: Yes  (outside) If so, are there any without handrails?: No Home free of loose throw rugs in walkways, pet beds, electrical cords, etc?: Yes Adequate lighting in your home to reduce risk of falls?: Yes Life alert?: No Use of a cane, walker or w/c?: No Grab bars in the bathroom?: Yes Shower chair or bench in shower?: Yes Elevated toilet seat or a handicapped toilet?: Yes  TIMED UP AND GO:  Was the test performed?  No  Cognitive Function: 6CIT completed        04/27/2024   11:54 AM 04/24/2023   12:53 PM  6CIT Screen  What Year? 0 points 0 points  What month? 0 points 0 points  What time? 0 points 0 points  Count back from 20 0 points 0 points  Months in reverse 0 points 0 points  Repeat phrase 2 points 0 points  Total Score 2 points 0 points    Immunizations Immunization History  Administered Date(s) Administered   Janssen (J&J) SARS-COV-2 Vaccination 01/13/2020   Td 03/30/2005   Tdap 06/20/2015    Screening Tests Health Maintenance  Topic Date Due   Colonoscopy  09/02/2022   Pneumococcal Vaccine: 50+ Years (1 of 2 - PCV) 06/18/2024 (Originally 06/11/1970)   Zoster Vaccines- Shingrix (1 of 2) 08/26/2054 (Originally 06/10/2002)   INFLUENZA VACCINE  05/01/2024   Medicare Annual Wellness (AWV)  04/27/2025   DTaP/Tdap/Td (3 - Td or Tdap) 06/19/2025   Hepatitis C Screening  Completed   Hepatitis B Vaccines  Aged Out   HPV VACCINES  Aged Out   Meningococcal B Vaccine  Aged Out   COVID-19 Vaccine  Discontinued    Health Maintenance  Health Maintenance Due  Topic Date Due   Colonoscopy  09/02/2022   Health Maintenance Items Addressed: I have recommended that this patient have a immunization for Pneumonia and Shingles but he declines at this time. I have discussed the risks and benefits of this procedure with him. The patient verbalizes understanding.   Additional Screening:  Vision Screening: Recommended annual ophthalmology exams for early detection of glaucoma and  other disorders of the eye. Would you like a referral to an eye doctor? No    Dental Screening: Recommended annual dental exams for proper oral hygiene  Community Resource Referral / Chronic Care Management: CRR required this visit?  No   CCM required this visit?  No   Plan:    I have personally reviewed and noted the following in the patient's chart:   Medical and social history Use of alcohol, tobacco or illicit drugs  Current medications and supplements including opioid prescriptions. Patient is not currently taking opioid prescriptions. Functional ability and status Nutritional status Physical activity Advanced directives List of other physicians Hospitalizations, surgeries, and ER visits in previous 12 months Vitals  Screenings to include cognitive, depression, and falls Referrals and appointments  In addition, I have reviewed and discussed with patient certain preventive protocols, quality metrics, and best practice recommendations. A written personalized care plan for preventive services as well as general preventive health recommendations were provided to patient.   Richard Trevino, CMA   04/27/2024   After Visit Summary: (In Person-Declined) Patient declined AVS at this time.  Notes: Nothing significant to report at this time.

## 2024-05-22 ENCOUNTER — Other Ambulatory Visit: Payer: Self-pay | Admitting: Internal Medicine

## 2024-06-10 ENCOUNTER — Other Ambulatory Visit: Payer: Self-pay | Admitting: Internal Medicine

## 2024-06-18 ENCOUNTER — Encounter: Payer: Self-pay | Admitting: Internal Medicine

## 2024-06-18 NOTE — Patient Instructions (Addendum)
      Blood work was ordered.       Medications changes include :   ciclopirox  ointment for toe      Return in about 6 months (around 12/17/2024) for Physical Exam.

## 2024-06-18 NOTE — Progress Notes (Unsigned)
 Subjective:    Patient ID: Richard Trevino, male    DOB: [birth year], 73 y.o.   MRN: [id number]     HPI Richard Trevino is here for follow up of his chronic medical problems.   Not exercising.  He can sleep all night, eat breakfast and then gets very sleepy.   He takes a nap once a nap for an hour or hour and a half.  Then he is good.  He does sneeze and does wake up choking at night.  He has ever had a sleep apnea test.  He does not want 1.  Has blurry vision in the afternoon.  It is not daily.  He made an appt for his eye doctor.  It feels like his eyes are closing and getting blurry.    He has cut back on his eating.    Medications and allergies reviewed with patient and updated if appropriate.  Current Outpatient Medications on File Prior to Visit  Medication Sig Dispense Refill   acetaminophen  (TYLENOL ) 650 MG CR tablet Take 650 mg by mouth every 8 (eight) hours as needed for pain.     aspirin 325 MG EC tablet Take 325 mg by mouth daily.     Biotin 89999 MCG TABS Take by mouth.     Calcium  Carb-Magnesium  Carb (MAGNEBIND 400) 80-115 MG TABS Take 1 tablet by mouth daily.     Cholecalciferol (VITAMIN D3) 1000 UNITS CAPS Take by mouth daily.     Cholecalciferol 25 MCG (1000 UT) tablet Take by mouth.     Coenzyme Q10 (COQ10) 50 MG CAPS Take by mouth daily.     Cyanocobalamin (VITAMIN B 12 PO) Take by mouth.     diltiazem (TIAZAC) 180 MG 24 hr capsule Take 1 capsule by mouth daily.     Flaxseed, Linseed, (FLAX SEED OIL) 1000 MG CAPS Take by mouth.     FLUoxetine  (PROZAC ) 40 MG capsule TAKE 1 CAPSULE BY MOUTH DAILY 90 capsule 3   Glucosamine Sulfate 1000 MG TABS Take by mouth. 2 by mouth daily     hydrochlorothiazide  (HYDRODIURIL ) 25 MG tablet TAKE A HALF TABLET BY MOUTH DAILY 45 tablet 0   losartan  (COZAAR ) 25 MG tablet Take 25 mg by mouth daily.      Magnesium  400 MG TABS Take by mouth.     Melatonin 10 MG TABS Take by mouth.     metoprolol tartrate (LOPRESSOR) 25 MG tablet  Take 12.5 mg by mouth 2 (two) times daily.      Multiple Vitamin (MULTIVITAMIN) tablet Take 1 tablet by mouth daily.     pantoprazole  (PROTONIX ) 40 MG tablet TAKE 1 TABLET BY MOUTH 30 MINUTES BEFORE BREAKFAST 30 tablet 0   ranolazine (RANEXA) 500 MG 12 hr tablet Take 500 mg by mouth 2 (two) times daily.      rosuvastatin  (CRESTOR ) 20 MG tablet Take 1 tablet (20 mg total) by mouth daily. 90 tablet 3   tamsulosin  (FLOMAX ) 0.4 MG CAPS capsule TAKE 1 CAPSULE BY MOUTH DAILY 90 capsule 1   VITAMIN E PO Take by mouth.     Zinc 25 MG TABS Take by mouth.     fenofibrate  (TRICOR ) 145 MG tablet TAKE 1 TABLET BY MOUTH DAILY 30 tablet 0   No current facility-administered medications on file prior to visit.     Review of Systems  Constitutional:  Negative for fever.  HENT:  Negative for trouble swallowing.   Respiratory:  Positive for shortness  of breath (with strenuous exertion). Negative for cough and wheezing.   Cardiovascular:  Negative for chest pain, palpitations and leg swelling.  Gastrointestinal:  Negative for abdominal pain.       Gerd controlled  Neurological:  Negative for light-headedness and headaches.       Objective:   Vitals:   06/19/24 1300  BP: 128/70  Pulse: 63  Temp: 98.1 F (36.7 C)  SpO2: 94%   BP Readings from Last 3 Encounters:  06/19/24 128/70  04/27/24 138/80  12/18/23 132/80   Wt Readings from Last 3 Encounters:  06/19/24 258 lb (117 kg)  04/27/24 250 lb (113.4 kg)  12/18/23 253 lb (114.8 kg)   Body mass index is 35.98 kg/m.    Physical Exam Constitutional:      General: He is not in acute distress.    Appearance: Normal appearance. He is not ill-appearing.  HENT:     Head: Normocephalic and atraumatic.  Eyes:     Conjunctiva/sclera: Conjunctivae normal.  Cardiovascular:     Rate and Rhythm: Normal rate and regular rhythm.     Heart sounds: Normal heart sounds.  Pulmonary:     Effort: Pulmonary effort is normal. No respiratory distress.      Breath sounds: Normal breath sounds. No wheezing or rales.  Musculoskeletal:     Right lower leg: No edema.     Left lower leg: No edema.  Skin:    General: Skin is warm and dry.     Findings: No rash.  Neurological:     Mental Status: He is alert. Mental status is at baseline.  Psychiatric:        Mood and Affect: Mood normal.        Lab Results  Component Value Date   WBC 5.3 12/25/2023   HGB 15.4 12/25/2023   HCT 45.2 12/25/2023   PLT 190.0 12/25/2023   GLUCOSE 132 (H) 12/25/2023   CHOL 154 02/19/2024   TRIG 400.0 (H) 02/19/2024   HDL 22.30 (L) 02/19/2024   LDLDIRECT 129.0 12/25/2023   LDLCALC 52 02/19/2024   ALT 24 02/19/2024   AST 39 (H) 02/19/2024   NA 139 12/25/2023   K 4.6 12/25/2023   CL 105 12/25/2023   CREATININE 1.56 (H) 12/25/2023   BUN 22 12/25/2023   CO2 28 12/25/2023   TSH 3.72 06/19/2023   PSA 0.67 06/19/2023   HGBA1C 6.5 12/25/2023   MICROALBUR 0.3 04/21/2007     Assessment & Plan:    See Problem List for Assessment and Plan of chronic medical problems.

## 2024-06-19 ENCOUNTER — Ambulatory Visit: Admitting: Internal Medicine

## 2024-06-19 VITALS — BP 128/70 | HR 63 | Temp 98.1°F | Ht 71.0 in | Wt 258.0 lb

## 2024-06-19 DIAGNOSIS — F3289 Other specified depressive episodes: Secondary | ICD-10-CM | POA: Diagnosis not present

## 2024-06-19 DIAGNOSIS — E785 Hyperlipidemia, unspecified: Secondary | ICD-10-CM

## 2024-06-19 DIAGNOSIS — I10 Essential (primary) hypertension: Secondary | ICD-10-CM

## 2024-06-19 DIAGNOSIS — E1169 Type 2 diabetes mellitus with other specified complication: Secondary | ICD-10-CM

## 2024-06-19 DIAGNOSIS — Z6835 Body mass index (BMI) 35.0-35.9, adult: Secondary | ICD-10-CM

## 2024-06-19 DIAGNOSIS — I480 Paroxysmal atrial fibrillation: Secondary | ICD-10-CM

## 2024-06-19 DIAGNOSIS — K219 Gastro-esophageal reflux disease without esophagitis: Secondary | ICD-10-CM

## 2024-06-19 DIAGNOSIS — E1122 Type 2 diabetes mellitus with diabetic chronic kidney disease: Secondary | ICD-10-CM | POA: Diagnosis not present

## 2024-06-19 DIAGNOSIS — N1831 Chronic kidney disease, stage 3a: Secondary | ICD-10-CM

## 2024-06-19 DIAGNOSIS — N401 Enlarged prostate with lower urinary tract symptoms: Secondary | ICD-10-CM

## 2024-06-19 LAB — COMPREHENSIVE METABOLIC PANEL WITH GFR
ALT: 34 U/L (ref 0–53)
AST: 45 U/L — ABNORMAL HIGH (ref 0–37)
Albumin: 4.4 g/dL (ref 3.5–5.2)
Alkaline Phosphatase: 81 U/L (ref 39–117)
BUN: 22 mg/dL (ref 6–23)
CO2: 25 meq/L (ref 19–32)
Calcium: 9.7 mg/dL (ref 8.4–10.5)
Chloride: 104 meq/L (ref 96–112)
Creatinine, Ser: 1.11 mg/dL (ref 0.40–1.50)
GFR: 66.57 mL/min (ref >60.00)
Glucose, Bld: 116 mg/dL — ABNORMAL HIGH (ref 70–99)
Potassium: 3.9 meq/L (ref 3.5–5.1)
Sodium: 138 meq/L (ref 135–145)
Total Bilirubin: 1.8 mg/dL — ABNORMAL HIGH (ref 0.2–1.2)
Total Protein: 7 g/dL (ref 6.0–8.3)

## 2024-06-19 LAB — LIPID PANEL
Cholesterol: 128 mg/dL (ref 0–200)
HDL: 32.7 mg/dL — ABNORMAL LOW (ref >39.00)
LDL Cholesterol: 18 mg/dL (ref 0–99)
NonHDL: 95.21
Total CHOL/HDL Ratio: 4
Triglycerides: 388 mg/dL — ABNORMAL HIGH (ref 0.0–149.0)
VLDL: 77.6 mg/dL — ABNORMAL HIGH (ref 0.0–40.0)

## 2024-06-19 LAB — CBC WITH DIFFERENTIAL/PLATELET
Basophils Absolute: 0 K/uL (ref 0.0–0.1)
Basophils Relative: 0.2 % (ref 0.0–3.0)
Eosinophils Absolute: 0.1 K/uL (ref 0.0–0.7)
Eosinophils Relative: 1.9 % (ref 0.0–5.0)
HCT: 44.9 % (ref 39.0–52.0)
Hemoglobin: 15.4 g/dL (ref 13.0–17.0)
Lymphocytes Relative: 19.2 % (ref 12.0–46.0)
Lymphs Abs: 1.1 K/uL (ref 0.7–4.0)
MCHC: 34.4 g/dL (ref 30.0–36.0)
MCV: 93 fl (ref 78.0–100.0)
Monocytes Absolute: 0.5 K/uL (ref 0.1–1.0)
Monocytes Relative: 9.2 % (ref 3.0–12.0)
Neutro Abs: 3.8 K/uL (ref 1.4–7.7)
Neutrophils Relative %: 69.5 % (ref 43.0–77.0)
Platelets: 175 K/uL (ref 150.0–400.0)
RBC: 4.82 Mil/uL (ref 4.22–5.81)
RDW: 12.4 % (ref 11.5–15.5)
WBC: 5.5 K/uL (ref 4.0–10.5)

## 2024-06-19 LAB — MICROALBUMIN / CREATININE URINE RATIO
Creatinine,U: 108.5 mg/dL
Microalb Creat Ratio: UNDETERMINED mg/g (ref 0.0–30.0)
Microalb, Ur: <0.7 mg/dL

## 2024-06-19 LAB — HEMOGLOBIN A1C: Hgb A1c MFr Bld: 6.2 % (ref 4.6–6.5)

## 2024-06-19 NOTE — Assessment & Plan Note (Signed)
 Chronic Following with cardiology ZIO monitor showed no arrhythmias Eliquis discontinued Continued on diltiazem 180 mg daily and metoprolol 12.5 mg twice daily CBC, CMP

## 2024-06-19 NOTE — Assessment & Plan Note (Addendum)
 Chronic GFR has decreased over the last couple of years-stage III a currently Stressed good sugar control, blood pressure control Advise healthy diet, regular exercise, increase water intake Avoid NSAIDs CBC, CMP

## 2024-06-19 NOTE — Assessment & Plan Note (Signed)
 Chronic Regular exercise and healthy diet encouraged Check lipid panel, cmp  Continue Crestor  20 mg daily

## 2024-06-19 NOTE — Assessment & Plan Note (Signed)
 Chronic Blood pressure well controlled CMP, CBC Continue diltiazem 180 mg daily, HCTZ 25 mg daily, losartan  25 mg daily, metoprolol 12.5 mg twice daily

## 2024-06-19 NOTE — Assessment & Plan Note (Signed)
 Chronic GERD controlled Continue pantoprazole 40 mg daily

## 2024-06-19 NOTE — Assessment & Plan Note (Signed)
 Chronic Lab Results  Component Value Date   HGBA1C 6.5 12/25/2023   Check a1c, urine albumin/creatinine ratio Consider SGLT2 medication Low sugar / carb diet Stressed regular exercise

## 2024-06-19 NOTE — Assessment & Plan Note (Signed)
 Chronic BMI 35.98 With DM, hld and htn Encouraged weight loss Encouraged stressed regular exercise Discussed decreased portions, decrease carbs

## 2024-06-19 NOTE — Assessment & Plan Note (Signed)
 Chronic Controlled, Stable Continue fluoxetine 40 mg daily

## 2024-06-19 NOTE — Assessment & Plan Note (Signed)
 Chronic Has some difficulty with urination/emptying bladder - and increased nocturia No longer seeing urology Continue tamsulosin  0.4 mg daily

## 2024-06-20 ENCOUNTER — Other Ambulatory Visit: Payer: Self-pay | Admitting: Internal Medicine

## 2024-06-20 ENCOUNTER — Ambulatory Visit: Payer: Self-pay | Admitting: Internal Medicine

## 2024-07-09 DIAGNOSIS — K080 Exfoliation of teeth due to systemic causes: Secondary | ICD-10-CM | POA: Diagnosis not present

## 2024-07-20 DIAGNOSIS — H43391 Other vitreous opacities, right eye: Secondary | ICD-10-CM | POA: Diagnosis not present

## 2024-08-19 ENCOUNTER — Other Ambulatory Visit: Payer: Self-pay | Admitting: Internal Medicine

## 2024-08-26 DIAGNOSIS — Z08 Encounter for follow-up examination after completed treatment for malignant neoplasm: Secondary | ICD-10-CM | POA: Diagnosis not present

## 2024-08-26 DIAGNOSIS — Z85828 Personal history of other malignant neoplasm of skin: Secondary | ICD-10-CM | POA: Diagnosis not present

## 2024-08-26 DIAGNOSIS — W908XXS Exposure to other nonionizing radiation, sequela: Secondary | ICD-10-CM | POA: Diagnosis not present

## 2024-08-26 DIAGNOSIS — L578 Other skin changes due to chronic exposure to nonionizing radiation: Secondary | ICD-10-CM | POA: Diagnosis not present

## 2024-08-26 DIAGNOSIS — L570 Actinic keratosis: Secondary | ICD-10-CM | POA: Diagnosis not present

## 2024-09-13 ENCOUNTER — Other Ambulatory Visit: Payer: Self-pay | Admitting: Internal Medicine

## 2025-05-04 ENCOUNTER — Ambulatory Visit
